# Patient Record
Sex: Male | Born: 1935
Health system: Southern US, Community
[De-identification: ages and names within clinical notes are randomized; demographics above are authoritative.]

## PROBLEM LIST (undated history)

## (undated) DIAGNOSIS — I509 Heart failure, unspecified: Secondary | ICD-10-CM

## (undated) DIAGNOSIS — I10 Essential (primary) hypertension: Secondary | ICD-10-CM

## (undated) DIAGNOSIS — I4821 Permanent atrial fibrillation: Secondary | ICD-10-CM

## (undated) DIAGNOSIS — E782 Mixed hyperlipidemia: Secondary | ICD-10-CM

## (undated) DIAGNOSIS — E119 Type 2 diabetes mellitus without complications: Secondary | ICD-10-CM

## (undated) HISTORY — PX: APPENDECTOMY: SHX54

## (undated) HISTORY — PX: INSERT / REPLACE / REMOVE PACEMAKER: SUR710

---

## 2017-01-21 ENCOUNTER — Other Ambulatory Visit
Admission: RE | Admit: 2017-01-21 | Discharge: 2017-01-21 | Disposition: A | Discharge status: Home / Self Care | Payer: Medicare HMO | Source: Ambulatory Visit | Attending: Family Medicine | Admitting: Family Medicine

## 2017-01-21 DIAGNOSIS — I482 Chronic atrial fibrillation: Secondary | ICD-10-CM | POA: Diagnosis not present

## 2017-01-21 LAB — PROTIME-INR
INR: 2.03
PROTHROMBIN TIME: 22.8 s — AB (ref 11.4–15.2)

## 2017-03-09 DIAGNOSIS — T161XXA Foreign body in right ear, initial encounter: Secondary | ICD-10-CM | POA: Diagnosis not present

## 2017-03-09 DIAGNOSIS — T169XXA Foreign body in ear, unspecified ear, initial encounter: Secondary | ICD-10-CM | POA: Diagnosis not present

## 2017-03-09 DIAGNOSIS — T162XXA Foreign body in left ear, initial encounter: Secondary | ICD-10-CM | POA: Diagnosis not present

## 2017-03-19 DIAGNOSIS — I509 Heart failure, unspecified: Secondary | ICD-10-CM | POA: Diagnosis not present

## 2017-03-19 DIAGNOSIS — I10 Essential (primary) hypertension: Secondary | ICD-10-CM | POA: Diagnosis not present

## 2017-03-19 DIAGNOSIS — I110 Hypertensive heart disease with heart failure: Secondary | ICD-10-CM | POA: Diagnosis not present

## 2017-03-19 DIAGNOSIS — E1165 Type 2 diabetes mellitus with hyperglycemia: Secondary | ICD-10-CM | POA: Diagnosis not present

## 2017-03-19 DIAGNOSIS — I4892 Unspecified atrial flutter: Secondary | ICD-10-CM | POA: Diagnosis not present

## 2017-03-19 DIAGNOSIS — M1990 Unspecified osteoarthritis, unspecified site: Secondary | ICD-10-CM | POA: Diagnosis not present

## 2017-03-19 DIAGNOSIS — I4891 Unspecified atrial fibrillation: Secondary | ICD-10-CM | POA: Diagnosis not present

## 2017-03-19 DIAGNOSIS — E782 Mixed hyperlipidemia: Secondary | ICD-10-CM | POA: Diagnosis not present

## 2017-03-25 DIAGNOSIS — I4892 Unspecified atrial flutter: Secondary | ICD-10-CM | POA: Diagnosis not present

## 2017-03-25 DIAGNOSIS — I4891 Unspecified atrial fibrillation: Secondary | ICD-10-CM | POA: Diagnosis not present

## 2017-04-05 DIAGNOSIS — M79675 Pain in left toe(s): Secondary | ICD-10-CM | POA: Diagnosis not present

## 2017-04-05 DIAGNOSIS — I4891 Unspecified atrial fibrillation: Secondary | ICD-10-CM | POA: Diagnosis not present

## 2017-04-05 DIAGNOSIS — M79674 Pain in right toe(s): Secondary | ICD-10-CM | POA: Diagnosis not present

## 2017-04-05 DIAGNOSIS — B351 Tinea unguium: Secondary | ICD-10-CM | POA: Diagnosis not present

## 2017-04-05 DIAGNOSIS — L600 Ingrowing nail: Secondary | ICD-10-CM | POA: Diagnosis not present

## 2017-04-05 DIAGNOSIS — I4892 Unspecified atrial flutter: Secondary | ICD-10-CM | POA: Diagnosis not present

## 2017-04-05 DIAGNOSIS — E119 Type 2 diabetes mellitus without complications: Secondary | ICD-10-CM | POA: Diagnosis not present

## 2017-05-06 DIAGNOSIS — I4892 Unspecified atrial flutter: Secondary | ICD-10-CM | POA: Diagnosis not present

## 2017-05-06 DIAGNOSIS — I4891 Unspecified atrial fibrillation: Secondary | ICD-10-CM | POA: Diagnosis not present

## 2017-06-16 DIAGNOSIS — E1165 Type 2 diabetes mellitus with hyperglycemia: Secondary | ICD-10-CM | POA: Diagnosis not present

## 2017-06-16 DIAGNOSIS — I4891 Unspecified atrial fibrillation: Secondary | ICD-10-CM | POA: Diagnosis not present

## 2017-06-16 DIAGNOSIS — I4892 Unspecified atrial flutter: Secondary | ICD-10-CM | POA: Diagnosis not present

## 2017-06-23 DIAGNOSIS — I10 Essential (primary) hypertension: Secondary | ICD-10-CM | POA: Diagnosis not present

## 2017-06-23 DIAGNOSIS — E1165 Type 2 diabetes mellitus with hyperglycemia: Secondary | ICD-10-CM | POA: Diagnosis not present

## 2017-06-23 DIAGNOSIS — I4892 Unspecified atrial flutter: Secondary | ICD-10-CM | POA: Diagnosis not present

## 2017-06-23 DIAGNOSIS — Z0000 Encounter for general adult medical examination without abnormal findings: Secondary | ICD-10-CM | POA: Diagnosis not present

## 2017-06-23 DIAGNOSIS — E782 Mixed hyperlipidemia: Secondary | ICD-10-CM | POA: Diagnosis not present

## 2017-06-23 DIAGNOSIS — I4891 Unspecified atrial fibrillation: Secondary | ICD-10-CM | POA: Diagnosis not present

## 2017-07-19 DIAGNOSIS — I4891 Unspecified atrial fibrillation: Secondary | ICD-10-CM | POA: Diagnosis not present

## 2017-07-19 DIAGNOSIS — I4892 Unspecified atrial flutter: Secondary | ICD-10-CM | POA: Diagnosis not present

## 2017-08-26 DIAGNOSIS — M79675 Pain in left toe(s): Secondary | ICD-10-CM | POA: Diagnosis not present

## 2017-08-26 DIAGNOSIS — I4891 Unspecified atrial fibrillation: Secondary | ICD-10-CM | POA: Diagnosis not present

## 2017-08-26 DIAGNOSIS — B351 Tinea unguium: Secondary | ICD-10-CM | POA: Diagnosis not present

## 2017-08-26 DIAGNOSIS — I4892 Unspecified atrial flutter: Secondary | ICD-10-CM | POA: Diagnosis not present

## 2017-08-26 DIAGNOSIS — M79674 Pain in right toe(s): Secondary | ICD-10-CM | POA: Diagnosis not present

## 2017-09-06 DIAGNOSIS — I4892 Unspecified atrial flutter: Secondary | ICD-10-CM | POA: Diagnosis not present

## 2017-09-06 DIAGNOSIS — I4891 Unspecified atrial fibrillation: Secondary | ICD-10-CM | POA: Diagnosis not present

## 2017-09-30 DIAGNOSIS — E1165 Type 2 diabetes mellitus with hyperglycemia: Secondary | ICD-10-CM | POA: Diagnosis not present

## 2017-10-06 DIAGNOSIS — E782 Mixed hyperlipidemia: Secondary | ICD-10-CM | POA: Diagnosis not present

## 2017-10-06 DIAGNOSIS — I10 Essential (primary) hypertension: Secondary | ICD-10-CM | POA: Diagnosis not present

## 2017-10-06 DIAGNOSIS — I4892 Unspecified atrial flutter: Secondary | ICD-10-CM | POA: Diagnosis not present

## 2017-10-06 DIAGNOSIS — I4891 Unspecified atrial fibrillation: Secondary | ICD-10-CM | POA: Diagnosis not present

## 2017-10-06 DIAGNOSIS — E1165 Type 2 diabetes mellitus with hyperglycemia: Secondary | ICD-10-CM | POA: Diagnosis not present

## 2017-10-06 DIAGNOSIS — R001 Bradycardia, unspecified: Secondary | ICD-10-CM | POA: Diagnosis not present

## 2017-10-11 DIAGNOSIS — I4892 Unspecified atrial flutter: Secondary | ICD-10-CM | POA: Diagnosis not present

## 2017-10-11 DIAGNOSIS — I4891 Unspecified atrial fibrillation: Secondary | ICD-10-CM | POA: Diagnosis not present

## 2017-10-11 DIAGNOSIS — I10 Essential (primary) hypertension: Secondary | ICD-10-CM | POA: Diagnosis not present

## 2017-10-11 DIAGNOSIS — R001 Bradycardia, unspecified: Secondary | ICD-10-CM | POA: Diagnosis not present

## 2017-10-25 DIAGNOSIS — R001 Bradycardia, unspecified: Secondary | ICD-10-CM | POA: Diagnosis not present

## 2017-10-25 DIAGNOSIS — I10 Essential (primary) hypertension: Secondary | ICD-10-CM | POA: Diagnosis not present

## 2017-10-25 DIAGNOSIS — E1165 Type 2 diabetes mellitus with hyperglycemia: Secondary | ICD-10-CM | POA: Diagnosis not present

## 2017-10-25 DIAGNOSIS — E782 Mixed hyperlipidemia: Secondary | ICD-10-CM | POA: Diagnosis not present

## 2017-10-25 DIAGNOSIS — I4892 Unspecified atrial flutter: Secondary | ICD-10-CM | POA: Diagnosis not present

## 2017-10-25 DIAGNOSIS — I4891 Unspecified atrial fibrillation: Secondary | ICD-10-CM | POA: Diagnosis not present

## 2017-11-11 DIAGNOSIS — I4891 Unspecified atrial fibrillation: Secondary | ICD-10-CM | POA: Diagnosis not present

## 2017-11-11 DIAGNOSIS — I4892 Unspecified atrial flutter: Secondary | ICD-10-CM | POA: Diagnosis not present

## 2017-11-15 DIAGNOSIS — I4892 Unspecified atrial flutter: Secondary | ICD-10-CM | POA: Diagnosis not present

## 2017-11-15 DIAGNOSIS — I4891 Unspecified atrial fibrillation: Secondary | ICD-10-CM | POA: Diagnosis not present

## 2017-12-08 DIAGNOSIS — H524 Presbyopia: Secondary | ICD-10-CM | POA: Diagnosis not present

## 2017-12-16 DIAGNOSIS — I4892 Unspecified atrial flutter: Secondary | ICD-10-CM | POA: Diagnosis not present

## 2017-12-16 DIAGNOSIS — I4891 Unspecified atrial fibrillation: Secondary | ICD-10-CM | POA: Diagnosis not present

## 2017-12-17 DIAGNOSIS — I4892 Unspecified atrial flutter: Secondary | ICD-10-CM | POA: Diagnosis not present

## 2017-12-17 DIAGNOSIS — I4891 Unspecified atrial fibrillation: Secondary | ICD-10-CM | POA: Diagnosis not present

## 2017-12-20 DIAGNOSIS — I4891 Unspecified atrial fibrillation: Secondary | ICD-10-CM | POA: Diagnosis not present

## 2017-12-20 DIAGNOSIS — I4892 Unspecified atrial flutter: Secondary | ICD-10-CM | POA: Diagnosis not present

## 2018-01-03 DIAGNOSIS — I4892 Unspecified atrial flutter: Secondary | ICD-10-CM | POA: Diagnosis not present

## 2018-01-03 DIAGNOSIS — E1165 Type 2 diabetes mellitus with hyperglycemia: Secondary | ICD-10-CM | POA: Diagnosis not present

## 2018-01-03 DIAGNOSIS — I4891 Unspecified atrial fibrillation: Secondary | ICD-10-CM | POA: Diagnosis not present

## 2018-01-07 DIAGNOSIS — I4891 Unspecified atrial fibrillation: Secondary | ICD-10-CM | POA: Diagnosis not present

## 2018-01-07 DIAGNOSIS — I4892 Unspecified atrial flutter: Secondary | ICD-10-CM | POA: Diagnosis not present

## 2018-01-13 DIAGNOSIS — I10 Essential (primary) hypertension: Secondary | ICD-10-CM | POA: Diagnosis not present

## 2018-01-13 DIAGNOSIS — E782 Mixed hyperlipidemia: Secondary | ICD-10-CM | POA: Diagnosis not present

## 2018-01-13 DIAGNOSIS — R413 Other amnesia: Secondary | ICD-10-CM | POA: Diagnosis not present

## 2018-01-13 DIAGNOSIS — I4891 Unspecified atrial fibrillation: Secondary | ICD-10-CM | POA: Diagnosis not present

## 2018-01-13 DIAGNOSIS — M1990 Unspecified osteoarthritis, unspecified site: Secondary | ICD-10-CM | POA: Diagnosis not present

## 2018-01-13 DIAGNOSIS — E1165 Type 2 diabetes mellitus with hyperglycemia: Secondary | ICD-10-CM | POA: Diagnosis not present

## 2018-01-13 DIAGNOSIS — I4892 Unspecified atrial flutter: Secondary | ICD-10-CM | POA: Diagnosis not present

## 2018-01-17 DIAGNOSIS — I4892 Unspecified atrial flutter: Secondary | ICD-10-CM | POA: Diagnosis not present

## 2018-01-17 DIAGNOSIS — I4891 Unspecified atrial fibrillation: Secondary | ICD-10-CM | POA: Diagnosis not present

## 2018-02-18 DIAGNOSIS — R791 Abnormal coagulation profile: Secondary | ICD-10-CM | POA: Diagnosis not present

## 2018-03-21 DIAGNOSIS — I4892 Unspecified atrial flutter: Secondary | ICD-10-CM | POA: Diagnosis not present

## 2018-03-21 DIAGNOSIS — I4891 Unspecified atrial fibrillation: Secondary | ICD-10-CM | POA: Diagnosis not present

## 2018-03-23 ENCOUNTER — Emergency Department: Payer: Medicare HMO

## 2018-03-23 ENCOUNTER — Other Ambulatory Visit: Payer: Self-pay

## 2018-03-23 ENCOUNTER — Inpatient Hospital Stay
Admission: EM | Admit: 2018-03-23 | Discharge: 2018-03-25 | DRG: 308 | Disposition: A | Discharge status: Home Health | Payer: Medicare HMO | Attending: Internal Medicine | Admitting: Internal Medicine

## 2018-03-23 ENCOUNTER — Encounter: Payer: Self-pay | Admitting: Emergency Medicine

## 2018-03-23 DIAGNOSIS — E119 Type 2 diabetes mellitus without complications: Secondary | ICD-10-CM | POA: Diagnosis not present

## 2018-03-23 DIAGNOSIS — I509 Heart failure, unspecified: Secondary | ICD-10-CM | POA: Diagnosis not present

## 2018-03-23 DIAGNOSIS — I10 Essential (primary) hypertension: Secondary | ICD-10-CM | POA: Diagnosis not present

## 2018-03-23 DIAGNOSIS — Z7984 Long term (current) use of oral hypoglycemic drugs: Secondary | ICD-10-CM

## 2018-03-23 DIAGNOSIS — I5033 Acute on chronic diastolic (congestive) heart failure: Secondary | ICD-10-CM | POA: Diagnosis present

## 2018-03-23 DIAGNOSIS — I4821 Permanent atrial fibrillation: Secondary | ICD-10-CM | POA: Diagnosis not present

## 2018-03-23 DIAGNOSIS — I110 Hypertensive heart disease with heart failure: Secondary | ICD-10-CM | POA: Diagnosis not present

## 2018-03-23 DIAGNOSIS — Z823 Family history of stroke: Secondary | ICD-10-CM | POA: Diagnosis not present

## 2018-03-23 DIAGNOSIS — I442 Atrioventricular block, complete: Principal | ICD-10-CM | POA: Diagnosis present

## 2018-03-23 DIAGNOSIS — E782 Mixed hyperlipidemia: Secondary | ICD-10-CM | POA: Diagnosis not present

## 2018-03-23 DIAGNOSIS — N400 Enlarged prostate without lower urinary tract symptoms: Secondary | ICD-10-CM | POA: Diagnosis present

## 2018-03-23 DIAGNOSIS — I248 Other forms of acute ischemic heart disease: Secondary | ICD-10-CM | POA: Diagnosis not present

## 2018-03-23 DIAGNOSIS — Z9181 History of falling: Secondary | ICD-10-CM | POA: Diagnosis not present

## 2018-03-23 DIAGNOSIS — Z7901 Long term (current) use of anticoagulants: Secondary | ICD-10-CM | POA: Diagnosis not present

## 2018-03-23 DIAGNOSIS — R001 Bradycardia, unspecified: Secondary | ICD-10-CM

## 2018-03-23 DIAGNOSIS — I4510 Unspecified right bundle-branch block: Secondary | ICD-10-CM | POA: Diagnosis not present

## 2018-03-23 DIAGNOSIS — I455 Other specified heart block: Secondary | ICD-10-CM | POA: Diagnosis not present

## 2018-03-23 DIAGNOSIS — R0602 Shortness of breath: Secondary | ICD-10-CM | POA: Diagnosis not present

## 2018-03-23 DIAGNOSIS — I4892 Unspecified atrial flutter: Secondary | ICD-10-CM | POA: Diagnosis not present

## 2018-03-23 DIAGNOSIS — Z79899 Other long term (current) drug therapy: Secondary | ICD-10-CM

## 2018-03-23 DIAGNOSIS — Z8052 Family history of malignant neoplasm of bladder: Secondary | ICD-10-CM

## 2018-03-23 DIAGNOSIS — I4891 Unspecified atrial fibrillation: Secondary | ICD-10-CM | POA: Diagnosis not present

## 2018-03-23 HISTORY — DX: Heart failure, unspecified: I50.9

## 2018-03-23 HISTORY — DX: Type 2 diabetes mellitus without complications: E11.9

## 2018-03-23 HISTORY — DX: Essential (primary) hypertension: I10

## 2018-03-23 HISTORY — DX: Permanent atrial fibrillation: I48.21

## 2018-03-23 HISTORY — DX: Mixed hyperlipidemia: E78.2

## 2018-03-23 LAB — BRAIN NATRIURETIC PEPTIDE: B Natriuretic Peptide: 314 pg/mL — ABNORMAL HIGH (ref 0.0–100.0)

## 2018-03-23 LAB — CBC
HEMATOCRIT: 37 % — AB (ref 39.0–52.0)
HEMOGLOBIN: 11.8 g/dL — AB (ref 13.0–17.0)
MCH: 31.9 pg (ref 26.0–34.0)
MCHC: 31.9 g/dL (ref 30.0–36.0)
MCV: 100 fL (ref 80.0–100.0)
Platelets: 91 10*3/uL — ABNORMAL LOW (ref 150–400)
RBC: 3.7 MIL/uL — ABNORMAL LOW (ref 4.22–5.81)
RDW: 14.9 % (ref 11.5–15.5)
WBC: 6.5 10*3/uL (ref 4.0–10.5)
nRBC: 0 % (ref 0.0–0.2)

## 2018-03-23 LAB — BASIC METABOLIC PANEL
Anion gap: 10 (ref 5–15)
BUN: 26 mg/dL — AB (ref 8–23)
CHLORIDE: 105 mmol/L (ref 98–111)
CO2: 25 mmol/L (ref 22–32)
Calcium: 9.2 mg/dL (ref 8.9–10.3)
Creatinine, Ser: 0.99 mg/dL (ref 0.61–1.24)
GFR calc Af Amer: >60 mL/min (ref >60)
GLUCOSE: 295 mg/dL — AB (ref 70–99)
POTASSIUM: 4.1 mmol/L (ref 3.5–5.1)
Sodium: 140 mmol/L (ref 135–145)

## 2018-03-23 LAB — INFLUENZA PANEL BY PCR (TYPE A & B)
INFLBPCR: NEGATIVE
Influenza A By PCR: NEGATIVE

## 2018-03-23 LAB — TROPONIN I: Troponin I: 0.06 ng/mL (ref <0.03)

## 2018-03-23 LAB — TSH: TSH: 2.249 u[IU]/mL (ref 0.350–4.500)

## 2018-03-23 LAB — GLUCOSE, CAPILLARY: Glucose-Capillary: 257 mg/dL — ABNORMAL HIGH (ref 70–99)

## 2018-03-23 LAB — PROTIME-INR
INR: 2.49
PROTHROMBIN TIME: 26.6 s — AB (ref 11.4–15.2)

## 2018-03-23 MED ORDER — WARFARIN SODIUM 2 MG PO TABS
2.0000 mg | ORAL_TABLET | ORAL | Status: DC
  Filled 2018-03-23: qty 1

## 2018-03-23 MED ORDER — INSULIN ASPART 100 UNIT/ML ~~LOC~~ SOLN
0.0000 [IU] | Freq: Every day | SUBCUTANEOUS | Status: DC
  Administered 2018-03-23: 3 [IU] via SUBCUTANEOUS
  Administered 2018-03-24: 2 [IU] via SUBCUTANEOUS
  Filled 2018-03-23 (×2): qty 1

## 2018-03-23 MED ORDER — WARFARIN SODIUM 1 MG PO TABS: 1.0000 mg | ORAL_TABLET | ORAL | Status: DC

## 2018-03-23 MED ORDER — FUROSEMIDE 10 MG/ML IJ SOLN
40.0000 mg | Freq: Once | INTRAMUSCULAR | Status: AC
  Administered 2018-03-23: 40 mg via INTRAVENOUS
  Filled 2018-03-23: qty 4

## 2018-03-23 MED ORDER — INSULIN ASPART 100 UNIT/ML ~~LOC~~ SOLN
0.0000 [IU] | Freq: Three times a day (TID) | SUBCUTANEOUS | Status: DC
  Administered 2018-03-24: 2 [IU] via SUBCUTANEOUS
  Administered 2018-03-24: 3 [IU] via SUBCUTANEOUS
  Administered 2018-03-24: 1 [IU] via SUBCUTANEOUS
  Filled 2018-03-23 (×3): qty 1

## 2018-03-23 NOTE — ED Notes (Signed)
First nurse note: pt referred to ED by Dr. Stasia Cavalier office for new-onset CHF and HR 34 on EKG. EKG from [facility] showing idioventricular rhythm.

## 2018-03-23 NOTE — ED Notes (Signed)
Pt denies CP/dizziness. Per pt "I feel short of breath when I talk". Family a bedside.

## 2018-03-23 NOTE — Progress Notes (Signed)
Family Meeting Note  Advance Directive:yes  Today a meeting took place with the Patient.    The following clinical team members were present during this meeting:MD  The following were discussed:Patient's diagnosis: Acute CHF, sinus bradycardia with idioventricular rhythm, elevated troponin, atrial fibrillation on Coumadin, diabetes mellitus, recent history of ankle fracture on the right side status post cast, will be admitted to the hospital and treatment plan of care discussed in detail with the patient.  He verbalized understanding.    Patient's progosis: Unable to determine and Goals for treatment: Full Code  Additional follow-up to be provided: Hospitalist, cardiology  Time spent during discussion:17 min  Ramonita Lab, MD

## 2018-03-23 NOTE — ED Notes (Addendum)
Patient denies pain and is resting comfortably. Pt SpO2 at 9%. Per pt, he doesn't use oxygen at home.

## 2018-03-23 NOTE — ED Triage Notes (Signed)
PT arrives with wife after being sent by [facility] due to abnormal EKG. Pt states he went to [facility] due to shortness of breath for the last 2 days. Room air saturation 95%.

## 2018-03-23 NOTE — ED Provider Notes (Signed)
[facility] Emergency Department Provider Note ____________________________________________   First MD Initiated Contact with Patient 03/23/18 1742     (approximate)  I have reviewed the triage vital signs and the nursing notes.   HISTORY  Chief Complaint Shortness of Breath    HPI Andrew [name] is a 83 y.o. male with PMH as noted below who presents with shortness of breath, gradual onset over the last several days, worsening course, and worse with any exertion.  He denies associated chest pain, cough, or fever.  The patient reports bilateral lower extremity swelling but states that this is chronic.  He states he is compliant with all of his medications.  He went to the outpatient clinic and was referred to the ED for further evaluation.   Past Medical History:  Diagnosis Date  . CHF (congestive heart failure) (HCC)   . Diabetes mellitus without complication (HCC)     There are no active problems to display for this patient.    Prior to Admission medications   Medication Sig Start Date End Date Taking? Authorizing Provider  amLODipine (NORVASC) 2.5 MG tablet Take 2.5 mg by mouth daily. 01/06/18 01/06/19 Yes [provider]  carvedilol (COREG) 25 MG tablet Take 25 mg by mouth 2 (two) times daily with a meal.  10/25/17 10/25/18 Yes [provider]  doxazosin (CARDURA) 4 MG tablet Take 4 mg by mouth Nightly. 12/31/16  Yes [provider]  furosemide (LASIX) 80 MG tablet Take 80 mg by mouth daily. 06/29/17  Yes [provider]  glimepiride (AMARYL) 2 MG tablet Take 4 mg by mouth daily. 08/14/16  Yes [provider]  metFORMIN (GLUCOPHAGE) 1000 MG tablet Take 1,000 mg by mouth 2 (two) times daily. 04/20/17  Yes [provider]  Potassium 99 MG TABS Take 148 mg by mouth daily.   Yes [provider]  simvastatin (ZOCOR) 20 MG tablet Take 20 mg by mouth Nightly. 12/31/16  Yes [provider]    vitamin B-12 (CYANOCOBALAMIN) 1000 MCG tablet Take 1,000 mcg by mouth daily.   Yes [provider]  warfarin (COUMADIN) 2 MG tablet Take 1-2 mg by mouth daily. Monday,Tuesday, Wednesday, Thursday, Friday take 2 mg, on Saturday and Sunday take 1 mg 10/23/16  Yes [provider]  diphenhydrAMINE (BENADRYL) 25 mg capsule Take 25 mg by mouth as needed for itching.    [provider]    Allergies Methyldopa  No family history on file.  Social History Social History   Tobacco Use  . Smoking status: Not on file  Substance Use Topics  . Alcohol use: Not on file  . Drug use: Not on file    Review of Systems  Constitutional: No fever. Eyes: No redness. ENT: No sore throat. Cardiovascular: Denies chest pain. Respiratory: Positive for shortness of breath. Gastrointestinal: No vomiting or diarrhea.  Genitourinary: Negative for dysuria.  Musculoskeletal: Negative for back pain. Skin: Negative for rash. Neurological: Negative for headache.   ____________________________________________   PHYSICAL EXAM:  VITAL SIGNS: ED Triage Vitals [03/23/18 1701]  Enc Vitals Group     BP (!) 184/50     Pulse Rate (!) 48     Resp 18     Temp (!) 97.5 F (36.4 C)     Temp Source Oral     SpO2 98 %     Weight 180 lb (81.6 kg)     Height 5\' 10"  (1.778 m)     Head Circumference  Peak Flow      Pain Score 0     Pain Loc      Pain Edu?      Excl. in GC?     Constitutional: Alert and oriented.  Slightly uncomfortable appearing with increased work of breathing but in no acute distress. Eyes: Conjunctivae are normal.  Head: Atraumatic. Nose: No congestion/rhinnorhea. Mouth/Throat: Mucous membranes are moist.   Neck: Normal range of motion.  Cardiovascular: Bradycardic, irregular rhythm. Grossly normal heart sounds.  Good peripheral circulation. Respiratory: Increased respiratory effort.  Somewhat decreased breath sounds bilaterally.  No rales or  rhonchi. Gastrointestinal: Soft and nontender. No distention.  Genitourinary: No flank tenderness. Musculoskeletal: Trace bilateral lower extremity edema.  Extremities warm and well perfused.  Neurologic:  Normal speech and language. No gross focal neurologic deficits are appreciated.  Skin:  Skin is warm and dry. No rash noted. Psychiatric: Mood and affect are normal. Speech and behavior are normal.  ____________________________________________   LABS (all labs ordered are listed, but only abnormal results are displayed)  Labs Reviewed  BASIC METABOLIC PANEL - Abnormal; Notable for the following components:      Result Value   Glucose, Bld 295 (*)    BUN 26 (*)    All other components within normal limits  CBC - Abnormal; Notable for the following components:   RBC 3.70 (*)    Hemoglobin 11.8 (*)    HCT 37.0 (*)    Platelets 91 (*)    All other components within normal limits  TROPONIN I - Abnormal; Notable for the following components:   Troponin I 0.06 (*)    All other components within normal limits  BRAIN NATRIURETIC PEPTIDE - Abnormal; Notable for the following components:   B Natriuretic Peptide 314.0 (*)    All other components within normal limits  TSH  INFLUENZA PANEL BY PCR (TYPE A & B)  PROTIME-INR   ____________________________________________  EKG  ED ECG REPORT I, [name] Bucy, the attending physician, personally viewed and interpreted this ECG.  Date: 03/23/2018 EKG Time: 1706 Rate: 36 Rhythm: Junctional rhythm Intervals: RBBB ST/T Wave abnormalities: normal Narrative Interpretation: Junctional bradycardia with no evidence of acute ischemia  ____________________________________________  RADIOLOGY  CXR: Vascular congestion with no overt edema  ____________________________________________   PROCEDURES  Procedure(s) performed: No  Procedures  Critical Care performed: Yes  CRITICAL CARE Performed by: [name] Bucy   Total  critical care time: 40 minutes  Critical care time was exclusive of separately billable procedures and treating other patients.  Critical care was necessary to treat or prevent imminent or life-threatening deterioration.  Critical care was time spent personally by me on the following activities: development of treatment plan with patient and/or surrogate as well as nursing, discussions with consultants, evaluation of patient's response to treatment, examination of patient, obtaining history from patient or surrogate, ordering and performing treatments and interventions, ordering and review of laboratory studies, ordering and review of radiographic studies, pulse oximetry and re-evaluation of patient's condition.  ____________________________________________   INITIAL IMPRESSION / ASSESSMENT AND PLAN / ED COURSE  Pertinent labs & imaging results that were available during my care of the patient were reviewed by me and considered in my medical decision making (see chart for details).  83 year old male with a history of CHF and diabetes presents with worsening shortness of breath over the last several days with no chest pain, cough, or fever.  The patient was seen at the outpatient clinic and referred into the ED.  I reviewed the past medical records in Epic.  I am able to see a note indicating the patient was seen today but not any clinical information and I do not have access in Epic or Care Everywhere to the x-ray obtained today.  On exam the patient does have increased work of breathing but no acute distress.  O2 saturation is in the low to mid 90s on room air.  He is significantly bradycardic to the 30s-40s but with elevated blood pressure.  The remainder of the exam is as described above.  Overall presentation is most consistent with acute on chronic CHF although differential also includes influenza or other viral syndrome pneumonia, or ACS.  EKG shows junctional bradycardia, and this would  be a possible contributor to the patient's acute CHF.  However at this time the patient is maintaining a blood pressure and his mental status is normal.  There is no indication for pacing.  We will obtain lab work-up, a repeat chest x-ray, and reassess.  ----------------------------------------- 8:25 PM on 03/23/2018 -----------------------------------------  The troponin is minimally elevated.  BNP is also slightly elevated.  The remainder of the lab work-up is relatively unremarkable.  The patient's vital signs are essentially unchanged, with persistent bradycardia but elevated blood pressure.  I consulted Dr. Eden Emms from cardiology.  He recommended that we hold the patient's beta-blocker and continue monitoring.  He advised that cardiology would evaluate the patient in the hospital and that the patient would likely need an echocardiogram tomorrow morning.  The patient agrees with the plan and remains comfortable.  I signed him out to the hospitalist Dr. Amado Coe.  ____________________________________________   FINAL CLINICAL IMPRESSION(S) / ED DIAGNOSES  Final diagnoses:  Junctional bradycardia  Acute on chronic congestive heart failure, unspecified heart failure type (HCC)      NEW MEDICATIONS STARTED DURING THIS VISIT:  New Prescriptions   No medications on file     Note:  This document was prepared using Dragon voice recognition software and may include unintentional dictation errors.    [name] Bucy, MD 03/23/18 2026

## 2018-03-23 NOTE — H&P (Signed)
[facility] [place] [facility]   PATIENT NAME: Andrew [name]    MR#:  [id number]  DATE OF BIRTH:  14-Jan-1936  DATE OF ADMISSION:  03/23/2018  PRIMARY CARE PHYSICIAN: [facility]   REQUESTING/REFERRING PHYSICIAN: Dionne Bucy, MD  CHIEF COMPLAINT:  Shortness of breath  HISTORY OF PRESENT ILLNESS:  Andrew [name]  is a 83 y.o. male with a known history of CHF, diabetes mellitus, history of atrial fibrillation on Coumadin, recent history of ankle fracture on the right side on cast is presenting to the ED with a chief complaint of shortness of breath.  Patient's EKG has revealed idioventricular rhythm with heart rate at 36.  Chest x-ray with vascular congestion BNP is elevated at 314 troponin is 0.06 patient takes Coumadin and INR is therapeutic.  Patient denies any chest pain.  Patient initially went outpatient clinic who referred him to emergency department.  No family members at bedside  PAST MEDICAL HISTORY:   Past Medical History:  Diagnosis Date  . CHF (congestive heart failure) (HCC)   . Diabetes mellitus without complication (HCC)   Chronic atrial fibrillation on Coumadin Anxiety  PAST SURGICAL HISTOIRY:   Ankle fracture SOCIAL HISTORY:   Social History   Tobacco Use  . Smoking status: Not on file  Substance Use Topics  . Alcohol use: Not on file    FAMILY HISTORY:  No family history on file.  DRUG ALLERGIES:   Allergies  Allergen Reactions  . Methyldopa Nausea Only and Palpitations    Other reaction(s): Unknown Other reaction(s): NAUSEA 'palpitation" Other reaction(s): NAUSEA Other reaction(s): NAUSEA     REVIEW OF SYSTEMS:  CONSTITUTIONAL: No fever, fatigue or weakness.  EYES: No blurred or double vision.  EARS, NOSE, AND THROAT: No tinnitus or ear pain.  RESPIRATORY: No cough, endorses shortness of breath, denies wheezing or hemoptysis.  CARDIOVASCULAR: No chest pain, orthopnea, edema.   GASTROINTESTINAL: No nausea, vomiting, diarrhea or abdominal pain.  GENITOURINARY: No dysuria, hematuria.  ENDOCRINE: No polyuria, nocturia,  HEMATOLOGY: No anemia, easy bruising or bleeding SKIN: No rash or lesion. MUSCULOSKELETAL: No joint pain or arthritis.   NEUROLOGIC: No tingling, numbness, weakness.  PSYCHIATRY: No anxiety or depression.   MEDICATIONS AT HOME:   Prior to Admission medications   Medication Sig Start Date End Date Taking? Authorizing Provider  amLODipine (NORVASC) 2.5 MG tablet Take 2.5 mg by mouth daily. 01/06/18 01/06/19 Yes [provider]  carvedilol (COREG) 25 MG tablet Take 25 mg by mouth 2 (two) times daily with a meal.  10/25/17 10/25/18 Yes [provider]  doxazosin (CARDURA) 4 MG tablet Take 4 mg by mouth Nightly. 12/31/16  Yes [provider]  furosemide (LASIX) 80 MG tablet Take 80 mg by mouth daily. 06/29/17  Yes [provider]  glimepiride (AMARYL) 2 MG tablet Take 4 mg by mouth daily. 08/14/16  Yes [provider]  metFORMIN (GLUCOPHAGE) 1000 MG tablet Take 1,000 mg by mouth 2 (two) times daily. 04/20/17  Yes [provider]  Potassium 99 MG TABS Take 148 mg by mouth daily.   Yes [provider]  simvastatin (ZOCOR) 20 MG tablet Take 20 mg by mouth Nightly. 12/31/16  Yes [provider]  vitamin B-12 (CYANOCOBALAMIN) 1000 MCG tablet Take 1,000 mcg by mouth daily.   Yes [provider]  warfarin (COUMADIN) 2 MG tablet Take 1-2 mg by mouth daily. Monday,Tuesday, Wednesday, Thursday, Friday take 2 mg, on Saturday and Sunday take 1 mg 10/23/16  Yes [provider]  diphenhydrAMINE (BENADRYL) 25 mg capsule Take 25 mg by mouth as needed for itching.    [provider]      VITAL SIGNS:  Blood pressure (!) 168/54, pulse (!) 44, temperature (!) 97.5 F (36.4 C), temperature source Oral, resp. rate 19, height 5\' 10"  (1.778 m), weight 81.6 kg, SpO2 99 %.  PHYSICAL  EXAMINATION:  GENERAL:  83 y.o.-year-old patient lying in the bed with no acute distress.  EYES: Pupils equal, round, reactive to light and accommodation. No scleral icterus. Extraocular muscles intact.  HEENT: Head atraumatic, normocephalic. Oropharynx and nasopharynx clear.  NECK:  Supple, no jugular venous distention. No thyroid enlargement, no tenderness.  LUNGS: Normal breath sounds bilaterally, no wheezing,rhonchi or crepitation. No use of accessory muscles of respiration.  Positive rales CARDIOVASCULAR: S1, S2 normal. No murmurs, rubs, or gallops.  ABDOMEN: Soft, nontender, nondistended. Bowel sounds present. No organomegaly or mass.  EXTREMITIES: No pedal edema, cyanosis, or clubbing.  Right lower extremity in a cast for ankle fracture NEUROLOGIC: Awake alert and oriented x3 sensation intact. Gait not checked.  PSYCHIATRIC: The patient is alert and oriented x 3.  SKIN: No obvious rash, lesion, or ulcer.   LABORATORY PANEL:   CBC Recent Labs  [name] 03/23/18 1727  WBC 6.5  HGB 11.8*  HCT 37.0*  PLT 91*   ------------------------------------------------------------------------------------------------------------------  Chemistries  Recent Labs  [name] 03/23/18 1727  NA 140  K 4.1  CL 105  CO2 25  GLUCOSE 295*  BUN 26*  CREATININE 0.99  CALCIUM 9.2   ------------------------------------------------------------------------------------------------------------------  Cardiac Enzymes Recent Labs  [name] 03/23/18 1727  TROPONINI 0.06*   ------------------------------------------------------------------------------------------------------------------  RADIOLOGY:  Dg Chest Portable 1 View  Result Date: 03/23/2018 CLINICAL DATA:  New onset CHF. EXAM: PORTABLE CHEST 1 VIEW COMPARISON:  None. FINDINGS: 1843 hours. Low lung volumes accentuated by lordotic positioning. The cardio pericardial silhouette is enlarged. There is pulmonary vascular congestion without overt pulmonary  edema. No substantial pleural effusion or focal airspace consolidation. The visualized bony structures of the thorax are intact. Telemetry leads overlie the chest. IMPRESSION: Low volumes with cardiomegaly and vascular congestion. No overt pulmonary edema. Electronically Signed   By: Kennith Center M.D.   On: 03/23/2018 19:01    EKG:   Orders placed or performed during the hospital encounter of 03/23/18  . ED EKG  . ED EKG  . EKG 12-Lead  . EKG 12-Lead    IMPRESSION AND PLAN:     #Acute CHF Admit to telemetry BNP is elevated and a chest x-ray with vascular congestion Lasix 40 mg IV every 12 hours Beta-blocker on hold in view of symptomatic bradycardia Cardiology consult placed to Dr. [facility] cardiologist Monitor daily weights, intake and output  #Idioventricular rhythm-heart rate around 35 Cardiology recommended to hold his Coreg Monitor patient on telemetry Hold other rate limiting drugs  #Elevated troponin could be demand ischemia Monitor on telemetry and cycle cardiac biomarkers  #Atrial fibrillation chronic Beta-blocker on hold in view in view of symptomatic idioventricular rhythm Coumadin management per pharmacy.  INR therapeutic at 2.49  #Diabetes mellitus Sliding scale insulin  hold metformin, continue Amaryl  #Hypertension continue home medication Cardura and Norvasc and hold Coreg  #Hyperlipidemia continue statin  DVT prophylaxis with Coumadin   All the records are reviewed and case discussed with ED provider. Management plans discussed with the patient, family and they are in agreement.  CODE STATUS: FC   TOTAL TIME TAKING CARE  OF THIS PATIENT: 43 minutes.   Note: This dictation was prepared with Dragon dictation along with smaller phrase technology. Any transcriptional errors that result from this process are unintentional.  Andrew [name] M.D on 03/23/2018 at 9:37 PM  Between 7am to 6pm - Pager - [phone]  After 6pm go  to [email or web address] - password [email or web address] [facility]  [place]  [facility]  Office  [phone]  CC: Primary care physician; Raynelle Bring

## 2018-03-23 NOTE — Progress Notes (Signed)
ANTICOAGULATION CONSULT NOTE - Initial Consult  Pharmacy Consult for warfarin dosing Indication: atrial fibrillation  Allergies  Allergen Reactions  . Methyldopa Nausea Only and Palpitations    Other reaction(s): Unknown Other reaction(s): NAUSEA 'palpitation" Other reaction(s): NAUSEA Other reaction(s): NAUSEA     Patient Measurements: Height: 5\' 10"  (177.8 cm) Weight: 180 lb (81.6 kg) IBW/kg (Calculated) : 73 Heparin Dosing Weight: n/a  Vital Signs: Temp: 97.5 F (36.4 C) (02/19 1701) Temp Source: Oral (02/19 1701) BP: 168/54 (02/19 2000) Pulse Rate: 44 (02/19 1945)  Labs: Recent Labs    03/23/18 1727 03/23/18 2023  HGB 11.8*  --   HCT 37.0*  --   PLT 91*  --   LABPROT  --  26.6*  INR  --  2.49  CREATININE 0.99  --   TROPONINI 0.06*  --     Estimated Creatinine Clearance: 61.4 mL/min (by C-G formula based on SCr of 0.99 mg/dL).   Medical History: Past Medical History:  Diagnosis Date  . CHF (congestive heart failure) (HCC)   . Diabetes mellitus without complication (HCC)     Medications:  Home dose 2 mg Monday-Friday; 1 mg Saturday and Sunday  Assessment: INR therapeutic on admission  Goal of Therapy:  INR 2-3   Plan:  Resume home regimen as above. F/u INR as clinically indicated.  Fulton Reek, PharmD, BCPS  03/23/18 9:51 PM

## 2018-03-24 ENCOUNTER — Encounter: Payer: Self-pay | Admitting: Student

## 2018-03-24 ENCOUNTER — Inpatient Hospital Stay: Admit: 2018-03-24 | Payer: Medicare HMO

## 2018-03-24 ENCOUNTER — Other Ambulatory Visit: Payer: Self-pay

## 2018-03-24 DIAGNOSIS — I248 Other forms of acute ischemic heart disease: Secondary | ICD-10-CM

## 2018-03-24 DIAGNOSIS — I442 Atrioventricular block, complete: Principal | ICD-10-CM

## 2018-03-24 DIAGNOSIS — I4821 Permanent atrial fibrillation: Secondary | ICD-10-CM

## 2018-03-24 DIAGNOSIS — I10 Essential (primary) hypertension: Secondary | ICD-10-CM

## 2018-03-24 LAB — GLUCOSE, CAPILLARY
GLUCOSE-CAPILLARY: 189 mg/dL — AB (ref 70–99)
GLUCOSE-CAPILLARY: 242 mg/dL — AB (ref 70–99)
Glucose-Capillary: 132 mg/dL — ABNORMAL HIGH (ref 70–99)
Glucose-Capillary: 245 mg/dL — ABNORMAL HIGH (ref 70–99)

## 2018-03-24 LAB — TROPONIN I
Troponin I: 0.06 ng/mL (ref <0.03)
Troponin I: 0.07 ng/mL (ref <0.03)
Troponin I: 0.07 ng/mL (ref <0.03)

## 2018-03-24 LAB — DIGOXIN LEVEL: Digoxin Level: <0.2 ng/mL — ABNORMAL LOW (ref 0.8–2.0)

## 2018-03-24 MED ORDER — AMLODIPINE BESYLATE 5 MG PO TABS: 2.5000 mg | ORAL_TABLET | Freq: Every day | ORAL | Status: DC

## 2018-03-24 MED ORDER — ACETAMINOPHEN 325 MG PO TABS: 650.0000 mg | ORAL_TABLET | ORAL | Status: DC | PRN

## 2018-03-24 MED ORDER — SODIUM CHLORIDE 0.9% FLUSH
3.0000 mL | Freq: Two times a day (BID) | INTRAVENOUS | Status: DC
  Administered 2018-03-24 (×2): 3 mL via INTRAVENOUS

## 2018-03-24 MED ORDER — CALCIUM GLUCONATE-NACL 1-0.675 GM/50ML-% IV SOLN
1.0000 g | Freq: Once | INTRAVENOUS | Status: AC
  Administered 2018-03-24: 1000 mg via INTRAVENOUS
  Filled 2018-03-24: qty 50

## 2018-03-24 MED ORDER — SODIUM CHLORIDE 0.9% FLUSH: 3.0000 mL | INTRAVENOUS | Status: DC | PRN

## 2018-03-24 MED ORDER — SIMVASTATIN 20 MG PO TABS
20.0000 mg | ORAL_TABLET | Freq: Every evening | ORAL | Status: DC
  Administered 2018-03-24: 20 mg via ORAL
  Filled 2018-03-24: qty 1

## 2018-03-24 MED ORDER — FUROSEMIDE 10 MG/ML IJ SOLN
40.0000 mg | Freq: Two times a day (BID) | INTRAMUSCULAR | Status: DC
  Administered 2018-03-24: 40 mg via INTRAVENOUS
  Filled 2018-03-24: qty 4

## 2018-03-24 MED ORDER — GLIMEPIRIDE 4 MG PO TABS
4.0000 mg | ORAL_TABLET | Freq: Every day | ORAL | Status: DC
  Administered 2018-03-24: 4 mg via ORAL
  Filled 2018-03-24 (×2): qty 1

## 2018-03-24 MED ORDER — VITAMIN B-12 1000 MCG PO TABS
1000.0000 ug | ORAL_TABLET | Freq: Every day | ORAL | Status: DC
  Administered 2018-03-24: 1000 ug via ORAL
  Filled 2018-03-24: qty 1

## 2018-03-24 MED ORDER — DOXAZOSIN MESYLATE 4 MG PO TABS
4.0000 mg | ORAL_TABLET | Freq: Every evening | ORAL | Status: DC
  Administered 2018-03-24: 4 mg via ORAL
  Filled 2018-03-24 (×2): qty 1

## 2018-03-24 MED ORDER — DIPHENHYDRAMINE HCL 25 MG PO CAPS: 25.0000 mg | ORAL_CAPSULE | ORAL | Status: DC | PRN

## 2018-03-24 MED ORDER — VITAMIN K1 10 MG/ML IJ SOLN
1.0000 mg | Freq: Once | INTRAVENOUS | Status: AC
  Administered 2018-03-24: 1 mg via INTRAVENOUS
  Filled 2018-03-24: qty 0.1

## 2018-03-24 MED ORDER — LISINOPRIL 20 MG PO TABS
20.0000 mg | ORAL_TABLET | Freq: Every day | ORAL | Status: DC
  Administered 2018-03-24 – 2018-03-25 (×2): 20 mg via ORAL
  Filled 2018-03-24 (×2): qty 1

## 2018-03-24 MED ORDER — WARFARIN SODIUM 1 MG PO TABS: 1.0000 mg | ORAL_TABLET | Freq: Every day | ORAL | Status: DC

## 2018-03-24 MED ORDER — AMLODIPINE BESYLATE 5 MG PO TABS
2.5000 mg | ORAL_TABLET | Freq: Every day | ORAL | Status: DC
  Filled 2018-03-24: qty 1

## 2018-03-24 MED ORDER — ONDANSETRON HCL 4 MG/2ML IJ SOLN: 4.0000 mg | Freq: Four times a day (QID) | INTRAMUSCULAR | Status: DC | PRN

## 2018-03-24 MED ORDER — SODIUM CHLORIDE 0.9 % IV SOLN: 250.0000 mL | INTRAVENOUS | Status: DC | PRN

## 2018-03-24 NOTE — Progress Notes (Signed)
Patient ID: Andrew [name], male   DOB: [birth year], 83 y.o.   MRN: [id number]  [employer] PROGRESS NOTE  EDGER FENNEWALD YJW:[id number] DOB: 1935/12/09 DOA: 03/23/2018 PCP: Raynelle Bring  HPI/Subjective: Patient feeling a little bit better.  Some shortness of breath.  No chest pain.  Some periods where was hard to talk.  Objective: Vitals:   03/24/18 1157 03/24/18 1602  BP: (!) 166/56 (!) 182/60  Pulse: (!) 34 (!) 35  Resp:    Temp: 98.8 F (37.1 C)   SpO2: 99% 97%    Filed Weights   03/23/18 1701  Weight: 81.6 kg    ROS: Review of Systems  Constitutional: Negative for chills and fever.  Eyes: Negative for blurred vision.  Respiratory: Positive for cough and shortness of breath.   Cardiovascular: Negative for chest pain.  Gastrointestinal: Negative for abdominal pain, constipation, diarrhea, nausea and vomiting.  Genitourinary: Negative for dysuria.  Musculoskeletal: Negative for joint pain.  Neurological: Negative for dizziness and headaches.   Exam: Physical Exam  Constitutional: He is oriented to person, place, and time.  HENT:  Nose: No mucosal edema.  Mouth/Throat: No oropharyngeal exudate or posterior oropharyngeal edema.  Eyes: Pupils are equal, round, and reactive to light. Conjunctivae, EOM and lids are normal.  Neck: No JVD present. Carotid bruit is not present. No edema present. No thyroid mass and no thyromegaly present.  Cardiovascular: S1 normal and S2 normal. Bradycardia present. Exam reveals no gallop.  No murmur heard. Pulses:      Dorsalis pedis pulses are 2+ on the right side and 2+ on the left side.  Respiratory: No respiratory distress. He has decreased breath sounds in the right lower field and the left lower field. He has no wheezes. He has no rhonchi. He has rales in the right lower field and the left lower field.  GI: Soft. Bowel sounds are normal. There is no abdominal tenderness.  Musculoskeletal:     Right ankle: He exhibits  swelling.     Left ankle: He exhibits swelling.  Lymphadenopathy:    He has no cervical adenopathy.  Neurological: He is alert and oriented to person, place, and time. No cranial nerve deficit.  Skin: Skin is warm. Nails show no clubbing.  Chronic lower extremity skin discoloration  Psychiatric: He has a normal mood and affect.      Data Reviewed: Basic Metabolic Panel: Recent Labs  Lab 03/23/18 1727  NA 140  K 4.1  CL 105  CO2 25  GLUCOSE 295*  BUN 26*  CREATININE 0.99  CALCIUM 9.2   CBC: Recent Labs  Lab 03/23/18 1727  WBC 6.5  HGB 11.8*  HCT 37.0*  MCV 100.0  PLT 91*   Cardiac Enzymes: Recent Labs  Lab 03/23/18 1727 03/24/18 0333 03/24/18 1115 03/24/18 1229  TROPONINI 0.06* 0.06* 0.07* 0.07*   BNP (last 3 results) Recent Labs    03/23/18 1727  BNP 314.0*     CBG: Recent Labs  Lab 03/23/18 2158 03/24/18 0850 03/24/18 1144 03/24/18 1601  GLUCAP 257* 132* 189* 245*     Studies: Dg Chest Portable 1 View  Result Date: 03/23/2018 CLINICAL DATA:  New onset CHF. EXAM: PORTABLE CHEST 1 VIEW COMPARISON:  None. FINDINGS: 1843 hours. Low lung volumes accentuated by lordotic positioning. The cardio pericardial silhouette is enlarged. There is pulmonary vascular congestion without overt pulmonary edema. No substantial pleural effusion or focal airspace consolidation. The visualized bony structures of the thorax are intact. Telemetry leads overlie the  chest. IMPRESSION: Low volumes with cardiomegaly and vascular congestion. No overt pulmonary edema. Electronically Signed   By: Kennith Center M.D.   On: 03/23/2018 19:01    Scheduled Meds: . doxazosin  4 mg Oral Nightly  . furosemide  40 mg Intravenous BID  . glimepiride  4 mg Oral Daily  . insulin aspart  0-5 Units Subcutaneous QHS  . insulin aspart  0-9 Units Subcutaneous TID WC  . lisinopril  20 mg Oral Daily  . simvastatin  20 mg Oral Nightly  . sodium chloride flush  3 mL Intravenous Q12H  .  vitamin B-12  1,000 mcg Oral Daily   Continuous Infusions: . sodium chloride    . phytonadione (VITAMIN K) IV      Assessment/Plan:  1. Severe bradycardia, heart block.  Holding beta-blocker.  Give a dose of calcium.  Continue to monitor on telemetry.  Cardiology following for need for pacemaker. 2. Acute CHF likely secondary to poor perfusion and slow heart rate.  Echocardiogram pending.  Continue IV Lasix twice daily. 3. Permanent atrial fibrillation on Coumadin.  Holding beta-blocker.  Heart rate on the lower side. 4. Accelerated hypertension on Lasix and lisinopril 5. Hyperlipidemia unspecified on simvastatin 6. BPH on doxazosin 7. Type 2 diabetes mellitus on sliding scale and Amaryl  Code Status:     Code Status Orders  (From admission, onward)         Start     Ordered   03/24/18 1202  Full code  Continuous     03/24/18 1201        Code Status History    This patient has a current code status but no historical code status.     Family Communication: Spoke with patient and family Disposition Plan: To be determined  Consultants: -Cardiology  Time spent: 35 minutes including ACP time  [employer]

## 2018-03-24 NOTE — Progress Notes (Signed)
Elevated BP. Dr. Renae Gloss aware. Instructed to go ahead and give evening lasix now with other scheduled meds.

## 2018-03-24 NOTE — Progress Notes (Signed)
Patient ID: [name], male   DOB: 12/21/1935, 83 y.o.   MRN: [id number]  ACP note  Patient and family at the bedside  Diagnosis: Severe bradycardia, heart block, acute CHF, permanent atrial fibrillation  CODE STATUS discussed and patient is a full code.  Plan.  Cardiology monitoring for need for pacemaker.  Continue to hold beta-blocker.  Echocardiogram.  Time spent on ACP discussion 17 minutes Dr. Alford Highland

## 2018-03-24 NOTE — ED Notes (Signed)
ED TO INPATIENT HANDOFF REPORT  ED Nurse Name and Phone #: Victorino Dike [phone]  S Name/Age/Gender Andrew [name] 83 y.o. male Room/Bed: ED14A/ED14A  Code Status   Code Status: Not on file  Home/SNF/Other Home Patient oriented to: self, place, time and situation Is this baseline? Yes   Triage Complete: Triage complete  Chief Complaint shortness of breath  Triage Note PT arrives with wife after being sent by [facility] due to abnormal EKG. Pt states he went to [facility] due to shortness of breath for the last 2 days. Room air saturation 95%.    Allergies Allergies  Allergen Reactions  . Methyldopa Nausea Only and Palpitations    Other reaction(s): Unknown Other reaction(s): NAUSEA 'palpitation" Other reaction(s): NAUSEA Other reaction(s): NAUSEA     Level of Care/Admitting Diagnosis ED Disposition    ED Disposition Condition Comment   Admit  Hospital Area: [facility] [[id number]]  Level of Care: Telemetry [5]  Diagnosis: Acute CHF (congestive heart failure) (HCC) [004599]  Admitting Physician: Ramonita Lab [[id number]]  Attending Physician: Ramonita Lab [[id number]]  Estimated length of stay: 3 - 4 days  Certification:: I certify this patient will need inpatient services for at least 2 midnights  PT Class (Do Not Modify): Inpatient [101]  PT Acc Code (Do Not Modify): Private [1]       B Medical/Surgery History Past Medical History:  Diagnosis Date  . A-fib (HCC)    on coumadin  . CHF (congestive heart failure) (HCC)   . Diabetes mellitus without complication (HCC)       A IV Location/Drains/Wounds Patient Lines/Drains/Airways Status   Active Line/Drains/Airways    Name:   Placement date:   Placement time:   Site:   Days:   Peripheral IV 03/23/18 Right Antecubital   03/23/18    1733    Antecubital   1   Peripheral IV 03/23/18 Left Arm   03/23/18    1734    Arm   1          Intake/Output Last 24 hours  Intake/Output Summary (Last 24 hours) at 03/24/2018  1041 Last data filed at 03/23/2018 2204 Gross per 24 hour  Intake -  Output 524 ml  Net -524 ml    Labs/Imaging Results for orders placed or performed during the hospital encounter of 03/23/18 (from the past 48 hour(s))  Basic metabolic panel     Status: Abnormal   Collection Time: 03/23/18  5:27 PM  Result Value Ref Range   Sodium 140 135 - 145 mmol/L   Potassium 4.1 3.5 - 5.1 mmol/L   Chloride 105 98 - 111 mmol/L   CO2 25 22 - 32 mmol/L   Glucose, Bld 295 (H) 70 - 99 mg/dL   BUN 26 (H) 8 - 23 mg/dL   Creatinine, Ser 7.74 0.61 - 1.24 mg/dL   Calcium 9.2 8.9 - 14.2 mg/dL   GFR calc non Af Amer >60 >60 mL/min   GFR calc Af Amer >60 >60 mL/min   Anion gap 10 5 - 15    Comment: Performed at [facility], [address] [place], [place] [address]  CBC     Status: Abnormal   Collection Time: 03/23/18  5:27 PM  Result Value Ref Range   WBC 6.5 4.0 - 10.5 K/uL   RBC 3.70 (L) 4.22 - 5.81 MIL/uL   Hemoglobin 11.8 (L) 13.0 - 17.0 g/dL   HCT 02.3 (L) 34.3 - 56.8 %   MCV 100.0 80.0 -  100.0 fL   MCH 31.9 26.0 - 34.0 pg   MCHC 31.9 30.0 - 36.0 g/dL   RDW 62.1 30.8 - 65.7 %   Platelets 91 (L) 150 - 400 K/uL    Comment: Immature Platelet Fraction may be clinically indicated, consider ordering this additional test [id number]    nRBC 0.0 0.0 - 0.2 %    Comment: Performed at [facility], [address] [place], [place] [address]  Troponin I - ONCE - STAT     Status: Abnormal   Collection Time: 03/23/18  5:27 PM  Result Value Ref Range   Troponin I 0.06 (HH) <0.03 ng/mL    Comment: CRITICAL RESULT CALLED TO, READ BACK BY AND VERIFIED WITH JESSICA AGUAS @ 1819 ON 03/23/18 BY JUW Performed at [facility], [address] [place], [place] [address]   TSH     Status: None   Collection Time: 03/23/18  5:27 PM  Result Value Ref Range   TSH 2.249 0.350 - 4.500 uIU/mL    Comment: Performed by a 3rd Generation assay with a functional sensitivity of <=0.01  uIU/mL. Performed at [facility], [address] [place], [place] [address]   Brain natriuretic peptide     Status: Abnormal   Collection Time: 03/23/18  5:27 PM  Result Value Ref Range   B Natriuretic Peptide 314.0 (H) 0.0 - 100.0 pg/mL    Comment: Performed at [facility], [address] [place], [place] [address]  Influenza panel by PCR (type A & B)     Status: None   Collection Time: 03/23/18  6:14 PM  Result Value Ref Range   Influenza A By PCR NEGATIVE NEGATIVE   Influenza B By PCR NEGATIVE NEGATIVE    Comment: (NOTE) The Xpert Xpress Flu assay is intended as an aid in the diagnosis of  influenza and should not be used as a sole basis for treatment.  This  assay is FDA approved for nasopharyngeal swab specimens only. Nasal  washings and aspirates are unacceptable for Xpert Xpress Flu testing. Performed at [facility], [address] [place], [place] [address]   Protime-INR     Status: Abnormal   Collection Time: 03/23/18  8:23 PM  Result Value Ref Range   Prothrombin Time 26.6 (H) 11.4 - 15.2 seconds   INR 2.49     Comment: Performed at [facility], [address] [place], [place] [address]  Glucose, capillary     Status: Abnormal   Collection Time: 03/23/18  9:58 PM  Result Value Ref Range   Glucose-Capillary 257 (H) 70 - 99 mg/dL  Troponin I - Now Then Q6H     Status: Abnormal   Collection Time: 03/24/18  3:33 AM  Result Value Ref Range   Troponin I 0.06 (HH) <0.03 ng/mL    Comment: CRITICAL VALUE NOTED. VALUE IS CONSISTENT WITH PREVIOUSLY REPORTED/CALLED VALUE TTG Performed at [facility], [address] [place], [place] [address]   Digoxin level     Status: None   Collection Time: 03/24/18  7:16 AM  Result Value Ref Range   Digoxin Level <2.0 0.8 - 2.0 ng/mL    Comment: RESULTS CONFIRMED BY MANUAL DILUTION Performed at [facility], [address] [place], [place] [address]   Glucose,  capillary     Status: Abnormal   Collection Time: 03/24/18  8:50 AM  Result Value Ref Range   Glucose-Capillary 132 (H) 70 - 99 mg/dL  Dg Chest Portable 1 View  Result Date: 03/23/2018 CLINICAL DATA:  New onset CHF. EXAM: PORTABLE CHEST 1 VIEW COMPARISON:  None. FINDINGS: 1843 hours. Low lung volumes accentuated by lordotic positioning. The cardio pericardial silhouette is enlarged. There is pulmonary vascular congestion without overt pulmonary edema. No substantial pleural effusion or focal airspace consolidation. The visualized bony structures of the thorax are intact. Telemetry leads overlie the chest. IMPRESSION: Low volumes with cardiomegaly and vascular congestion. No overt pulmonary edema. Electronically Signed   By: Kennith Center M.D.   On: 03/23/2018 19:01    Pending Labs Unresulted Labs (From admission, onward)    Start     Ordered   03/25/18 0500  Protime-INR  Daily,   STAT     03/24/18 1005   03/24/18 0736  Respiratory Panel by PCR  (Respiratory virus panel with precautions)  Once,   STAT     03/24/18 0735   03/23/18 2132  Troponin I - Now Then Q6H  Now then every 6 hours,   STAT     03/23/18 2131   Signed and Held  Basic metabolic panel  Daily,   R     Signed and Held          Vitals/Pain Today's Vitals   03/24/18 0700 03/24/18 0730 03/24/18 0800 03/24/18 0830  BP: (!) 169/69 (!) 172/58 (!) 170/59 (!) 162/62  Pulse:  (!) 37 (!) 34 (!) 27  Resp: (!) 21 (!) 23 19 18   Temp:      TempSrc:      SpO2:  96% 100% 100%  Weight:      Height:      PainSc:        Isolation Precautions Droplet precaution  Medications Medications  insulin aspart (novoLOG) injection 0-9 Units (1 Units Subcutaneous Given 03/24/18 0930)  insulin aspart (novoLOG) injection 0-5 Units (3 Units Subcutaneous Given 03/23/18 2202)  warfarin (COUMADIN) tablet 2 mg (has no administration in time range)  warfarin (COUMADIN) tablet 1 mg (has no administration in time range)  furosemide (LASIX)  injection 40 mg (40 mg Intravenous Given 03/23/18 1923)    Mobility walks with person assist Moderate fall risk   Focused Assessments Cardiac Assessment Handoff:  Cardiac Rhythm: Sinus bradycardia(at 37bpm) Lab Results  Component Value Date   TROPONINI 0.06 (HH) 03/24/2018   No results found for: DDIMER Does the Patient currently have chest pain? No     R Recommendations: See Admitting Provider Note  Report given to:   Additional Notes:

## 2018-03-24 NOTE — Consult Note (Signed)
Cardiology Consult    Patient ID: Andrew [name] MRN: [id number], DOB/AGE: [birth year]   Admit date: 03/23/2018 Date of Consult: 03/24/2018  Primary Physician: Raynelle Bring Primary Cardiologist: Yvonne Kendall, MD - New Requesting Provider: R. Wieting, MD  Patient Profile    Andrew [name] is a 83 y.o. male with a history of permanent afib, htn, hl, chf, dm, and remote tob abuse, who is being seen today for the evaluation of bradycardia and heart failure at the request of Dr. Renae Gloss.  Past Medical History   Past Medical History:  Diagnosis Date  . CHF (congestive heart failure) (HCC)   . Diabetes mellitus without complication (HCC)   . Essential hypertension   . Mixed hyperlipidemia   . Permanent atrial fibrillation    a. s/p catheter ablation ~ 12 yrs ago @ [facility] per family->unsuccessful; b. CHA2DS2VASc = 5-->chronic coumadin.    History reviewed. No pertinent surgical history.   Allergies  Allergies  Allergen Reactions  . Methyldopa Nausea Only and Palpitations    Other reaction(s): Unknown Other reaction(s): NAUSEA 'palpitation" Other reaction(s): NAUSEA Other reaction(s): NAUSEA     History of Present Illness    83 y/o ? w/ the above PMH including Afib, HTN, HL, DMII, and CHF.  He was previously seen by cardiology in [place] and his family notes that he is s/p catheter ablation for Afib ~ 10-12 yrs ago.  To their knowledge, this was unsuccessful w/ ERAF shortly after the procedure.  They believe that he has been in permanent afib since.  He is chronically anticoagulated on warfarin, w/ INR's followed closely by his PCP locally.  He has not seen cardiology in many years.  Andrew [name] reports a chronic cough w/ intermittent dyspnea, mostly noted when speaking, for which he takes 3 cough drops nightly.  Otw, he reports having been in good health.  Review of outpt primary care notes shows that in the late summer of 2019, he required down-titration of  carvedilol from 25mg  BID to 12.5mg  BID in the setting of bradycardia in the 50's and a feeling of low energy.  Symptoms/HR improved following this change.  He was in his USOH until ~ 1 wk ago, when he began to notice worsening dyspnea w/ speech and DOE.  His family members report that he seemed to be more weak than usual and even fell on 2 occasions when walking and reaching out to steady himself but then missing the thing he was reaching for, losing his balance, and falling.  He did not suffer any trauma or syncope, however, Andrew [name] has noted intermittent lightheadedness over the past 2-3 days.  He denies chest pain, palpitations, pnd, orthopnea, n, v, edema, weight gain, or early satiety. On 2/19, he saw his PCP and was found to be bradycardic with a rate of 35.  In that setting, he was referred to the [facility], where he had persistent bradycardia with evidence for complete heart block and underlying afib.  BNP was mildly elevated @ 314, while trop was mildly elevated @ 0.06.  CXR showed pulm vascular congestion.  He was treated w/ 1 dose of IV lasix and admitted to internal medicine.   blocker therapy (last dose 2/19 AM) and amlodipine (last dose 2/18 PM) have been held.  HR's have remained in the mid 30's with fairly regular ventricular response.  He has been hemodynamically stable and in fact, hypertensive.  Troponins have remained mildly elevated w/ a flat trend (0.06  0.06  0.07  0.07).  Pt says breathing is better this AM, as he is now able to speak in more complete sentences before becoming dyspneic, but thinks that he'd do much better if he were allowed to use cough drops.  Inpatient Medications    . doxazosin  4 mg Oral Nightly  . furosemide  40 mg Intravenous BID  . glimepiride  4 mg Oral Daily  . insulin aspart  0-5 Units Subcutaneous QHS  . insulin aspart  0-9 Units Subcutaneous TID WC  . simvastatin  20 mg Oral Nightly  . sodium chloride flush  3 mL Intravenous Q12H  . vitamin B-12   1,000 mcg Oral Daily    Family History    Family History  Problem Relation Age of Onset  . CVA Mother   . Bladder Cancer Father    He indicated that his mother is deceased. He indicated that his father is deceased.   Social History    Social History   Socioeconomic History  . Marital status: Married    Spouse name: Not on file  . Number of children: Not on file  . Years of education: Not on file  . Highest education level: Not on file  Occupational History  . Not on file  Social Needs  . Financial resource strain: Not on file  . Food insecurity:    Worry: Not on file    Inability: Not on file  . Transportation needs:    Medical: Not on file    Non-medical: Not on file  Tobacco Use  . Smoking status: Former Smoker    Packs/day: 1.50    Years: 35.00    Pack years: 52.50  . Smokeless tobacco: Never Used  Substance and Sexual Activity  . Alcohol use: Yes    Comment: 1.5 beers a month  . Drug use: Never  . Sexual activity: Not on file  Lifestyle  . Physical activity:    Days per week: Not on file    Minutes per session: Not on file  . Stress: Not on file  Relationships  . Social connections:    Talks on phone: Not on file    Gets together: Not on file    Attends religious service: Not on file    Active member of club or organization: Not on file    Attends meetings of clubs or organizations: Not on file    Relationship status: Not on file  . Intimate partner violence:    Fear of current or ex partner: Not on file    Emotionally abused: Not on file    Physically abused: Not on file    Forced sexual activity: Not on file  Other Topics Concern  . Not on file  Social History Narrative   Lives locally with wife.  Dtr lives nearby and helps out.  Does not routinely exercise.     Review of Systems    General:  No chills, fever, night sweats or weight changes.  Cardiovascular:  No chest pain, +++ dyspnea on exertion, +++ mild LE edema, no orthopnea,  palpitations, paroxysmal nocturnal dyspnea. +++ presyncope. No syncope. Dermatological: No rash, lesions/masses Respiratory: +++ cough, +++ dyspnea Urologic: No hematuria, dysuria Abdominal:   No nausea, vomiting, diarrhea, bright red blood per rectum, melena, or hematemesis Neurologic:  No visual changes, +++ wkns, no changes in mental status. All other systems reviewed and are otherwise negative except as noted above.  Physical Exam    Blood pressure (!) 166/56, pulse Marland Kitchen)  34, temperature 98.8 F (37.1 C), temperature source Oral, resp. rate 18, height 5\' 10"  (1.778 m), weight 81.6 kg, SpO2 99 %.  General: Pleasant, NAD Psych: Normal affect. Neuro: Alert and oriented X 3. Moves all extremities spontaneously. HEENT: Normal  Neck: Supple without bruits or JVD. Lungs:  Resp regular and unlabored, diminished breath sounds bilat w/ bibasilar crackles. Heart: Reg, brady, distant, no s3, s4, or murmurs. Abdomen: Soft, non-tender, non-distended, BS + x 4.  Extremities: No clubbing, cyanosis.edema. DP/PT/Radials 1+ and equal bilaterally.  Labs    Troponin Recent Labs    03/23/18 1727 03/24/18 0333 03/24/18 1115 03/24/18 1229  TROPONINI 0.06* 0.06* 0.07* 0.07*   Lab Results  Component Value Date   WBC 6.5 03/23/2018   HGB 11.8 (L) 03/23/2018   HCT 37.0 (L) 03/23/2018   MCV 100.0 03/23/2018   PLT 91 (L) 03/23/2018    Recent Labs  Lab 03/23/18 1727  NA 140  K 4.1  CL 105  CO2 25  BUN 26*  CREATININE 0.99  CALCIUM 9.2  GLUCOSE 295*    Lab Results  Component Value Date   INR 2.49 03/23/2018   INR 2.03 01/21/2017    Lab Results  Component Value Date   TSH 2.249 03/23/2018    Radiology Studies    Dg Chest Portable 1 View  Result Date: 03/23/2018 CLINICAL DATA:  New onset CHF. EXAM: PORTABLE CHEST 1 VIEW COMPARISON:  None. FINDINGS: 1843 hours. Low lung volumes accentuated by lordotic positioning. The cardio pericardial silhouette is enlarged. There is pulmonary  vascular congestion without overt pulmonary edema. No substantial pleural effusion or focal airspace consolidation. The visualized bony structures of the thorax are intact. Telemetry leads overlie the chest. IMPRESSION: Low volumes with cardiomegaly and vascular congestion. No overt pulmonary edema. Electronically Signed   By: Kennith Center M.D.   On: 03/23/2018 19:01    ECG & Cardiac Imaging    Afib, 36, left axis, RBBB - personally reviewed. Tele - Afib, 30's - ventricular response occurs @ regular intervals - suggestive of CHB  Assessment & Plan    1.  Afib w/ slow ventricular response/Complete heart block: Pt admitted 2/19 w/ ~ 1 wk h/o progressive dyspnea, fatigue, and weakness.  Found to be bradycardic w/ rates in the 30's and underlying afib.  Also found to be volume up.  Last  blocker dose 2/19 AM.  Persistent bradycardia this AM. Hemodynamically stable.  Review of tele suggests complete heart block w/ jxnl escape, rates in the mid 30's and RBBB.  Labs unremarkable, echo pending.  Allow for further  blocker washout.  If echo shows nl EF and he remains bradycardic in AM, will likely require tx to [facility] for EP eval and PPM placement. Cont coumadin.  INR rx.  2.  Acute CHF: ? syst vs diast.  Ss of progressive dyspnea and mild edema over the last week.  CXR w/ vascular congestion.  Cont IV diuresis, as he remains dyspneic.  Echo pending.  If EF down, will need ischemic eval.    3.  Elevated troponin:  No h/o c/p.  Suspect demand ischemia in the setting of above.  Trop 0.06  0.06  0.07  0.07.  Echo pending.  I do not think that this represents ACS. No asa in the setting of chronic coumadin.  No  blocker 2/2 bradycardia/HB.  He remains on home dose of statin.  As above, he will likely require an ischemic eval.  If EF nl, could likely  be done as outpt.  4.  Essential HTN:  BPs trending high.   blocker and amlodipine on hold.  Resume home dose of lisinopril - 20mg  daily.  Follow w/ diuresis.     5.  DMII:  A1c 8.7 in Dec.  SSI per IM.  6.  HL:  LDL 45 in 03/2017.  Cont statin rx.  Signed, Nicolasa Duckinghristopher Mayeli Bornhorst, NP 03/24/2018, 3:14 PM  For questions or updates, please contact   Please consult [email or web address] for contact info under Cardiology/STEMI.

## 2018-03-24 NOTE — Progress Notes (Signed)
ANTICOAGULATION CONSULT NOTE - Initial Consult  Pharmacy Consult for warfarin dosing Indication: atrial fibrillation  Allergies  Allergen Reactions  . Methyldopa Nausea Only and Palpitations    Other reaction(s): Unknown Other reaction(s): NAUSEA 'palpitation" Other reaction(s): NAUSEA Other reaction(s): NAUSEA     Patient Measurements: Height: 5\' 10"  (177.8 cm) Weight: 180 lb (81.6 kg) IBW/kg (Calculated) : 73 Heparin Dosing Weight: n/a  Vital Signs: BP: 162/62 (02/20 0830) Pulse Rate: 27 (02/20 0830)  Labs: Recent Labs    03/23/18 1727 03/23/18 2023 03/24/18 0333  HGB 11.8*  --   --   HCT 37.0*  --   --   PLT 91*  --   --   LABPROT  --  26.6*  --   INR  --  2.49  --   CREATININE 0.99  --   --   TROPONINI 0.06*  --  0.06*    Estimated Creatinine Clearance: 61.4 mL/min (by C-G formula based on SCr of 0.99 mg/dL).   Medical History: Past Medical History:  Diagnosis Date  . A-fib (HCC)    on coumadin  . CHF (congestive heart failure) (HCC)   . Diabetes mellitus without complication (HCC)     Medications:  Home dose 2 mg Monday-Friday; 1 mg Saturday and Sunday  Assessment: INR therapeutic on admission  Date INR Warfarin Dose  2/19 2.49- baseline Not documented   2/20 -- 2 mg    Goal of Therapy:  INR 2-3   Plan:  Resume home regimen as above. F/u INR as clinically indicated.  Paschal Dopp, PharmD, BCPS  03/24/18 10:02 AM

## 2018-03-24 NOTE — Care Management Note (Signed)
Case Management Note  Patient Details  Name: Andrew [name] MRN: [id number] Date of Birth: [birth year]  Subjective/Objective:    Patient is from home with wife.  New diagnoses of CHF.  Acute oxygen at  2L.  He is independent in all ADL's.  Has a functioning scale at home.  Brought Living Better with Heart Failure booklet to room and review with wife [name] as patient was sleeping.  Uses [facility] pharmacy on [address]. Current with PCP, Dr. Letitia Libra.  Will continue to follow as patient progresses and will assess HH needs tomorrow when patient is awake.                 Action/Plan:   Expected Discharge Date:                  Expected Discharge Plan:  Home w Home Health Services  In-House Referral:     Discharge planning Services  CM Consult, HF Clinic  Post Acute Care Choice:    Choice offered to:     DME Arranged:    DME Agency:     HH Arranged:  RN, PT, Nurse's Aide HH Agency:     Status of Service:  In process, will continue to follow  If discussed at Long Length of Stay Meetings, dates discussed:    Additional Comments:  Sherren Kerns, RN 03/24/2018, 2:55 PM

## 2018-03-24 NOTE — Progress Notes (Signed)
PT Cancellation Note  Patient Details Name: MEGUEL HELMING MRN: [id number] DOB: 10-May-1935   Cancelled Treatment:    Reason Eval/Treat Not Completed: Medical issues which prohibited therapy - HR 34. Will continue to monitor and evaluate when appropriate.   Rannie Craney 03/24/2018, 2:32 PM

## 2018-03-25 ENCOUNTER — Inpatient Hospital Stay (HOSPITAL_COMMUNITY)
Admit: 2018-03-25 | Discharge: 2018-03-25 | Disposition: A | Discharge status: Home / Self Care | Payer: Medicare HMO | Attending: Internal Medicine | Admitting: Internal Medicine

## 2018-03-25 ENCOUNTER — Inpatient Hospital Stay (HOSPITAL_COMMUNITY)
Admission: RE | Admit: 2018-03-25 | Discharge: 2018-03-26 | DRG: 242 | Disposition: A | Discharge status: Home / Self Care | Payer: Medicare HMO | Attending: Internal Medicine | Admitting: Internal Medicine

## 2018-03-25 ENCOUNTER — Encounter: Payer: Self-pay | Admitting: *Deleted

## 2018-03-25 ENCOUNTER — Encounter (HOSPITAL_COMMUNITY)
Admission: RE | Disposition: A | Discharge status: Home / Self Care | Payer: Self-pay | Source: Home / Self Care | Attending: Internal Medicine

## 2018-03-25 DIAGNOSIS — I5033 Acute on chronic diastolic (congestive) heart failure: Secondary | ICD-10-CM | POA: Diagnosis not present

## 2018-03-25 DIAGNOSIS — I110 Hypertensive heart disease with heart failure: Secondary | ICD-10-CM | POA: Diagnosis not present

## 2018-03-25 DIAGNOSIS — I4821 Permanent atrial fibrillation: Secondary | ICD-10-CM | POA: Diagnosis not present

## 2018-03-25 DIAGNOSIS — Z888 Allergy status to other drugs, medicaments and biological substances status: Secondary | ICD-10-CM

## 2018-03-25 DIAGNOSIS — I10 Essential (primary) hypertension: Secondary | ICD-10-CM | POA: Diagnosis not present

## 2018-03-25 DIAGNOSIS — I442 Atrioventricular block, complete: Secondary | ICD-10-CM | POA: Diagnosis not present

## 2018-03-25 DIAGNOSIS — I361 Nonrheumatic tricuspid (valve) insufficiency: Secondary | ICD-10-CM | POA: Diagnosis not present

## 2018-03-25 DIAGNOSIS — Z794 Long term (current) use of insulin: Secondary | ICD-10-CM | POA: Diagnosis not present

## 2018-03-25 DIAGNOSIS — R918 Other nonspecific abnormal finding of lung field: Secondary | ICD-10-CM | POA: Diagnosis not present

## 2018-03-25 DIAGNOSIS — Z7901 Long term (current) use of anticoagulants: Secondary | ICD-10-CM | POA: Diagnosis not present

## 2018-03-25 DIAGNOSIS — Z79899 Other long term (current) drug therapy: Secondary | ICD-10-CM

## 2018-03-25 DIAGNOSIS — R001 Bradycardia, unspecified: Secondary | ICD-10-CM | POA: Diagnosis not present

## 2018-03-25 DIAGNOSIS — E876 Hypokalemia: Secondary | ICD-10-CM | POA: Diagnosis not present

## 2018-03-25 DIAGNOSIS — I509 Heart failure, unspecified: Secondary | ICD-10-CM | POA: Diagnosis not present

## 2018-03-25 DIAGNOSIS — Z8052 Family history of malignant neoplasm of bladder: Secondary | ICD-10-CM

## 2018-03-25 DIAGNOSIS — E119 Type 2 diabetes mellitus without complications: Secondary | ICD-10-CM | POA: Diagnosis present

## 2018-03-25 DIAGNOSIS — I340 Nonrheumatic mitral (valve) insufficiency: Secondary | ICD-10-CM

## 2018-03-25 DIAGNOSIS — E782 Mixed hyperlipidemia: Secondary | ICD-10-CM | POA: Diagnosis not present

## 2018-03-25 DIAGNOSIS — I455 Other specified heart block: Secondary | ICD-10-CM | POA: Diagnosis not present

## 2018-03-25 DIAGNOSIS — Z959 Presence of cardiac and vascular implant and graft, unspecified: Secondary | ICD-10-CM

## 2018-03-25 DIAGNOSIS — Z452 Encounter for adjustment and management of vascular access device: Secondary | ICD-10-CM | POA: Diagnosis not present

## 2018-03-25 HISTORY — PX: PACEMAKER IMPLANT: EP1218

## 2018-03-25 LAB — BASIC METABOLIC PANEL
Anion gap: 6 (ref 5–15)
BUN: 19 mg/dL (ref 8–23)
CO2: 31 mmol/L (ref 22–32)
Calcium: 8.7 mg/dL — ABNORMAL LOW (ref 8.9–10.3)
Chloride: 106 mmol/L (ref 98–111)
Creatinine, Ser: 0.74 mg/dL (ref 0.61–1.24)
GFR calc Af Amer: >60 mL/min (ref >60)
GFR calc non Af Amer: >60 mL/min (ref >60)
Glucose, Bld: 178 mg/dL — ABNORMAL HIGH (ref 70–99)
Potassium: 3.1 mmol/L — ABNORMAL LOW (ref 3.5–5.1)
Sodium: 143 mmol/L (ref 135–145)

## 2018-03-25 LAB — CBC
HEMATOCRIT: 34.4 % — AB (ref 39.0–52.0)
Hemoglobin: 10.9 g/dL — ABNORMAL LOW (ref 13.0–17.0)
MCH: 31.8 pg (ref 26.0–34.0)
MCHC: 31.7 g/dL (ref 30.0–36.0)
MCV: 100.3 fL — ABNORMAL HIGH (ref 80.0–100.0)
Platelets: 84 10*3/uL — ABNORMAL LOW (ref 150–400)
RBC: 3.43 MIL/uL — ABNORMAL LOW (ref 4.22–5.81)
RDW: 14.8 % (ref 11.5–15.5)
WBC: 4.1 10*3/uL (ref 4.0–10.5)
nRBC: 0 % (ref 0.0–0.2)

## 2018-03-25 LAB — GLUCOSE, CAPILLARY
Glucose-Capillary: 162 mg/dL — ABNORMAL HIGH (ref 70–99)
Glucose-Capillary: 190 mg/dL — ABNORMAL HIGH (ref 70–99)
Glucose-Capillary: 269 mg/dL — ABNORMAL HIGH (ref 70–99)
Glucose-Capillary: 249 mg/dL — ABNORMAL HIGH (ref 70–99)

## 2018-03-25 LAB — ECHOCARDIOGRAM COMPLETE
Height: 70 in
WEIGHTICAEL: 2917.13 [oz_av]

## 2018-03-25 LAB — PROTIME-INR
INR: 1.39
Prothrombin Time: 16.9 seconds — ABNORMAL HIGH (ref 11.4–15.2)

## 2018-03-25 SURGERY — PACEMAKER IMPLANT

## 2018-03-25 MED ORDER — WARFARIN SODIUM 2 MG PO TABS: 2.0000 mg | ORAL_TABLET | Freq: Every day | ORAL | Status: DC

## 2018-03-25 MED ORDER — POTASSIUM CHLORIDE CRYS ER 20 MEQ PO TBCR
40.0000 meq | EXTENDED_RELEASE_TABLET | Freq: Two times a day (BID) | ORAL | Status: DC
  Administered 2018-03-25 – 2018-03-26 (×3): 40 meq via ORAL
  Filled 2018-03-25 (×2): qty 2

## 2018-03-25 MED ORDER — LISINOPRIL 20 MG PO TABS: 20.0000 mg | ORAL_TABLET | Freq: Every day | ORAL | Status: DC

## 2018-03-25 MED ORDER — ONDANSETRON HCL 4 MG/2ML IJ SOLN: 4.0000 mg | Freq: Four times a day (QID) | INTRAMUSCULAR | Status: DC | PRN

## 2018-03-25 MED ORDER — CEFAZOLIN SODIUM-DEXTROSE 2-4 GM/100ML-% IV SOLN
INTRAVENOUS | Status: AC
  Filled 2018-03-25: qty 100

## 2018-03-25 MED ORDER — LIDOCAINE HCL (PF) 1 % IJ SOLN
INTRAMUSCULAR | Status: DC | PRN
  Administered 2018-03-25: 60 mL

## 2018-03-25 MED ORDER — WARFARIN - PHARMACIST DOSING INPATIENT
Freq: Every day | Status: DC
  Administered 2018-03-25: 19:00:00

## 2018-03-25 MED ORDER — ACETAMINOPHEN 325 MG PO TABS: 650.0000 mg | ORAL_TABLET | ORAL | Status: DC | PRN

## 2018-03-25 MED ORDER — INSULIN ASPART 100 UNIT/ML ~~LOC~~ SOLN
0.0000 [IU] | Freq: Three times a day (TID) | SUBCUTANEOUS | Status: DC
  Administered 2018-03-25: 3 [IU] via SUBCUTANEOUS
  Administered 2018-03-25 – 2018-03-26 (×2): 8 [IU] via SUBCUTANEOUS

## 2018-03-25 MED ORDER — ORAL CARE MOUTH RINSE: 15.0000 mL | Freq: Two times a day (BID) | OROMUCOSAL | Status: DC

## 2018-03-25 MED ORDER — LIDOCAINE HCL (PF) 1 % IJ SOLN
INTRAMUSCULAR | Status: AC
  Filled 2018-03-25: qty 60

## 2018-03-25 MED ORDER — HEPARIN (PORCINE) IN NACL 1000-0.9 UT/500ML-% IV SOLN
INTRAVENOUS | Status: DC | PRN
  Administered 2018-03-25: 500 mL

## 2018-03-25 MED ORDER — SODIUM CHLORIDE 0.9% FLUSH
3.0000 mL | Freq: Two times a day (BID) | INTRAVENOUS | Status: DC
  Administered 2018-03-25 (×2): 3 mL via INTRAVENOUS

## 2018-03-25 MED ORDER — POTASSIUM CHLORIDE CRYS ER 20 MEQ PO TBCR
40.0000 meq | EXTENDED_RELEASE_TABLET | Freq: Once | ORAL | Status: AC
  Administered 2018-03-25: 40 meq via ORAL

## 2018-03-25 MED ORDER — SODIUM CHLORIDE 0.9% FLUSH: 3.0000 mL | Freq: Two times a day (BID) | INTRAVENOUS | Status: DC

## 2018-03-25 MED ORDER — HYDROCODONE-ACETAMINOPHEN 5-325 MG PO TABS
1.0000 | ORAL_TABLET | ORAL | Status: DC | PRN
  Administered 2018-03-25: 2 via ORAL
  Filled 2018-03-25: qty 2

## 2018-03-25 MED ORDER — ACETAMINOPHEN 325 MG PO TABS: 325.0000 mg | ORAL_TABLET | ORAL | Status: DC | PRN

## 2018-03-25 MED ORDER — SODIUM CHLORIDE 0.9 % IV SOLN: INTRAVENOUS | Status: DC

## 2018-03-25 MED ORDER — FUROSEMIDE 10 MG/ML IJ SOLN
40.0000 mg | Freq: Two times a day (BID) | INTRAMUSCULAR | Status: DC
  Administered 2018-03-25 – 2018-03-26 (×2): 40 mg via INTRAVENOUS
  Filled 2018-03-25 (×2): qty 4

## 2018-03-25 MED ORDER — SODIUM CHLORIDE 0.9 % IV SOLN: 250.0000 mL | INTRAVENOUS | Status: DC

## 2018-03-25 MED ORDER — SODIUM CHLORIDE 0.9 % IV SOLN: 250.0000 mL | INTRAVENOUS | Status: DC | PRN

## 2018-03-25 MED ORDER — SIMVASTATIN 20 MG PO TABS
40.0000 mg | ORAL_TABLET | Freq: Every day | ORAL | Status: DC
  Administered 2018-03-25: 40 mg via ORAL

## 2018-03-25 MED ORDER — WARFARIN SODIUM 2 MG PO TABS
2.0000 mg | ORAL_TABLET | Freq: Once | ORAL | Status: AC
  Administered 2018-03-25: 2 mg via ORAL
  Filled 2018-03-25: qty 1

## 2018-03-25 MED ORDER — FUROSEMIDE 10 MG/ML IJ SOLN
40.0000 mg | Freq: Two times a day (BID) | INTRAMUSCULAR | Status: DC
  Administered 2018-03-25: 40 mg via INTRAVENOUS
  Filled 2018-03-25: qty 4

## 2018-03-25 MED ORDER — IOPAMIDOL (ISOVUE-370) INJECTION 76%
INTRAVENOUS | Status: DC | PRN
  Administered 2018-03-25: 15 mL via INTRAVENOUS

## 2018-03-25 MED ORDER — SODIUM CHLORIDE 0.9 % IV SOLN
80.0000 mg | INTRAVENOUS | Status: AC
  Administered 2018-03-25: 80 mg

## 2018-03-25 MED ORDER — IOPAMIDOL (ISOVUE-370) INJECTION 76%
INTRAVENOUS | Status: AC
  Filled 2018-03-25: qty 50

## 2018-03-25 MED ORDER — SODIUM CHLORIDE 0.9% FLUSH: 3.0000 mL | INTRAVENOUS | Status: DC | PRN

## 2018-03-25 MED ORDER — POTASSIUM CHLORIDE CRYS ER 10 MEQ PO TBCR
EXTENDED_RELEASE_TABLET | ORAL | Status: AC
  Filled 2018-03-25: qty 4

## 2018-03-25 MED ORDER — CEFAZOLIN SODIUM-DEXTROSE 2-4 GM/100ML-% IV SOLN
2.0000 g | INTRAVENOUS | Status: AC
  Administered 2018-03-25: 2 g via INTRAVENOUS

## 2018-03-25 MED ORDER — CHLORHEXIDINE GLUCONATE 4 % EX LIQD: 60.0000 mL | Freq: Once | CUTANEOUS | Status: DC

## 2018-03-25 MED ORDER — CEFAZOLIN SODIUM-DEXTROSE 1-4 GM/50ML-% IV SOLN
1.0000 g | Freq: Four times a day (QID) | INTRAVENOUS | Status: AC
  Administered 2018-03-25 – 2018-03-26 (×3): 1 g via INTRAVENOUS
  Filled 2018-03-25 (×3): qty 50

## 2018-03-25 MED ORDER — HEPARIN (PORCINE) IN NACL 1000-0.9 UT/500ML-% IV SOLN
INTRAVENOUS | Status: AC
  Filled 2018-03-25: qty 500

## 2018-03-25 MED ORDER — DOXAZOSIN MESYLATE 4 MG PO TABS
4.0000 mg | ORAL_TABLET | Freq: Every day | ORAL | Status: DC
  Administered 2018-03-25: 4 mg via ORAL
  Filled 2018-03-25: qty 1

## 2018-03-25 MED ORDER — FUROSEMIDE 10 MG/ML IJ SOLN: 40.0000 mg | Freq: Two times a day (BID) | INTRAMUSCULAR | 0 refills | Status: DC

## 2018-03-25 MED ORDER — LISINOPRIL 40 MG PO TABS
40.0000 mg | ORAL_TABLET | Freq: Every day | ORAL | Status: DC
  Administered 2018-03-26: 40 mg via ORAL
  Filled 2018-03-25: qty 1

## 2018-03-25 MED ORDER — SODIUM CHLORIDE 0.9 % IV SOLN
INTRAVENOUS | Status: AC
  Filled 2018-03-25: qty 2

## 2018-03-25 MED ORDER — GLIMEPIRIDE 4 MG PO TABS
4.0000 mg | ORAL_TABLET | Freq: Every day | ORAL | Status: DC
  Administered 2018-03-26: 4 mg via ORAL
  Filled 2018-03-25: qty 1

## 2018-03-25 SURGICAL SUPPLY — 6 items
CABLE SURGICAL S-101-97-12 (CABLE) ×3 IMPLANT
LEAD TENDRIL MRI 58CM LPA1200M (Lead) ×3 IMPLANT
PACEMAKER ASSURITY SR-SF (Pacemaker) ×3 IMPLANT
PAD PRO RADIOLUCENT 2001M-C (PAD) ×3 IMPLANT
SHEATH CLASSIC 8F (SHEATH) ×3 IMPLANT
TRAY PACEMAKER INSERTION (PACKS) ×3 IMPLANT

## 2018-03-25 NOTE — Progress Notes (Signed)
Progress Note  Patient Name: Andrew [name] Date of Encounter: 03/25/2018  Primary Cardiologist: Yvonne Kendall, MD  Subjective   Breathing much improved.  No chest pain, palps, presyncope, syncope.  Remains brady.  Inpatient Medications    Scheduled Meds: . doxazosin  4 mg Oral Nightly  . glimepiride  4 mg Oral Daily  . insulin aspart  0-5 Units Subcutaneous QHS  . insulin aspart  0-9 Units Subcutaneous TID WC  . lisinopril  20 mg Oral Daily  . mouth rinse  15 mL Mouth Rinse BID  . simvastatin  20 mg Oral Nightly  . sodium chloride flush  3 mL Intravenous Q12H  . vitamin B-12  1,000 mcg Oral Daily   Continuous Infusions: . sodium chloride     PRN Meds: sodium chloride, acetaminophen, diphenhydrAMINE, ondansetron (ZOFRAN) IV, sodium chloride flush   Vital Signs    Vitals:   03/24/18 1157 03/24/18 1602 03/24/18 1945 03/25/18 0507  BP: (!) 166/56 (!) 182/60 (!) 168/51 (!) 161/49  Pulse: (!) 34 (!) 35 (!) 44 69  Resp:   (!) 26 18  Temp: 98.8 F (37.1 C)  98.2 F (36.8 C) 98.4 F (36.9 C)  TempSrc: Oral  Oral Oral  SpO2: 99% 97% 98% 94%  Weight:    82.7 kg  Height:        Intake/Output Summary (Last 24 hours) at 03/25/2018 0819 Last data filed at 03/25/2018 0800 Gross per 24 hour  Intake 45.89 ml  Output 1800 ml  Net -1754.11 ml   Filed Weights   03/23/18 1701 03/25/18 0507  Weight: 81.6 kg 82.7 kg    Physical Exam   GEN: Well nourished, well developed, in no acute distress.  HEENT: Grossly normal.  Neck: Supple, no JVD, carotid bruits, or masses. Cardiac: Irreg, brady, 2/6 diast murmur @ apex, no rubs, or gallops. No clubbing, cyanosis.  Trace to 1+ bilat LE edema.  Radials/DP/PT 1+ and equal bilaterally.  Respiratory:  Respirations regular and unlabored, diminished breath sounds bilat w/ basilar crackles and faint insp/exp wheezing. GI: Soft, nontender, nondistended, BS + x 4. MS: no deformity or atrophy. Skin: warm and dry, no rash. Neuro:   Strength and sensation are intact. Psych: AAOx3.  Normal affect.  Labs    Chemistry Recent Labs  Lab 03/23/18 1727  NA 140  K 4.1  CL 105  CO2 25  GLUCOSE 295*  BUN 26*  CREATININE 0.99  CALCIUM 9.2  GFRNONAA >60  GFRAA >60  ANIONGAP 10     Hematology Recent Labs  Lab 03/23/18 1727  WBC 6.5  RBC 3.70*  HGB 11.8*  HCT 37.0*  MCV 100.0  MCH 31.9  MCHC 31.9  RDW 14.9  PLT 91*    Cardiac Enzymes Recent Labs  Lab 03/23/18 1727 03/24/18 0333 03/24/18 1115 03/24/18 1229  TROPONINI 0.06* 0.06* 0.07* 0.07*      BNP Recent Labs  Lab 03/23/18 1727  BNP 314.0*      Radiology    Dg Chest Portable 1 View  Result Date: 03/23/2018 CLINICAL DATA:  New onset CHF. EXAM: PORTABLE CHEST 1 VIEW COMPARISON:  None. FINDINGS: 1843 hours. Low lung volumes accentuated by lordotic positioning. The cardio pericardial silhouette is enlarged. There is pulmonary vascular congestion without overt pulmonary edema. No substantial pleural effusion or focal airspace consolidation. The visualized bony structures of the thorax are intact. Telemetry leads overlie the chest. IMPRESSION: Low volumes with cardiomegaly and vascular congestion. No overt pulmonary edema. Electronically Signed  By: Kennith Center M.D.   On: 03/23/2018 19:01    Telemetry    Afib, slow V response - 34-37. Rare PVC. - Personally Reviewed  Cardiac Studies   Echo pending  Patient Profile     83 y.o. male with a history of permanent afib, htn, hl, chf, dm, and remote tob abuse, who was admitted 2/19 with progressive dyspnea and bradycardia/slow afib.  Assessment & Plan    1.  Afib w/ slow ventricular response/symptomatic bradycardia:  He remains in slow afib with rates in the 30's despite holding  blocker.  Last dose of  blocker was the AM of 2/19.  He has been hemodynamically stable and in fact, hypertensive.  We have discussed his case with our EP team @ [facility].  Echo to be done now.  Will plan on tx to [facility]  today for PPM placement by Dr. Johney Frame.  He will need a progressive bed.  2.  Acute CHF: In setting of above.  Minus 1.6L overnight and 2.1L since admission.  Wt likely inaccurate as listed up 1.1 kg.  Breathing much improved.  Still with mild volume overload on exam.  Echo being done now - will obtain read as soon as available.  Labs just drawn - f/u creat.  Cont acei and diuretic rx.  No  blocker in setting of above.  3.  Elevated troponin: No h/o c/p. Likely demand ischemia in the setting of above.  Trop 0.06  0.06  0.07  0.07.  Echo pending.  No asa in setting of chronic coumadin (on hold). No  blocker 2/2 bradycardia. Pending echo, will likely require ischemic eval.  4.  Essential HTN: BP remains elevated off of  blocker and amlodipine.  Will titrate lisinopril.   5.  DMII: A1c 8.7 in 01/2018.  Insulin per IM>  6.  HL: LDL 45 in 03/2017.  Cont statin.  Signed, Nicolasa Ducking, NP  03/25/2018, 8:19 AM    For questions or updates, please contact   Please consult [email or web address] for contact info under Cardiology/STEMI.

## 2018-03-25 NOTE — Progress Notes (Signed)
ANTICOAGULATION CONSULT NOTE - Initial Consult  Pharmacy Consult for warfarin dosing Indication: atrial fibrillation  Allergies  Allergen Reactions  . Methyldopa Nausea Only and Palpitations    Other reaction(s): Unknown Other reaction(s): NAUSEA 'palpitation" Other reaction(s): NAUSEA Other reaction(s): NAUSEA     Patient Measurements: Height: 5\' 10"  (177.8 cm) Weight: 188 lb 4.4 oz (85.4 kg) IBW/kg (Calculated) : 73 Heparin Dosing Weight: n/a  Vital Signs: Temp: 97.3 F (36.3 C) (02/21 1221) Temp Source: Oral (02/21 1221) BP: 162/65 (02/21 1221) Pulse Rate: 63 (02/21 1221)  Labs: Recent Labs    03/23/18 1727 03/23/18 2023 03/24/18 0333 03/24/18 1115 03/24/18 1229 03/25/18 0804  HGB 11.8*  --   --   --   --  10.9*  HCT 37.0*  --   --   --   --  34.4*  PLT 91*  --   --   --   --  84*  LABPROT  --  26.6*  --   --   --  16.9*  INR  --  2.49  --   --   --  1.39  CREATININE 0.99  --   --   --   --  0.74  TROPONINI 0.06*  --  0.06* 0.07* 0.07*  --     Estimated Creatinine Clearance: 76 mL/min (by C-G formula based on SCr of 0.74 mg/dL).   Medical History: Past Medical History:  Diagnosis Date  . CHF (congestive heart failure) (HCC)   . Diabetes mellitus without complication (HCC)   . Essential hypertension   . Mixed hyperlipidemia   . Permanent atrial fibrillation    a. s/p catheter ablation ~ 12 yrs ago @ [facility] per family->unsuccessful; b. CHA2DS2VASc = 5-->chronic coumadin.    Medications:  Home dose 2 mg Monday-Friday; 1 mg Saturday and Sunday  Assessment: 83 year old male with history of permanent afib on warfarin prior to admission. INR therapeutic on admission to [facility], now s/p ppm this morning. INR trended down to 1.39 after vitamin k yesterday.   No bleeding or hematoma issues noted. Hgb stable 10.9, plt low at baseline and currently 84.   Goal of Therapy:  INR 2-3   Plan:  Warfarin 2mg  tonight Daily INR for now  Sheppard Coil PharmD.,  BCPS Clinical Pharmacist 03/25/2018 1:17 PM

## 2018-03-25 NOTE — Discharge Summary (Signed)
[facility] - [place] at [facility]   PATIENT NAME: Andrew [name]    MR#:  [id number]  DATE OF BIRTH:  10-04-1935  DATE OF ADMISSION:  03/23/2018 ADMITTING PHYSICIAN: Ramonita Lab, MD  DATE OF Transfer to [facility]: 03/25/2018  PRIMARY CARE PHYSICIAN: [facility], Gavin Potters    ADMISSION DIAGNOSIS:  Junctional bradycardia [R00.1] Acute on chronic congestive heart failure, unspecified heart failure type (HCC) [I50.9]  DISCHARGE DIAGNOSIS:  Active Problems:   Acute CHF (congestive heart failure) (HCC)   SECONDARY DIAGNOSIS:   Past Medical History:  Diagnosis Date  . CHF (congestive heart failure) (HCC)   . Diabetes mellitus without complication (HCC)   . Essential hypertension   . Mixed hyperlipidemia   . Permanent atrial fibrillation    a. s/p catheter ablation ~ 12 yrs ago @ [facility] per family->unsuccessful; b. CHA2DS2VASc = 5-->chronic coumadin.    HOSPITAL COURSE:   1.  Complete heart block with severe bradycardia.  Transfer to [facility] for pacemaker today.  Holding coreg. Gave calcium yesterday.  Hr still in 30's. 2. Acute CHF secondary to poor perfusion and slow HR.  Lasix 40mg  iv bid.  Echo ordered. No B-blocker with heart block. Lisinopril. 3. Permanent atrial fibrillation.  Holding coumadin.  Vit K given yesterday.  Inr pending this am. 4. Accelerated hypertension on presentation. On lasix and lisinopril 5. Hyperlipidemia on zocor 6. BPH on doxazosin 7. Type 2 diabetes on amaryl.  Check sugars qac and qhs  DISCHARGE CONDITIONS:   fair  CONSULTS OBTAINED:  Treatment Team:  Yvonne Kendall, MD  DRUG ALLERGIES:   Allergies  Allergen Reactions  . Methyldopa Nausea Only and Palpitations    Other reaction(s): Unknown Other reaction(s): NAUSEA 'palpitation" Other reaction(s): NAUSEA Other reaction(s): NAUSEA     DISCHARGE MEDICATIONS:   Allergies as of 03/25/2018      Reactions   Methyldopa Nausea Only, Palpitations   Other  reaction(s): Unknown Other reaction(s): NAUSEA 'palpitation" Other reaction(s): NAUSEA Other reaction(s): NAUSEA      Medication List    STOP taking these medications   amLODipine 2.5 MG tablet Commonly known as:  NORVASC   carvedilol 25 MG tablet Commonly known as:  COREG   furosemide 80 MG tablet Commonly known as:  LASIX Replaced by:  furosemide 10 MG/ML injection   metFORMIN 1000 MG tablet Commonly known as:  GLUCOPHAGE   Potassium 99 MG Tabs   warfarin 2 MG tablet Commonly known as:  COUMADIN     TAKE these medications   diphenhydrAMINE 25 mg capsule Commonly known as:  BENADRYL Take 25 mg by mouth as needed for itching.   doxazosin 4 MG tablet Commonly known as:  CARDURA Take 4 mg by mouth Nightly.   furosemide 10 MG/ML injection Commonly known as:  LASIX Inject 4 mLs (40 mg total) into the vein 2 (two) times daily. Replaces:  furosemide 80 MG tablet   glimepiride 2 MG tablet Commonly known as:  AMARYL Take 4 mg by mouth daily.   lisinopril 20 MG tablet Commonly known as:  PRINIVIL,ZESTRIL Take 1 tablet (20 mg total) by mouth daily.   simvastatin 20 MG tablet Commonly known as:  ZOCOR Take 20 mg by mouth Nightly.   vitamin B-12 1000 MCG tablet Commonly known as:  CYANOCOBALAMIN Take 1,000 mcg by mouth daily.        DISCHARGE INSTRUCTIONS:   F/u at [facility] same day  If you experience worsening of your admission symptoms, develop shortness of breath,  life threatening emergency, suicidal or homicidal thoughts you must seek medical attention immediately by calling 911 or calling your MD immediately  if symptoms less severe.  You Must read complete instructions/literature along with all the possible adverse reactions/side effects for all the Medicines you take and that have been prescribed to you. Take any new Medicines after you have completely understood and accept all the possible adverse reactions/side effects.   Please note  You were  cared for by a hospitalist during your hospital stay. If you have any questions about your discharge medications or the care you received while you were in the hospital after you are discharged, you can call the unit and asked to speak with the hospitalist on call if the hospitalist that took care of you is not available. Once you are discharged, your primary care physician will handle any further medical issues. Please note that NO REFILLS for any discharge medications will be authorized once you are discharged, as it is imperative that you return to your primary care physician (or establish a relationship with a primary care physician if you do not have one) for your aftercare needs so that they can reassess your need for medications and monitor your lab values.    Today   CHIEF COMPLAINT:   Chief Complaint  Patient presents with  . Shortness of Breath    HISTORY OF PRESENT ILLNESS:  Andrew [name]  is a 83 y.o. male came in with shortness of breath   VITAL SIGNS:  Blood pressure (!) 161/49, pulse 69, temperature 98.4 F (36.9 C), temperature source Oral, resp. rate 18, height 5\' 10"  (1.778 m), weight 82.7 kg, SpO2 94 %.  I/O:    Intake/Output Summary (Last 24 hours) at 03/25/2018 1601 Last data filed at 03/25/2018 0800 Gross per 24 hour  Intake 45.89 ml  Output 1800 ml  Net -1754.11 ml    PHYSICAL EXAMINATION:  GENERAL:  83 y.o.-year-old patient lying in the bed with no acute distress.  EYES: Pupils equal, round, reactive to light and accommodation. No scleral icterus. Extraocular muscles intact.  HEENT: Head atraumatic, normocephalic. Oropharynx and nasopharynx clear.  NECK:  Supple, no jugular venous distention. No thyroid enlargement, no tenderness.  LUNGS: decreasesd breath sounds bilateral, slight expiratory wheezing, positive rales lower lung fields. No use of accessory muscles of respiration.  CARDIOVASCULAR: S1, S2 bradycardia. No murmurs, rubs, or gallops.  ABDOMEN:  Soft, non-tender, non-distended. Bowel sounds present. No organomegaly or mass.  EXTREMITIES: trace pedal edema. No cyanosis, or clubbing.  NEUROLOGIC: Cranial nerves II through XII are intact. Muscle strength 5/5 in all extremities. Sensation intact. Gait not checked.  PSYCHIATRIC: The patient is alert and oriented x 3.  SKIN: No obvious rash, lesion, or ulcer.   DATA REVIEW:   CBC Recent Labs  Lab 03/23/18 1727  WBC 6.5  HGB 11.8*  HCT 37.0*  PLT 91*    Chemistries  Recent Labs  Lab 03/23/18 1727  NA 140  K 4.1  CL 105  CO2 25  GLUCOSE 295*  BUN 26*  CREATININE 0.99  CALCIUM 9.2    Cardiac Enzymes Recent Labs  Lab 03/24/18 1229  TROPONINI 0.07*      RADIOLOGY:  Dg Chest Portable 1 View  Result Date: 03/23/2018 CLINICAL DATA:  New onset CHF. EXAM: PORTABLE CHEST 1 VIEW COMPARISON:  None. FINDINGS: 1843 hours. Low lung volumes accentuated by lordotic positioning. The cardio pericardial silhouette is enlarged. There is pulmonary vascular congestion without overt pulmonary edema. No  substantial pleural effusion or focal airspace consolidation. The visualized bony structures of the thorax are intact. Telemetry leads overlie the chest. IMPRESSION: Low volumes with cardiomegaly and vascular congestion. No overt pulmonary edema. Electronically Signed   By: Kennith Center M.D.   On: 03/23/2018 19:01      Management plans discussed with the patient, family and they are in agreement.  CODE STATUS:     Code Status Orders  (From admission, onward)         Start     Ordered   03/24/18 1202  Full code  Continuous     03/24/18 1201        Code Status History    This patient has a current code status but no historical code status.      TOTAL TIME TAKING CARE OF THIS PATIENT: 35 minutes.    Alford Highland M.D on 03/25/2018 at 8:22 AM  Between 7am to 6pm - Pager - [phone]  After 6pm go to [email or web address] - Social research officer, government  [employer] [facility] Office   [phone]  CC: Primary care physician; Raynelle Bring

## 2018-03-25 NOTE — Progress Notes (Signed)
Orthopedic Tech Progress Note Patient Details:  Andrew [name] 25-Dec-1935 [id number]  Ortho Devices Type of Ortho Device: Arm sling Ortho Device/Splint Interventions: Ordered, Application, Adjustment   Post Interventions Patient Tolerated: Well Instructions Provided: Adjustment of device, Care of device   Andrew [name] Andrew [name] 03/25/2018, 2:30 PM

## 2018-03-25 NOTE — Progress Notes (Signed)
Patient transferred to [facility] for placement of pacemaker.  Report called and given to cath lab.  Wife at bedside. Patient sent via [facility] on stretcher.  Patient without complaints prior to transfer.

## 2018-03-25 NOTE — H&P (Addendum)
History & Physical    Patient ID: Andrew [name] MRN: [id number], DOB/AGE: 81/08/1935   Admit date: (Not on file)   Primary Physician: [facility] Primary Cardiologist: Yvonne Kendallhristopher End, MD  Patient Profile    83 y.o. male with a history of permanent afib, htn, hl, chf, dm, and remote tob abuse, who was admitted 2/19 with progressive dyspnea and bradycardia/slow afib.  Past Medical History    Past Medical History:  Diagnosis Date  . CHF (congestive heart failure) (HCC)   . Diabetes mellitus without complication (HCC)   . Essential hypertension   . Mixed hyperlipidemia   . Permanent atrial fibrillation    a. s/p catheter ablation ~ 12 yrs ago @ [facility] per family->unsuccessful; b. CHA2DS2VASc = 5-->chronic coumadin.    No past surgical history on file.   Allergies  Allergies  Allergen Reactions  . Methyldopa Nausea Only and Palpitations    Other reaction(s): Unknown Other reaction(s): NAUSEA 'palpitation" Other reaction(s): NAUSEA Other reaction(s): NAUSEA     History of Present Illness    83 y/o ? w/ the above PMH including Afib, HTN, HL, DMII, and CHF.  He was previously seen by cardiology in [place] and his family notes that he is s/p catheter ablation for Afib ~ 10-12 yrs ago.  To their knowledge, this was unsuccessful w/ ERAF shortly after the procedure.  They believe that he has been in permanent afib since.  He is chronically anticoagulated on warfarin, w/ INR's followed closely by his PCP locally.  He has not seen cardiology in many years.  [name] reports a chronic cough w/ intermittent dyspnea, mostly noted when speaking, for which he takes 3 cough drops nightly.  Otw, he reports having been in good health.  Review of outpt primary care notes shows that in the late summer of 2019, he required down-titration of carvedilol from 25mg  BID to 12.5mg  BID in the setting of bradycardia in the 50's and a feeling of low energy.  Symptoms/HR improved  following this change.  He was in his USOH until ~ 1 wk ago, when he began to notice worsening dyspnea w/ speech and DOE.  His family members report that he seemed to be more weak than usual and even fell on 2 occasions when walking and reaching out to steady himself but then missing the thing he was reaching for, losing his balance, and falling.  He did not suffer any trauma or syncope, however, [name] has noted intermittent lightheadedness over the past 2-3 days.  He denies chest pain, palpitations, pnd, orthopnea, n, v, edema, weight gain, or early satiety. On 2/19, he saw his PCP and was found to be bradycardic with a rate of 35.  In that setting, he was referred to the [facility] ED, where he had persistent bradycardia with evidence for complete heart block and underlying afib.  BNP was mildly elevated @ 314, while trop was mildly elevated @ 0.06.  CXR showed pulm vascular congestion.  He was treated w/ 1 dose of IV lasix and admitted to internal medicine.  ? blocker therapy (last dose 2/19 AM) and amlodipine (last dose 2/18 PM) have been held.  HR's have remained in the mid 30's with fairly regular ventricular response.  He has been hemodynamically stable and in fact, hypertensive.  Troponins have remained mildly elevated w/ a flat trend (0.06  0.06  0.07  0.07). He has had significant improvement in resp status w/ breathing w/ diuresis, but despite holding of  blocker (carvedilol - last dose AM of 2/19) and CCB (amlodipine - last dose PM of 2/18), he has remained bradycardic w/ rates between ~ 34-37.  As a result, he will be tx to [facility] today for EP eval and PPM.  Current Inpatient Medications [facility])    Scheduled Meds: . doxazosin  4 mg Oral Nightly  . glimepiride  4 mg Oral Daily  . insulin aspart  0-5 Units Subcutaneous QHS  . insulin aspart  0-9 Units Subcutaneous TID WC  . lisinopril  20 mg Oral Daily  . mouth rinse  15 mL Mouth Rinse BID  . simvastatin  20 mg Oral Nightly  . sodium chloride  flush  3 mL Intravenous Q12H  . vitamin B-12  1,000 mcg Oral Daily   Family History    Family History  Problem Relation Age of Onset  . CVA Mother   . Bladder Cancer Father    He indicated that his mother is deceased. He indicated that his father is deceased.   Social History    Social History   Socioeconomic History  . Marital status: Married    Spouse name: Not on file  . Number of children: Not on file  . Years of education: Not on file  . Highest education level: Not on file  Occupational History  . Not on file  Social Needs  . Financial resource strain: Not on file  . Food insecurity:    Worry: Not on file    Inability: Not on file  . Transportation needs:    Medical: Not on file    Non-medical: Not on file  Tobacco Use  . Smoking status: Former Smoker    Packs/day: 1.50    Years: 35.00    Pack years: 52.50  . Smokeless tobacco: Never Used  Substance and Sexual Activity  . Alcohol use: Yes    Comment: 1.5 beers a month  . Drug use: Never  . Sexual activity: Not on file  Lifestyle  . Physical activity:    Days per week: Not on file    Minutes per session: Not on file  . Stress: Not on file  Relationships  . Social connections:    Talks on phone: Not on file    Gets together: Not on file    Attends religious service: Not on file    Active member of club or organization: Not on file    Attends meetings of clubs or organizations: Not on file    Relationship status: Not on file  . Intimate partner violence:    Fear of current or ex partner: Not on file    Emotionally abused: Not on file    Physically abused: Not on file    Forced sexual activity: Not on file  Other Topics Concern  . Not on file  Social History Narrative   Lives locally with wife.  Dtr lives nearby and helps out.  Does not routinely exercise.     Review of Systems    General:  No chills, fever, night sweats or weight changes.  Cardiovascular:  No chest pain, +++ dyspnea on  exertion, +++ edema, +++ orthopnea, +++ presyncope. No palpitations, paroxysmal nocturnal dyspnea. Dermatological: No rash, lesions/masses Respiratory: +++ chronic cough and recent dyspnea Urologic: No hematuria, dysuria Abdominal:   No nausea, vomiting, diarrhea, bright red blood per rectum, melena, or hematemesis Neurologic:  No visual changes, wkns, changes in mental status. All other systems reviewed and are otherwise negative except as noted  above.  Physical Exam    161/49, 69, 18, 98.4, 94% (2lpm). Wt 82.7kg.  General: Pleasant, NAD Psych: Normal affect. Neuro: Alert and oriented X 3. Moves all extremities spontaneously. HEENT: HOH. Neck: Supple without bruits or JVD. Lungs:  Resp regular and unlabored, diminished breath sounds bilat w/ basilar crackles and faint insp/exp wheezing. Heart: Irreg, brady, no s3, s4, 2/6 diast murmur @ apex. Abdomen: Soft, non-tender, non-distended, BS + x 4.  Extremities: No clubbing, cyanosis.  Trace to 1+ bilat LE edema. DP/PT/Radials 1+ and equal bilaterally.  Labs    Troponin  Recent Labs    03/23/18 1727 03/24/18 0333 03/24/18 1115 03/24/18 1229  TROPONINI 0.06* 0.06* 0.07* 0.07*   Lab Results  Component Value Date   WBC 4.1 03/25/2018   HGB 10.9 (L) 03/25/2018   HCT 34.4 (L) 03/25/2018   MCV 100.3 (H) 03/25/2018   PLT 84 (L) 03/25/2018    Recent Labs  Lab 03/25/18 0804  NA 143  K 3.1*  CL 106  CO2 31  BUN 19  CREATININE 0.74  CALCIUM 8.7*  GLUCOSE 178*    Lab Results  Component Value Date   INR 1.39 03/25/2018   INR 2.49 03/23/2018   INR 2.03 01/21/2017    Radiology Studies    Dg Chest Portable 1 View  Result Date: 03/23/2018 CLINICAL DATA:  New onset CHF. EXAM: PORTABLE CHEST 1 VIEW COMPARISON:  None. FINDINGS: 1843 hours. Low lung volumes accentuated by lordotic positioning. The cardio pericardial silhouette is enlarged. There is pulmonary vascular congestion without overt pulmonary edema. No substantial  pleural effusion or focal airspace consolidation. The visualized bony structures of the thorax are intact. Telemetry leads overlie the chest. IMPRESSION: Low volumes with cardiomegaly and vascular congestion. No overt pulmonary edema. Electronically Signed   By: Kennith Center M.D.   On: 03/23/2018 19:01    ECG & Cardiac Imaging    Afib, 36, left axis, rbbb - personally reviewed. Tele - afib, 30's  Assessment & Plan    1.  Afib w/ slow ventricular response/symptomatic bradycardia:  He remains in slow afib with rates in the 30's despite holding ? blocker.  Last dose of  blocker was the AM of 2/19.  He has been hemodynamically stable and in fact, hypertensive.  We have discussed his case with our EP team @ [facility].  Echo to be done now.  Will plan on tx to [facility] today for PPM placement by Dr. Johney Frame.  He will need a progressive bed.  2.  Acute CHF: In setting of above.  Minus 1.6L overnight and 2.1L since admission.  Wt likely inaccurate as listed up 1.1 kg.  Breathing much improved.  Still with mild volume overload on exam.  Echo being done now - will obtain read as soon as available.  Creat stable.  Cont acei and diuretic rx.  No  blocker in setting of above.  3.  Elevated troponin: No h/o c/p. Likely demand ischemia in the setting of above.  Trop 0.06  0.06  0.07  0.07.  Echo pending.  No asa in setting of chronic coumadin (on hold). No  blocker 2/2 bradycardia. Pending echo, will likely require ischemic eval.  4.  Essential HTN: BP remains elevated off of  blocker and amlodipine.  Will titrate lisinopril.   5.  DMII: A1c 8.7 in 01/2018.  Cont SSI.  6.  HL: LDL 45 in 03/2017.  Cont statin.  7.  Hypokalemia:  Labs just came back - K  3.1.  Nurse provided 40 meq PO just prior to [facility] departure.   Signed, Nicolasa Ducking, NP 03/25/2018, 8:57 AM EP has been consulted by Dr Mariah Milling for evaluation of bradycardia.  I have seen, examined the patient, and reviewed the above assessment and  plan.  Changes to above are made where necessary.  On exam, bradycardic, mostly regular rhythm.  He is admitted with symptomatic bradycardia.  He has permanent afib.  EF is 50-55% by echo done this am. I would therefore recommend pacemaker implantation at this time.  Risks, benefits, alternatives to pacemaker implantation were discussed in detail with the patient today. The patient understands that the risks include but are not limited to bleeding, infection, pneumothorax, perforation, tamponade, vascular damage, renal failure, MI, stroke, death,  and lead dislodgement and wishes to proceed at this time.  Plan to resume coumadin post implant.  Medicine optimization one pacemaker is in place.  Currently on droplet precautions.   Co Sign: Hillis Range, MD 03/25/2018 10:34 AM

## 2018-03-25 NOTE — Plan of Care (Signed)
  Problem: Clinical Measurements: Goal: Respiratory complications will improve Outcome: Progressing Note:  Remains on 2L O2 which is acute, still shortness of breath noted with exerting    Problem: Activity: Goal: Risk for activity intolerance will decrease Outcome: Progressing   Problem: Nutrition: Goal: Adequate nutrition will be maintained Outcome: Progressing   Problem: Coping: Goal: Level of anxiety will decrease Outcome: Progressing   Problem: Elimination: Goal: Will not experience complications related to urinary retention Outcome: Progressing   Problem: Pain Managment: Goal: General experience of comfort will improve Outcome: Progressing Note:  No complaints of pain this shift   Problem: Safety: Goal: Ability to remain free from injury will improve Outcome: Progressing   Problem: Activity: Goal: Capacity to carry out activities will improve Outcome: Progressing Note:  HR still remains 30's-40's

## 2018-03-25 NOTE — Progress Notes (Signed)
Received pt alert and oriented X $, skin warm and dry.  Pt arrived form [facility] via [facility].Pt put on monitor, Dr Johney Frame in to see pt and consent was signed.  Condom cath applied and SL started in left wrist.  Pt to procedure area for PPM procedure via strecther.

## 2018-03-25 NOTE — Discharge Instructions (Signed)
° ° °  Supplemental Discharge Instructions for  Pacemaker/Defibrillator Patients  Activity No heavy lifting or vigorous activity with your left/right arm for 6 to 8 weeks.  Do not raise your left/right arm above your head for one week.  Gradually raise your affected arm as drawn below.            03/29/2018                  03/30/2018                03/31/2018              04/01/2018 __  NO DRIVING for  1 week   ; you may begin driving on  9/48/5462 .  WOUND CARE - Keep the wound area clean and dry.  Do not get this area wet, no showers until cleared to at your wound check visit . - The tape/steri-strips on your wound will fall off; do not pull them off.  No bandage is needed on the site.  DO  NOT apply any creams, oils, or ointments to the wound area. - If you notice any drainage or discharge from the wound, any swelling or bruising at the site, or you develop a fever > 101? F after you are discharged home, call the office at once.  Special Instructions - You are still able to use cellular telephones; use the ear opposite the side where you have your pacemaker/defibrillator.  Avoid carrying your cellular phone near your device. - When traveling through airports, show security personnel your identification card to avoid being screened in the metal detectors.  Ask the security personnel to use the hand wand. - Avoid arc welding equipment, MRI testing (magnetic resonance imaging), TENS units (transcutaneous nerve stimulators).  Call the office for questions about other devices. - Avoid electrical appliances that are in poor condition or are not properly grounded. - Microwave ovens are safe to be near or to operate.

## 2018-03-25 NOTE — Progress Notes (Signed)
*  PRELIMINARY RESULTS* Echocardiogram 2D Echocardiogram has been performed.  Joanette Gula Cecilia Nishikawa 03/25/2018, 8:51 AM

## 2018-03-25 NOTE — Interval H&P Note (Signed)
History and Physical Interval Note:  03/25/2018 10:37 AM  Andrew [name]  has presented today for surgery, with the diagnosis of hb  The various methods of treatment have been discussed with the patient and family. After consideration of risks, benefits and other options for treatment, the patient has consented to  Procedure(s): PACEMAKER IMPLANT (N/A) as a surgical intervention .  The patient's history has been reviewed, patient examined, no change in status, stable for surgery.  I have reviewed the patient's chart and labs.  Questions were answered to the patient's satisfaction.     Hillis Range

## 2018-03-25 NOTE — Discharge Instructions (Signed)

## 2018-03-25 NOTE — Progress Notes (Signed)
PT Cancellation Note  Patient Details Name: Andrew [name] MRN: [id number] DOB: [birth year]   Cancelled Treatment:    Reason Eval/Treat Not Completed: Other (comment)(cardiology reporting tranfer to [facility] for PPM placement. WIll complete signoff and await new PT order. )  9:39 AM, 03/25/18 [name], PT, DPT Physical Therapist - [facility]  [phone] (ASCOM)     [name] 03/25/2018, 9:39 AM

## 2018-03-26 ENCOUNTER — Encounter (HOSPITAL_COMMUNITY): Payer: Self-pay

## 2018-03-26 ENCOUNTER — Other Ambulatory Visit: Payer: Self-pay

## 2018-03-26 ENCOUNTER — Inpatient Hospital Stay (HOSPITAL_COMMUNITY): Payer: Medicare HMO

## 2018-03-26 LAB — BASIC METABOLIC PANEL
Anion gap: 7 (ref 5–15)
BUN: 18 mg/dL (ref 8–23)
CHLORIDE: 105 mmol/L (ref 98–111)
CO2: 33 mmol/L — ABNORMAL HIGH (ref 22–32)
Calcium: 8.8 mg/dL — ABNORMAL LOW (ref 8.9–10.3)
Creatinine, Ser: 0.82 mg/dL (ref 0.61–1.24)
GFR calc Af Amer: >60 mL/min (ref >60)
GFR calc non Af Amer: >60 mL/min (ref >60)
Glucose, Bld: 147 mg/dL — ABNORMAL HIGH (ref 70–99)
Potassium: 3.8 mmol/L (ref 3.5–5.1)
Sodium: 145 mmol/L (ref 135–145)

## 2018-03-26 LAB — PROTIME-INR
INR: 1.29
Prothrombin Time: 15.9 seconds — ABNORMAL HIGH (ref 11.4–15.2)

## 2018-03-26 LAB — GLUCOSE, CAPILLARY
Glucose-Capillary: 145 mg/dL — ABNORMAL HIGH (ref 70–99)
Glucose-Capillary: 263 mg/dL — ABNORMAL HIGH (ref 70–99)

## 2018-03-26 MED ORDER — LISINOPRIL 40 MG PO TABS: 40.0000 mg | ORAL_TABLET | Freq: Every day | ORAL | 1 refills | Status: DC

## 2018-03-26 MED ORDER — WARFARIN SODIUM 2 MG PO TABS: 2.0000 mg | ORAL_TABLET | Freq: Once | ORAL | Status: DC

## 2018-03-26 NOTE — Progress Notes (Signed)
ANTICOAGULATION CONSULT NOTE - Initial Consult  Pharmacy Consult for warfarin dosing Indication: atrial fibrillation  Allergies  Allergen Reactions  . Methyldopa Nausea Only and Palpitations    Other reaction(s): Unknown Other reaction(s): NAUSEA 'palpitation" Other reaction(s): NAUSEA Other reaction(s): NAUSEA     Patient Measurements: Height: 5\' 10"  (177.8 cm) Weight: 179 lb 11.2 oz (81.5 kg) IBW/kg (Calculated) : 73 Heparin Dosing Weight: n/a  Vital Signs: Temp: 98.4 F (36.9 C) (02/22 0710) Temp Source: Oral (02/22 0710) BP: 175/71 (02/22 0729) Pulse Rate: 58 (02/22 0729)  Labs: Recent Labs    03/23/18 1727 03/23/18 2023 03/24/18 0333 03/24/18 1115 03/24/18 1229 03/25/18 0804 03/26/18 0417  HGB 11.8*  --   --   --   --  10.9*  --   HCT 37.0*  --   --   --   --  34.4*  --   PLT 91*  --   --   --   --  84*  --   LABPROT  --  26.6*  --   --   --  16.9* 15.9*  INR  --  2.49  --   --   --  1.39 1.29  CREATININE 0.99  --   --   --   --  0.74 0.82  TROPONINI 0.06*  --  0.06* 0.07* 0.07*  --   --     Estimated Creatinine Clearance: 74.2 mL/min (by C-G formula based on SCr of 0.82 mg/dL).   Medical History: Past Medical History:  Diagnosis Date  . CHF (congestive heart failure) (HCC)   . Diabetes mellitus without complication (HCC)   . Essential hypertension   . Mixed hyperlipidemia   . Permanent atrial fibrillation    a. s/p catheter ablation ~ 12 yrs ago @ [facility] per family->unsuccessful; b. CHA2DS2VASc = 5-->chronic coumadin.    Medications:  Home dose 2 mg Monday-Friday; 1 mg Saturday and Sunday  Assessment: 83 year old male with history of permanent afib on warfarin prior to admission. INR therapeutic on admission to [facility], now s/p ppm on 2/21. INR has trended down from 1.39 to 1.29 this AM. Of note, patient received 1 mg vitamin k on 03/24/18 and warfarin was held 2/19 and 2/20 for pacemaker implant, explaining subtherapeutic INR.  No bleeding or  hematoma issues reported per RN. Hgb stable 10.9, plt low at baseline and currently 84.   Goal of Therapy:  INR 2-3  Plan:  Warfarin 2mg  tonight Daily INR for now  Thank you for allowing pharmacy to be a part of this patient's care.  Lenord Carbo, PharmD PGY1 Pharmacy Resident Phone: [phone]  Please check AMION for all [facility] Pharmacy phone numbers 03/26/2018 7:49 AM

## 2018-03-26 NOTE — Progress Notes (Signed)
Call placed to [facility] to notify of telemetry monitoring d/c.   

## 2018-03-26 NOTE — Progress Notes (Signed)
Progress Note  Patient Name: Andrew [name] Date of Encounter: 03/26/2018  Primary Cardiologist: Yvonne Kendall, MD   Subjective   No chest pain or sob.  Inpatient Medications    Scheduled Meds: . doxazosin  4 mg Oral QHS  . furosemide  40 mg Intravenous BID  . glimepiride  4 mg Oral Q breakfast  . insulin aspart  0-15 Units Subcutaneous TID WC  . lisinopril  40 mg Oral Daily  . potassium chloride  40 mEq Oral BID  . simvastatin  40 mg Oral q1800  . sodium chloride flush  3 mL Intravenous Q12H  . warfarin  2 mg Oral ONCE-1800  . Warfarin - Pharmacist Dosing Inpatient   Does not apply q1800   Continuous Infusions: . sodium chloride     PRN Meds: sodium chloride, HYDROcodone-acetaminophen, ondansetron (ZOFRAN) IV, sodium chloride flush   Vital Signs    Vitals:   03/26/18 0710 03/26/18 0729 03/26/18 0832 03/26/18 1125  BP:  (!) 175/71 (!) 176/74 (!) 161/69  Pulse:  (!) 58  (!) 46  Resp:  19    Temp: 98.4 F (36.9 C)   99.6 F (37.6 C)  TempSrc: Oral   Oral  SpO2:  90%  91%  Weight:      Height:        Intake/Output Summary (Last 24 hours) at 03/26/2018 1150 Last data filed at 03/26/2018 1001 Gross per 24 hour  Intake 940 ml  Output 3025 ml  Net -2085 ml   Filed Weights   03/25/18 1221 03/26/18 0600  Weight: 85.4 kg 81.5 kg    Telemetry    Atrial fib with ventricular pacing - Personally Reviewed  ECG    none - Personally Reviewed  Physical Exam   GEN: No acute distress.   Neck: No JVD Cardiac: IReg ireg, no murmurs, rubs, or gallops.  Respiratory: Clear to auscultation bilaterally. PM incision looks good. GI: Soft, nontender, non-distended  MS: No edema; No deformity. Neuro:  Nonfocal  Psych: Normal affect   Labs    Chemistry Recent Labs  Lab 03/23/18 1727 03/25/18 0804 03/26/18 0417  NA 140 143 145  K 4.1 3.1* 3.8  CL 105 106 105  CO2 25 31 33*  GLUCOSE 295* 178* 147*  BUN 26* 19 18  CREATININE 0.99 0.74 0.82  CALCIUM 9.2  8.7* 8.8*  GFRNONAA >60 >60 >60  GFRAA >60 >60 >60  ANIONGAP 10 6 7      Hematology Recent Labs  Lab 03/23/18 1727 03/25/18 0804  WBC 6.5 4.1  RBC 3.70* 3.43*  HGB 11.8* 10.9*  HCT 37.0* 34.4*  MCV 100.0 100.3*  MCH 31.9 31.8  MCHC 31.9 31.7  RDW 14.9 14.8  PLT 91* 84*    Cardiac Enzymes Recent Labs  Lab 03/23/18 1727 03/24/18 0333 03/24/18 1115 03/24/18 1229  TROPONINI 0.06* 0.06* 0.07* 0.07*   No results for input(s): TROPIPOC in the last 168 hours.   BNP Recent Labs  Lab 03/23/18 1727  BNP 314.0*     DDimer No results for input(s): DDIMER in the last 168 hours.   Radiology    Dg Chest 2 View  Result Date: 03/26/2018 CLINICAL DATA:  Pacemaker placement EXAM: CHEST - 2 VIEW COMPARISON:  03/23/2018 FINDINGS: Cardiomegaly again noted. A LEFT subclavian pacemaker is noted with tip overlying the region of the RIGHT ventricle. There is no evidence of pneumothorax. Small probable pleural effusions and bibasilar opacities/atelectasis noted. No acute bony abnormalities. IMPRESSION: LEFT subclavian pacemaker  placement.  No pneumothorax. Bibasilar opacities/atelectasis and probable small bilateral pleural effusions Electronically Signed   By: Harmon Pier M.D.   On: 03/26/2018 07:43    Cardiac Studies   none  Patient Profile     83 y.o. male admitted with CHB, s/p single chamber PPM insertion.  Assessment & Plan    1. CHB - he is doing well, s/p PPM insertion. He is stable for DC 2. PPM - his [employer] single chamber PPM is working normally. Interogation under my direction demonstrates normal VVI PM function. 3. Atrial fib - his rates are controlled. He will continue his current meds. He can restart his coumadin tomorrow. 4. Disp. - he is stable for DC. Usual PPM followup. He will need to see Dr. Crissie Sickles in [place] in 3 months. Wound check in our office at [facility] in 1-2 weeks.   For questions or updates, please contact [facility] Please consult  [email or web address] for contact info under Cardiology/STEMI.      Signed, Lewayne Bunting, MD  03/26/2018, 11:50 AM  Patient ID: Andrew [name], male   DOB: 11/30/1935, 83 y.o.   MRN: [id number]

## 2018-03-26 NOTE — Discharge Summary (Addendum)
Discharge Summary    Patient ID: Andrew [name],  MRN: [id number], DOB/AGE: 1935/10/29 83 y.o.  Admit date: 03/25/2018 Discharge date: 03/26/2018  Primary Care Provider: Raynelle Bring Primary Cardiologist: Dr. Okey Dupre   Discharge Diagnoses    Active Problems:   Symptomatic bradycardia   Allergies Allergies  Allergen Reactions  . Methyldopa Nausea Only and Palpitations    Other reaction(s): Unknown Other reaction(s): NAUSEA 'palpitation" Other reaction(s): NAUSEA Other reaction(s): NAUSEA     Diagnostic Studies/Procedures    N/a  _____________   History of Present Illness     25 y/omalew/ the above PMH including Afib, HTN, HL, DMII, and CHF. He was previously seen by cardiology in [place] and his family noted that he is s/p catheter ablation for Afib ~ 10-12 yrs ago. To their knowledge, this was unsuccessful w/ ERAF shortly after the procedure. They believe that he has been in permanent afib since. He is chronically anticoagulated on warfarin, w/ INR's followed closely by his PCP locally. He had not seen cardiology in many years. Mr. Kerkhoff reported a chronic cough w/ intermittent dyspnea, mostly noted when speaking, for which he was taking 3 cough drops nightly. Otw, he reported having been in good health. Review of outpt primary care notes showed that in the late summer of 2019, he required down-titration of carvedilol from 25mg  BID to 12.5mg  BID in the setting of bradycardia in the 50's and a feeling of low energy. Symptoms/HR improved following this change.  He was in his USOH until ~ 1 wk ago, when he began to notice worsening dyspnea w/ speech and DOE. His family members reported that he seemed to be more weak than usual and even fell on 2 occasions when walking and reaching out to steady himself but then missing the thing he was reaching for, losing his balance, and falling. He did not suffer any trauma or syncope, however, Mr. Howald had noted  intermittent lightheadedness over the past 2-3 days. He denied chest pain, palpitations, pnd, orthopnea, n/v, edema, weight gain, or early satiety.On 2/19, he saw his PCP and was found to be bradycardic with a rate of 35. In that setting, he was referred to the [facility] ED, where he had persistent bradycardia with evidence for complete heart block and underlying afib. BNP was mildly elevated @ 314, while trop was mildly elevated @ 0.06. CXR showed pulm vascular congestion. He was treated w/ 1 dose of IV lasix and admitted to internal medicine. ?blocker therapy (last dose 2/19 AM) and amlodipine (last dose 2/18 PM) have been held. HR's have remained in the mid 30's with fairly regular ventricular response. He remained hemodynamically stable and in fact, hypertensive. Troponins remained mildly elevated w/ a flat trend (0.06 [id number]). He had significant improvement in resp status w/ breathing w/ diuresis, but despite holding of ? blocker (carvedilol - last dose AM of 2/19) and CCB (amlodipine - last dose PM of 2/18), he has remained bradycardic w/ rates between ~ 34-37.  As a result, he was transferred to [facility] for EP eval and PPM.  Hospital Course     He was evaluated by Dr. Johney Frame and deemed appropriate for PPM placement. He was given a dose of Vit K 1mg  prior to procedure. Underwent successful PPM placed on 03/25/2018. Interrogation showed normal VVI PM function. His home lisinopril was increased for better blood pressure control. CXR post PPM implant was stable. Per Dr. Ladona Ridgel will resume his coumadin tomorrow. Have asked that he have an  INR check first of the week with his PCP.   Andrew [name] was seen by Dr. Ladona Ridgel and determined stable for discharge home. Follow up in the office has been arranged. Medications are listed below.   _____________  Discharge Vitals Blood pressure (!) 161/69, pulse (!) 46, temperature 99.6 F (37.6 C), temperature source Oral, resp. rate 19, height 5\' 10"   (1.778 m), weight 81.5 kg, SpO2 91 %.  Filed Weights   03/25/18 1221 03/26/18 0600  Weight: 85.4 kg 81.5 kg    Labs & Radiologic Studies    CBC Recent Labs    03/23/18 1727 03/25/18 0804  WBC 6.5 4.1  HGB 11.8* 10.9*  HCT 37.0* 34.4*  MCV 100.0 100.3*  PLT 91* 84*   Basic Metabolic Panel Recent Labs    16/01/09 0804 03/26/18 0417  NA 143 145  K 3.1* 3.8  CL 106 105  CO2 31 33*  GLUCOSE 178* 147*  BUN 19 18  CREATININE 0.74 0.82  CALCIUM 8.7* 8.8*   Liver Function Tests No results for input(s): AST, ALT, ALKPHOS, BILITOT, PROT, ALBUMIN in the last 72 hours. No results for input(s): LIPASE, AMYLASE in the last 72 hours. Cardiac Enzymes Recent Labs    03/24/18 0333 03/24/18 1115 03/24/18 1229  TROPONINI 0.06* 0.07* 0.07*   BNP Invalid input(s): POCBNP D-Dimer No results for input(s): DDIMER in the last 72 hours. Hemoglobin A1C No results for input(s): HGBA1C in the last 72 hours. Fasting Lipid Panel No results for input(s): CHOL, HDL, LDLCALC, TRIG, CHOLHDL, LDLDIRECT in the last 72 hours. Thyroid Function Tests Recent Labs    03/23/18 1727  TSH 2.249   _____________  Dg Chest 2 View  Result Date: 03/26/2018 CLINICAL DATA:  Pacemaker placement EXAM: CHEST - 2 VIEW COMPARISON:  03/23/2018 FINDINGS: Cardiomegaly again noted. A LEFT subclavian pacemaker is noted with tip overlying the region of the RIGHT ventricle. There is no evidence of pneumothorax. Small probable pleural effusions and bibasilar opacities/atelectasis noted. No acute bony abnormalities. IMPRESSION: LEFT subclavian pacemaker placement.  No pneumothorax. Bibasilar opacities/atelectasis and probable small bilateral pleural effusions Electronically Signed   By: Harmon Pier M.D.   On: 03/26/2018 07:43   Dg Chest Portable 1 View  Result Date: 03/23/2018 CLINICAL DATA:  New onset CHF. EXAM: PORTABLE CHEST 1 VIEW COMPARISON:  None. FINDINGS: 1843 hours. Low lung volumes accentuated by lordotic  positioning. The cardio pericardial silhouette is enlarged. There is pulmonary vascular congestion without overt pulmonary edema. No substantial pleural effusion or focal airspace consolidation. The visualized bony structures of the thorax are intact. Telemetry leads overlie the chest. IMPRESSION: Low volumes with cardiomegaly and vascular congestion. No overt pulmonary edema. Electronically Signed   By: Kennith Center M.D.   On: 03/23/2018 19:01   Disposition   Pt is being discharged home today in good condition.  Follow-up Plans & Appointments    Follow-up Information    [facility] Office Follow up.   Specialty:  Cardiology Why:  you will be called to make a wound check visit, this shoud be done within the next 10-14 days Contact information: [address], [address] [place] [address] [phone]       Hillis Range, MD Follow up.   Specialty:  Cardiology Why:  You will be called by Dr. Jenel Lucks scheduler to make a follow up appointment Contact information: [address] [place] [address] [phone]        Raynelle Bring  Follow up.   Why:  Please make an appt to have you coumadin checked this week. Contact information: [address] [place] [address] [phone]          Discharge Instructions    Diet - low sodium heart healthy   Complete by:  As directed    Increase activity slowly   Complete by:  As directed      Discharge Medications     Medication List    STOP taking these medications   carvedilol 25 MG tablet Commonly known as:  COREG     TAKE these medications   amLODipine 2.5 MG tablet Commonly known as:  NORVASC Take 2.5 mg by mouth at bedtime.   doxazosin 4 MG tablet Commonly known as:  CARDURA Take 4 mg by mouth Nightly.   furosemide 80 MG tablet Commonly known as:  LASIX Take 80 mg by mouth daily. What changed:  Another medication with the same name was  removed. Continue taking this medication, and follow the directions you see here.   glimepiride 2 MG tablet Commonly known as:  AMARYL Take 4 mg by mouth daily.   lisinopril 40 MG tablet Commonly known as:  PRINIVIL,ZESTRIL Take 1 tablet (40 mg total) by mouth daily. Start taking on:  March 27, 2018 What changed:    medication strength  how much to take   metFORMIN 1000 MG tablet Commonly known as:  GLUCOPHAGE Take 1,000 mg by mouth 2 (two) times daily.   PIOGLITAZONE HCL PO Take 1 tablet by mouth daily.   Potassium 99 MG Tabs Take 1.5 tablets by mouth daily.   simvastatin 20 MG tablet Commonly known as:  ZOCOR Take 20 mg by mouth Nightly.   vitamin B-12 1000 MCG tablet Commonly known as:  CYANOCOBALAMIN Take 1,000 mcg by mouth daily.   warfarin 2 MG tablet Commonly known as:  COUMADIN Take 2 mg by mouth See admin instructions. Take 1 tablet by mouth  Monday-Friday  Take 1/2 tablet by mouth Saturday and Sunday.        Acute coronary syndrome (MI, NSTEMI, STEMI, etc) this admission?: No.     Outstanding Labs/Studies   Wound check follow up appt.   Duration of Discharge Encounter   Greater than 30 minutes including physician time.  Janice Coffin NP-C 03/26/2018, 12:04 PM  EP Attending  Patient seen and examined. Agree with above. The patient is doing well after PPM insertion. He is stable for DC home. Usual followup.  Leonia Reeves.D.

## 2018-03-26 NOTE — Progress Notes (Signed)
Patient resting comfortably during shift report. Denies complaints. Wife at bedside.   

## 2018-03-30 DIAGNOSIS — I4892 Unspecified atrial flutter: Secondary | ICD-10-CM | POA: Diagnosis not present

## 2018-03-30 DIAGNOSIS — I4891 Unspecified atrial fibrillation: Secondary | ICD-10-CM | POA: Diagnosis not present

## 2018-04-01 ENCOUNTER — Ambulatory Visit: Payer: Medicare HMO | Attending: Family | Admitting: Family

## 2018-04-01 ENCOUNTER — Encounter: Payer: Self-pay | Admitting: Pharmacist

## 2018-04-01 ENCOUNTER — Encounter: Payer: Self-pay | Admitting: Family

## 2018-04-01 DIAGNOSIS — I442 Atrioventricular block, complete: Secondary | ICD-10-CM | POA: Diagnosis not present

## 2018-04-01 DIAGNOSIS — Z79899 Other long term (current) drug therapy: Secondary | ICD-10-CM | POA: Diagnosis not present

## 2018-04-01 DIAGNOSIS — Z7984 Long term (current) use of oral hypoglycemic drugs: Secondary | ICD-10-CM | POA: Diagnosis not present

## 2018-04-01 DIAGNOSIS — E782 Mixed hyperlipidemia: Secondary | ICD-10-CM | POA: Insufficient documentation

## 2018-04-01 DIAGNOSIS — Z950 Presence of cardiac pacemaker: Secondary | ICD-10-CM | POA: Diagnosis not present

## 2018-04-01 DIAGNOSIS — I480 Paroxysmal atrial fibrillation: Secondary | ICD-10-CM

## 2018-04-01 DIAGNOSIS — Z87891 Personal history of nicotine dependence: Secondary | ICD-10-CM | POA: Diagnosis not present

## 2018-04-01 DIAGNOSIS — Z7901 Long term (current) use of anticoagulants: Secondary | ICD-10-CM | POA: Insufficient documentation

## 2018-04-01 DIAGNOSIS — E119 Type 2 diabetes mellitus without complications: Secondary | ICD-10-CM | POA: Insufficient documentation

## 2018-04-01 DIAGNOSIS — I5032 Chronic diastolic (congestive) heart failure: Secondary | ICD-10-CM | POA: Diagnosis not present

## 2018-04-01 DIAGNOSIS — I4821 Permanent atrial fibrillation: Secondary | ICD-10-CM | POA: Diagnosis not present

## 2018-04-01 DIAGNOSIS — I110 Hypertensive heart disease with heart failure: Secondary | ICD-10-CM | POA: Diagnosis not present

## 2018-04-01 DIAGNOSIS — I10 Essential (primary) hypertension: Secondary | ICD-10-CM | POA: Insufficient documentation

## 2018-04-01 MED ORDER — AMLODIPINE BESYLATE 5 MG PO TABS: 5.0000 mg | ORAL_TABLET | Freq: Every day | ORAL | 3 refills | Status: DC

## 2018-04-01 NOTE — Patient Instructions (Addendum)
Begin weighing daily and call for an overnight weight gain of > 2 pounds or a weekly weight gain of >5 pounds.  Check the website [employer]

## 2018-04-01 NOTE — Progress Notes (Signed)
[facility] - HEART FAILURE CLINIC - PHARMACIST COUNSELING NOTE  ADHERENCE ASSESSMENT  Adherence strategy: pill box   Do you ever forget to take your medication? [] Yes (1) [x] No (0)  Do you ever skip doses due to side effects? [] Yes (1) [x] No (0)  Do you have trouble affording your medicines? [] Yes (1) [x] No (0)  Are you ever unable to pick up your medication due to transportation difficulties? [] Yes (1) [x] No (0)  Do you ever stop taking your medications because you don't believe they are helping? [] Yes (1) [x] No (0)  Total score _0______    Recommendations given to patient about increasing adherence: Reports good adherence to medications. Wife is with him and she says she made him a chart where he checks off when he took his medication.  Guideline-Directed Medical Therapy/Evidence Based Medicine    ACE/ARB/ARNI: lisinopril 40 mg daily   Beta Blocker: none (pacemaker)   Aldosterone Antagonist: none (HFpEF) Diuretic: furosemide 80 mg daily    SUBJECTIVE   HPI: Recently discharged from the hospital s/p pacemaker implantation. At admission, his HR was in the 30s. He is here for initial visit with HF clinic.  Past Medical History:  Diagnosis Date  . CHF (congestive heart failure) (HCC)   . Diabetes mellitus without complication (HCC)   . Essential hypertension   . Mixed hyperlipidemia   . Permanent atrial fibrillation    a. s/p catheter ablation ~ 12 yrs ago @ [facility] per family->unsuccessful; b. CHA2DS2VASc = 5-->chronic coumadin.        OBJECTIVE    Vital signs: HR 58, BP 154/74, weight (pounds) 173.6  ECHO: Date 03/25/18, EF 50-55%, notes: mild/moderate MR   BMP Latest Ref Rng & Units 03/26/2018 03/25/2018 03/23/2018  Glucose 70 - 99 mg/dL 235(T) 614(E) 315(Q)  BUN 8 - 23 mg/dL 18 19 00(Q)  Creatinine 0.61 - 1.24 mg/dL 6.76 1.95 0.93  Sodium 135 - 145 mmol/L 145 143 140  Potassium 3.5 - 5.1 mmol/L 3.8 3.1(L) 4.1  Chloride 98 - 111 mmol/L 105  106 105  CO2 22 - 32 mmol/L 33(H) 31 25  Calcium 8.9 - 10.3 mg/dL 2.6(Z) 1.2(W) 9.2    ASSESSMENT 83 year old male with HFpEF s/p pacemaker implantation. Patient and wife report he is doing much better since leaving the hospital and getting pacemaker. He says he is able to walk more and is no longer short of breath. Previously, he could not hold a conversation without getting tired. His BP medications were initially reduced in hospital for low HR and lisinopril subsequently increased for continued high BP after pacemaker implantation. BP today still elevated.   PLAN Increase amlodipine to 5 mg daily and follow up BP at next visit. Patient encouraged to weigh daily and report abnormal weight gain.    Time spent: 10 minutes  Abby K Ellington, Pharm.D. 04/01/2018 12:09 PM    Current Outpatient Medications:  .  amLODipine (NORVASC) 2.5 MG tablet, Take 2.5 mg by mouth at bedtime., Disp: , Rfl:  .  doxazosin (CARDURA) 4 MG tablet, Take 4 mg by mouth Nightly., Disp: , Rfl:  .  furosemide (LASIX) 80 MG tablet, Take 80 mg by mouth daily., Disp: , Rfl:  .  glimepiride (AMARYL) 2 MG tablet, Take 4 mg by mouth daily., Disp: , Rfl:  .  lisinopril (PRINIVIL,ZESTRIL) 40 MG tablet, Take 1 tablet (40 mg total) by mouth daily., Disp: 30 tablet, Rfl: 1 .  metFORMIN (GLUCOPHAGE) 1000 MG tablet, Take 1,000 mg  by mouth 2 (two) times daily., Disp: , Rfl:  .  PIOGLITAZONE HCL PO, Take 15 mg by mouth daily. , Disp: , Rfl:  .  Potassium 99 MG TABS, Take 1.5 tablets by mouth daily., Disp: , Rfl:  .  simvastatin (ZOCOR) 20 MG tablet, Take 20 mg by mouth Nightly., Disp: , Rfl:  .  vitamin B-12 (CYANOCOBALAMIN) 1000 MCG tablet, Take 1,000 mcg by mouth daily., Disp: , Rfl:  .  warfarin (COUMADIN) 2 MG tablet, Take 2 mg by mouth See admin instructions. Take 1 tablet by mouth  Monday-Friday  Take 1/2 tablet by mouth Saturday and Sunday., Disp: , Rfl:    COUNSELING POINTS/CLINICAL PEARLS  Lisinopril (Goal: 20 to 40  mg once daily)  This drug may cause nausea, vomiting, dizziness, headache, or angioedema of face, lips, throat, or intestines.  Instruct patient to report signs/symptoms of hypotension, or a persistent cough.  Advise patient against sudden discontinuation of drug. Furosemide  Drug causes sun-sensitivity. Advise patient to use sunscreen and avoid tanning beds. Patient should avoid activities requiring coordination until drug effects are realized, as drug may cause dizziness, vertigo, or blurred vision. This drug may cause hyperglycemia, hyperuricemia, constipation, diarrhea, loss of appetite, nausea, vomiting, purpuric disorder, cramps, spasticity, asthenia, headache, paresthesia, or scaling eczema. Instruct patient to report unusual bleeding/bruising or signs/symptoms of hypotension, infection, pancreatitis, or ototoxicity (tinnitus, hearing impairment). Advise patient to report signs/symptoms of a severe skin reactions (flu-like symptoms, spreading red rash, or skin/mucous membrane blistering) or erythema multiforme. Instruct patient to eat high-potassium foods during drug therapy, as directed by healthcare professional.  Patient should not drink alcohol while taking this drug.   DRUGS TO AVOID IN HEART FAILURE  Drug or Class Mechanism  Analgesics . NSAIDs . COX-2 inhibitors . Glucocorticoids  Sodium and water retention, increased systemic vascular resistance, decreased response to diuretics   Diabetes Medications . Metformin . Thiazolidinediones o Rosiglitazone (Avandia) o Pioglitazone (Actos) . DPP4 Inhibitors o Saxagliptin (Onglyza) o Sitagliptin (Januvia)   Lactic acidosis Possible calcium channel blockade   Unknown  Antiarrhythmics . Class I  o Flecainide o Disopyramide . Class III o Sotalol . Other o Dronedarone  Negative inotrope, proarrhythmic   Proarrhythmic, beta blockade  Negative inotrope  Antihypertensives . Alpha Blockers o Doxazosin . Calcium  Channel Blockers o Diltiazem o Verapamil o Nifedipine . Central Alpha Adrenergics o Moxonidine . Peripheral Vasodilators o Minoxidil  Increases renin and aldosterone  Negative inotrope    Possible sympathetic withdrawal  Unknown  Anti-infective . Itraconazole . Amphotericin B  Negative inotrope Unknown  Hematologic . Anagrelide . Cilostazol   Possible inhibition of PD IV Inhibition of PD III causing arrhythmias  Neurologic/Psychiatric . Stimulants . Anti-Seizure Drugs o Carbamazepine o Pregabalin . Antidepressants o Tricyclics o Citalopram . Parkinsons o Bromocriptine o Pergolide o Pramipexole . Antipsychotics o Clozapine . Antimigraine o Ergotamine o Methysergide . Appetite suppressants . Bipolar o Lithium  Peripheral alpha and beta agonist activity  Negative inotrope and chronotrope Calcium channel blockade  Negative inotrope, proarrhythmic Dose-dependent QT prolongation  Excessive serotonin activity/valvular damage Excessive serotonin activity/valvular damage Unknown  IgE mediated hypersensitivy, calcium channel blockade  Excessive serotonin activity/valvular damage Excessive serotonin activity/valvular damage Valvular damage  Direct myofibrillar degeneration, adrenergic stimulation  Antimalarials . Chloroquine . Hydroxychloroquine Intracellular inhibition of lysosomal enzymes  Urologic Agents . Alpha Blockers o Doxazosin o Prazosin o Tamsulosin o Terazosin  Increased renin and aldosterone  Adapted from Page Carleene Overlie, et al. "Drugs That May Cause or Exacerbate  Heart Failure: A Scientific Statement from the [employer]." Circulation 2016; 161:W96-E45. DOI: 10.1161/CIR.0000000000000426   MEDICATION ADHERENCES TIPS AND STRATEGIES 1. Taking medication as prescribed improves patient outcomes in heart failure (reduces hospitalizations, improves symptoms, increases survival) 2. Side effects of medications can be managed by  decreasing doses, switching agents, stopping drugs, or adding additional therapy. Please let someone in the [facility] know if you have having bothersome side effects so we can modify your regimen. Do not alter your medication regimen without talking to [place].  3. Medication reminders can help patients remember to take drugs on time. If you are missing or forgetting doses you can try linking behaviors, using pill boxes, or an electronic reminder like an alarm on your phone or an app. Some people can also get automated phone calls as medication reminders.

## 2018-04-01 NOTE — Progress Notes (Signed)
Patient ID: Andrew [name], male    DOB: 1935-06-02, 83 y.o.   MRN: [id number]  HPI  Andrew [name] is a 83 y/o male with a history of DM, hyperlipidemia, HTN, atrial fibrillation, previous tobacco use and chronic heart failure.   Echo report from 03/25/2018 reviewed and showed an EF of 50-55% along with mild/moderate Andrew.   Admitted 03/23/2018 due to complete heart block along with HF exacerbation. Cardiology consult obtained. Initially given IV lasix and then transitioned to oral diuretics. Transferred after 2 days to [facility] for pacemaker implantation. Discharged from there the following day.    He presents today for his initial visit with a chief complaint of moderate fatigue upon minimal exertion. He describes this as having been present for several weeks. He does feel like his energy level is improving. He has no other symptoms and denies any difficulty sleeping, abdominal distention, palpitations, dizziness, chest pain, shortness of breath or weight gain.   Past Medical History:  Diagnosis Date  . CHF (congestive heart failure) (HCC)   . Diabetes mellitus without complication (HCC)   . Essential hypertension   . Mixed hyperlipidemia   . Permanent atrial fibrillation    a. s/p catheter ablation ~ 12 yrs ago @ [facility] per family->unsuccessful; b. CHA2DS2VASc = 5-->chronic coumadin.   Past Surgical History:  Procedure Laterality Date  . APPENDECTOMY    . INSERT / REPLACE / REMOVE PACEMAKER    . PACEMAKER IMPLANT N/A 03/25/2018   Procedure: PACEMAKER IMPLANT;  Surgeon: Hillis Range, MD;  Location: [facility] CV [facility];  Service: Cardiovascular;  Laterality: N/A;   Family History  Problem Relation Age of Onset  . CVA Mother   . Bladder Cancer Father    Social History   Tobacco Use  . Smoking status: Former Smoker    Packs/day: 1.50    Years: 35.00    Pack years: 52.50  . Smokeless tobacco: Never Used  Substance Use Topics  . Alcohol use: Yes    Comment: 1.5 beers a month    Allergies  Allergen Reactions  . Methyldopa Nausea Only and Palpitations    Other reaction(s): Unknown Other reaction(s): NAUSEA 'palpitation" Other reaction(s): NAUSEA Other reaction(s): NAUSEA    Prior to Admission medications   Medication Sig Start Date End Date Taking? Authorizing Provider  amLODipine (NORVASC) 2.5 MG tablet Take 2.5 mg by mouth at bedtime.   Yes [provider]  doxazosin (CARDURA) 4 MG tablet Take 4 mg by mouth Nightly. 12/31/16  Yes [provider]  furosemide (LASIX) 80 MG tablet Take 80 mg by mouth daily.   Yes [provider]  glimepiride (AMARYL) 2 MG tablet Take 4 mg by mouth daily. 08/14/16  Yes [provider]  lisinopril (PRINIVIL,ZESTRIL) 40 MG tablet Take 1 tablet (40 mg total) by mouth daily. 03/27/18  Yes Laverda Page B, NP  metFORMIN (GLUCOPHAGE) 1000 MG tablet Take 1,000 mg by mouth 2 (two) times daily.   Yes [provider]  PIOGLITAZONE HCL PO Take 15 mg by mouth daily.    Yes [provider]  Potassium 99 MG TABS Take 1.5 tablets by mouth daily.   Yes [provider]  simvastatin (ZOCOR) 20 MG tablet Take 20 mg by mouth Nightly. 12/31/16  Yes [provider]  vitamin B-12 (CYANOCOBALAMIN) 1000 MCG tablet Take 1,000 mcg by mouth daily.   Yes [provider]  warfarin (COUMADIN) 2 MG tablet Take 2 mg by mouth See admin instructions. Take  1 tablet by mouth  Monday-Friday  Take 1/2 tablet by mouth Saturday and Sunday.   Yes [provider]    Review of Systems  Constitutional: Positive for fatigue (with minimal exertion). Negative for appetite change.  HENT: Negative for congestion, rhinorrhea and sore throat.   Respiratory: Negative for chest tightness and shortness of breath.   Cardiovascular: Positive for leg swelling. Negative for chest pain and palpitations.  Gastrointestinal: Negative for abdominal distention and abdominal pain.  Genitourinary:  Negative.   Musculoskeletal: Negative for back pain and neck pain.  Skin: Negative.   Neurological: Negative for dizziness and light-headedness.  Hematological: Negative for adenopathy. Does not bruise/bleed easily.  Psychiatric/Behavioral: Negative for dysphoric mood and sleep disturbance (sleeping on 1 pillow). The patient is not nervous/anxious.    Vitals:   04/01/18 1128  BP: (!) 154/74  Pulse: (!) 58  Resp: 18  SpO2: 99%  Weight: 173 lb 6 oz (78.6 kg)  Height: 5\' 10"  (1.778 m)   Wt Readings from Last 3 Encounters:  04/01/18 173 lb 6 oz (78.6 kg)  03/26/18 179 lb 11.2 oz (81.5 kg)  03/25/18 182 lb 5.1 oz (82.7 kg)   Lab Results  Component Value Date   CREATININE 0.82 03/26/2018   CREATININE 0.74 03/25/2018   CREATININE 0.99 03/23/2018    Physical Exam Vitals signs and nursing note reviewed.  Constitutional:      Appearance: Normal appearance.  HENT:     Head: Normocephalic and atraumatic.  Neck:     Musculoskeletal: Normal range of motion and neck supple.  Cardiovascular:     Rate and Rhythm: Regular rhythm. Bradycardia present.  Pulmonary:     Effort: Pulmonary effort is normal. No respiratory distress.     Breath sounds: No wheezing or rales.  Abdominal:     General: There is no distension.     Palpations: Abdomen is soft.  Musculoskeletal:        General: No tenderness.     Right lower leg: No edema.     Left lower leg: No edema.  Neurological:     General: No focal deficit present.     Mental Status: He is alert and oriented to person, place, and time.  Psychiatric:        Mood and Affect: Mood normal.        Behavior: Behavior normal.        Thought Content: Thought content normal.    Assessment & Plan:  1: Chronic heart failure with preserved ejection fraction- - NYHA class III - euvolemic today - not weighing daily; instructed to weigh daily and to call for an overnight weight gain of >2 pounds or a weekly weight gain of >5 pounds - not adding  salt and has been reading food labels; reviewed the importance of closely following a 2000mg  sodium diet and written dietary information was given to him about this.  - wearing supports socks daily - does elevate his legs when sitting for long periods of time - BNP 03/23/2018 was 314.0 - PharmD reconciled medications with the patient - received his flu vaccine for this season  2: HTN- - BP mildly elevated today; will increase his amlodipine to 5mg  daily; advised his wife that she could finish up his current dose by taking 2 tablets until gone - saw PCP Andrew [name]) 03/23/2018 - BMP from 03/26/2018 reviewed and showed sodium 145, potassium 3.8, creatinine .082 and GFR >60  3: DM- - glucose this morning was 165 - A1c  01/03/18 was 8.7%  4: Atrial fibrillation- - PPM placed 03/25/2018 - ablation done ~ 10-12 years ago  Medication list was reviewed.   Return in 1 month or sooner for any questions/problems before then.

## 2018-04-05 ENCOUNTER — Ambulatory Visit: Payer: Medicare HMO

## 2018-04-07 ENCOUNTER — Ambulatory Visit (INDEPENDENT_AMBULATORY_CARE_PROVIDER_SITE_OTHER): Payer: Medicare HMO | Admitting: Nurse Practitioner

## 2018-04-07 DIAGNOSIS — R001 Bradycardia, unspecified: Secondary | ICD-10-CM

## 2018-04-07 LAB — CUP PACEART INCLINIC DEVICE CHECK
Date Time Interrogation Session: 20200305132852
Implantable Lead Implant Date: 20200221
Implantable Lead Location: 753860
MDC IDC PG IMPLANT DT: 20200221
Pulse Gen Model: 1272
Pulse Gen Serial Number: 9114203

## 2018-04-07 NOTE — Progress Notes (Signed)
Wound check appointment. Steri-strips removed. Wound without redness or edema. Incision edges approximated, wound well healed. Normal device function. Thresholds, sensing, and impedances consistent with implant measurements. Device programmed at 3.5V/auto capture programmed on for extra safety margin until 3 month visit. Histogram distribution appropriate for patient and level of activity. No high ventricular rates noted. Patient educated about wound care, arm mobility, lifting restrictions. ROV in 3 months with implanting physician. 

## 2018-04-13 DIAGNOSIS — E1165 Type 2 diabetes mellitus with hyperglycemia: Secondary | ICD-10-CM | POA: Diagnosis not present

## 2018-04-13 DIAGNOSIS — I4891 Unspecified atrial fibrillation: Secondary | ICD-10-CM | POA: Diagnosis not present

## 2018-04-13 DIAGNOSIS — R413 Other amnesia: Secondary | ICD-10-CM | POA: Diagnosis not present

## 2018-04-13 DIAGNOSIS — I4892 Unspecified atrial flutter: Secondary | ICD-10-CM | POA: Diagnosis not present

## 2018-04-13 DIAGNOSIS — Y92009 Unspecified place in unspecified non-institutional (private) residence as the place of occurrence of the external cause: Secondary | ICD-10-CM | POA: Diagnosis not present

## 2018-04-13 DIAGNOSIS — W010XXA Fall on same level from slipping, tripping and stumbling without subsequent striking against object, initial encounter: Secondary | ICD-10-CM | POA: Diagnosis not present

## 2018-04-13 DIAGNOSIS — I10 Essential (primary) hypertension: Secondary | ICD-10-CM | POA: Diagnosis not present

## 2018-04-13 DIAGNOSIS — S3091XA Unspecified superficial injury of lower back and pelvis, initial encounter: Secondary | ICD-10-CM | POA: Diagnosis not present

## 2018-04-26 DIAGNOSIS — I4892 Unspecified atrial flutter: Secondary | ICD-10-CM | POA: Diagnosis not present

## 2018-04-26 DIAGNOSIS — I4891 Unspecified atrial fibrillation: Secondary | ICD-10-CM | POA: Diagnosis not present

## 2018-05-02 ENCOUNTER — Ambulatory Visit: Payer: Medicare HMO | Admitting: Family

## 2018-05-30 DIAGNOSIS — I4891 Unspecified atrial fibrillation: Secondary | ICD-10-CM | POA: Diagnosis not present

## 2018-05-30 DIAGNOSIS — Z0000 Encounter for general adult medical examination without abnormal findings: Secondary | ICD-10-CM | POA: Diagnosis not present

## 2018-05-30 DIAGNOSIS — I4892 Unspecified atrial flutter: Secondary | ICD-10-CM | POA: Diagnosis not present

## 2018-05-30 DIAGNOSIS — E119 Type 2 diabetes mellitus without complications: Secondary | ICD-10-CM | POA: Diagnosis not present

## 2018-05-30 DIAGNOSIS — I10 Essential (primary) hypertension: Secondary | ICD-10-CM | POA: Diagnosis not present

## 2018-05-30 DIAGNOSIS — E782 Mixed hyperlipidemia: Secondary | ICD-10-CM | POA: Diagnosis not present

## 2018-06-08 ENCOUNTER — Telehealth: Payer: Self-pay

## 2018-06-08 NOTE — Telephone Encounter (Signed)
   TELEPHONE CALL NOTE  This patient has been deemed a candidate for follow-up tele-health visit to limit community exposure during the Covid-19 pandemic. I spoke with the patient via phone to discuss instructions. The patient was advised to review the section on consent for treatment as well. The patient will receive a phone call 2-3 days prior to their E-Visit at which time consent will be verbally confirmed. A Virtual Office Visit appointment type has been scheduled for 06/10/2018 with [facility].  ALLRED, AMANDA L, CMA 06/08/2018 1:22 PM

## 2018-06-08 NOTE — Telephone Encounter (Signed)
TELEPHONE CALL NOTE  Andrew [name] has been deemed a candidate for a follow-up tele-health visit to limit community exposure during the Covid-19 pandemic. I spoke with the patient via phone to ensure availability of phone/video source, confirm preferred email & phone number, discuss instructions and expectations, and review consent.   I reminded Andrew [name] to be prepared with any vital sign and/or heart rhythm information that could potentially be obtained via home monitoring, at the time of his visit.  Finally, I reminded Andrew [name] to expect an e-mail containing a link for their video-based visit approximately 15 minutes before his visit, or alternatively, a phone call at the time of his visit if his visit is planned to be a phone encounter.  Did the patient verbally consent to treatment as below? Yes  ,  L, CMA 06/08/2018 1:23 PM  CONSENT FOR TELE-HEALTH VISIT - PLEASE REVIEW  I hereby voluntarily request, consent and authorize [facility] and its employed or contracted physicians, physician assistants, nurse practitioners or other licensed health care professionals (the Practitioner), to provide me with telemedicine health care services (the "Services") as deemed necessary by the treating Practitioner. I acknowledge and consent to receive the Services by the Practitioner via telemedicine. I understand that the telemedicine visit will involve communicating with the Practitioner through telephonic communication technology and the disclosure of certain medical information by electronic transmission. I acknowledge that I have been given the opportunity to request an in-person assessment or other available alternative prior to the telemedicine visit and am voluntarily participating in the telemedicine visit.  I understand that I have the right to withhold or withdraw my consent to the use of telemedicine in the course of my care at any time, without affecting my  right to future care or treatment, and that the Practitioner or I may terminate the telemedicine visit at any time. I understand that I have the right to inspect all information obtained and/or recorded in the course of the telemedicine visit and may receive copies of available information for a reasonable fee.  I understand that some of the potential risks of receiving the Services via telemedicine include:  Marland Kitchen Delay or interruption in medical evaluation due to technological equipment failure or disruption; . Information transmitted may not be sufficient (e.g. poor resolution of images) to allow for appropriate medical decision making by the Practitioner; and/or  . In rare instances, security protocols could fail, causing a breach of personal health information.  Furthermore, I acknowledge that it is my responsibility to provide information about my medical history, conditions and care that is complete and accurate to the best of my ability. I acknowledge that Practitioner's advice, recommendations, and/or decision may be based on factors not within their control, such as incomplete or inaccurate data provided by me or lack of visual representation. I understand that the practice of medicine is not an exact science and that Practitioner makes no warranties or guarantees regarding treatment outcomes. I acknowledge that I will receive a copy of this consent concurrently upon execution via email to the email address I last provided but may also request a printed copy by calling the office of [facility].    I understand that my insurance may be billed for this visit.   I have read or had this consent read to me. . I understand the contents of this consent, which adequately explains the benefits and risks of the Services being provided via telemedicine.  Marland Kitchen  I have been provided ample opportunity to ask questions regarding this consent and the Services and have had my questions answered to my  satisfaction. . I give my informed consent for the services to be provided through the use of telemedicine in my medical care  By participating in this telemedicine visit I agree to the above.

## 2018-06-10 ENCOUNTER — Ambulatory Visit: Payer: Medicare HMO | Attending: Family | Admitting: Family

## 2018-06-10 ENCOUNTER — Other Ambulatory Visit: Payer: Self-pay

## 2018-06-10 ENCOUNTER — Encounter: Payer: Self-pay | Admitting: Family

## 2018-06-10 VITALS — Wt 173.5 lb

## 2018-06-10 DIAGNOSIS — I10 Essential (primary) hypertension: Secondary | ICD-10-CM

## 2018-06-10 DIAGNOSIS — I5032 Chronic diastolic (congestive) heart failure: Secondary | ICD-10-CM

## 2018-06-10 DIAGNOSIS — I480 Paroxysmal atrial fibrillation: Secondary | ICD-10-CM

## 2018-06-10 DIAGNOSIS — E119 Type 2 diabetes mellitus without complications: Secondary | ICD-10-CM

## 2018-06-10 NOTE — Patient Instructions (Signed)
Continue weighing daily and call for an overnight weight gain of > 2 pounds or a weekly weight gain of >5 pounds. 

## 2018-06-10 NOTE — Progress Notes (Signed)
Virtual Visit via Telephone Note    Evaluation Performed:  Follow-up visit  This visit type was conducted due to national recommendations for restrictions regarding the COVID-19 Pandemic (e.g. social distancing).  This format is felt to be most appropriate for this patient at this time.  All issues noted in this document were discussed and addressed.  No physical exam was performed (except for noted visual exam findings with Video Visits).  Please refer to the patient's chart (MyChart message for video visits and phone note for telephone visits) for the patient's consent to telehealth for [facility]  Date:  06/10/2018   ID:  Andrew [name], DOB Oct 23, 1935, MRN [id number]  Patient Location:  [address] [place] [address]   Provider location:   [facility] [address] [place], [place] [address]  PCP:  Gracelyn Nurse, MD  Cardiologist:  Yvonne Kendall, MD  Electrophysiologist:  None   Chief Complaint:  Shortness of breath  History of Present Illness:    Andrew [name] is a 83 y.o. male who presents via audio/video conferencing for a telehealth visit today.  Patient verified DOB and address.  The patient does not have  symptoms concerning for COVID-19 infection (fever, chills, cough, or new SHORTNESS OF BREATH).   Patient reports minimal shortness of breath upon moderate exertion. He says that this has been chronic in nature having been present for several months. He does feel like his breathing is improving though. He has associated dizziness with sudden position changes and slight fatigue along with this. He denies any swelling in his legs/ abdomen, palpitations, chest pain, difficulty sleeping, cough or weight gain. Overall, he says that he feels "great".   Prior CV studies:   The following studies were reviewed today:  Echo report from 03/25/2018 reviewed and showed an EF of 50-55% along with mild/ moderate MR.  Past Medical  History:  Diagnosis Date  . CHF (congestive heart failure) (HCC)   . Diabetes mellitus without complication (HCC)   . Essential hypertension   . Mixed hyperlipidemia   . Permanent atrial fibrillation    a. s/p catheter ablation ~ 12 yrs ago @ [facility] per family->unsuccessful; b. CHA2DS2VASc = 5-->chronic coumadin.   Past Surgical History:  Procedure Laterality Date  . APPENDECTOMY    . INSERT / REPLACE / REMOVE PACEMAKER    . PACEMAKER IMPLANT N/A 03/25/2018   Procedure: PACEMAKER IMPLANT;  Surgeon: Hillis Range, MD;  Location: [facility] INVAS[facility] CV LAB;  Service: Cardiovascular;  Laterality: N/A;     Current Meds  Medication Sig  . amLODipine (NORVASC) 5 MG tablet Take 1 tablet (5 mg total) by mouth daily.  Marland Kitchen doxazosin (CARDURA) 4 MG tablet Take 4 mg by mouth Nightly.  . furosemide (LASIX) 80 MG tablet Take 80 mg by mouth daily.  Marland Kitchen glimepiride (AMARYL) 2 MG tablet Take 4 mg by mouth daily.  Marland Kitchen lisinopril (PRINIVIL,ZESTRIL) 40 MG tablet Take 1 tablet (40 mg total) by mouth daily.  . metFORMIN (GLUCOPHAGE) 1000 MG tablet Take 1,000 mg by mouth 2 (two) times daily.  Marland Kitchen PIOGLITAZONE HCL PO Take 15 mg by mouth daily.   . Potassium 99 MG TABS Take 1.5 tablets by mouth daily.  . simvastatin (ZOCOR) 20 MG tablet Take 20 mg by mouth Nightly.  . vitamin B-12 (CYANOCOBALAMIN) 1000 MCG tablet Take 1,000 mcg by mouth daily.  Marland Kitchen warfarin (COUMADIN) 2 MG tablet Take 2 mg by mouth See admin  instructions. Take 1 tablet by mouth  Monday-Friday  Take 1/2 tablet by mouth Saturday and Sunday.     Allergies:   Methyldopa   Social History   Tobacco Use  . Smoking status: Former Smoker    Packs/day: 1.50    Years: 35.00    Pack years: 52.50  . Smokeless tobacco: Never Used  Substance Use Topics  . Alcohol use: Yes    Comment: 1.5 beers a month  . Drug use: Never     Family Hx: The patient's family history includes Bladder Cancer in his father; CVA in his mother.  ROS:   Please see the history of  present illness.     All other systems reviewed and are negative.   Labs/Other Tests and Data Reviewed:    Recent Labs: 03/23/2018: B Natriuretic Peptide 314.0; TSH 2.249 03/25/2018: Hemoglobin 10.9; Platelets 84 03/26/2018: BUN 18; Creatinine, Ser 0.82; Potassium 3.8; Sodium 145   Recent Lipid Panel No results found for: CHOL, TRIG, HDL, CHOLHDL, LDLCALC, LDLDIRECT  Wt Readings from Last 3 Encounters:  06/10/18 173 lb 8 oz (78.7 kg)  04/01/18 173 lb 6 oz (78.6 kg)  03/26/18 179 lb 11.2 oz (81.5 kg)     Exam:    Vital Signs:  Wt 173 lb 8 oz (78.7 kg) Comment: self-reported  BMI 24.89 kg/m    Well nourished, well developed male in no  acute distress.   ASSESSMENT & PLAN:    1.Chronic heart failure with preserved ejection fraction- - NYHA class II - euvolemic today based on patient's description of symptoms - weighing daily; reminded to call for an overnight weight gain of >2 pounds or a weekly weight gain of >5 pounds - not adding salt and has been reading food labels; reminded to closely follow a 2000mg  sodium diet  - wearing supports socks daily - does elevate his legs when sitting for long periods of time - BNP 03/23/2018 was 314.0  2: HTN- - does not check his BP at home - saw PCP Letitia Libra) 05/30/2018 - BMP from 04/13/2018 reviewed and showed sodium 144, potassium 4.1, creatinine 0.9 and GFR 81  3: DM- - glucose ranges between 110-120's - A1c 04/13/2018 was 8.4%  4: Atrial fibrillation- - PPM placed 03/25/2018 - ablation done ~ 10-12 years ago    COVID-19 Education: The signs and symptoms of COVID-19 were discussed with the patient and how to seek care for testing (follow up with PCP or arrange E-visit).  The importance of social distancing was discussed today.  Patient Risk:   After full review of this patients clinical status, I feel that they are at least moderate risk at this time.  Time:   Today, I have spent 8 minutes with the patient with telehealth  technology discussing medications, weight and symptoms to report.     Medication Adjustments/Labs and Tests Ordered: Current medicines are reviewed at length with the patient today.  Concerns regarding medicines are outlined above.   Tests Ordered: No orders of the defined types were placed in this encounter.  Medication Changes: No orders of the defined types were placed in this encounter.   Disposition:  Follow-up in 2 months or sooner for any questions/problems before then.   Signed, Delma Freeze, FNP  06/10/2018 10:04 AM    [facility]

## 2018-06-21 ENCOUNTER — Telehealth: Payer: Self-pay

## 2018-06-21 ENCOUNTER — Telehealth: Payer: Self-pay | Admitting: Internal Medicine

## 2018-06-21 NOTE — Telephone Encounter (Signed)
Spoke with pts wife regarding appt on 06/24/18. Pts wife stated she will setup pts MyChart and call back if she has issues.

## 2018-06-21 NOTE — Telephone Encounter (Signed)
New message   Patient's wife states that her or the patient is not able to set up the video visit for 06/24/18. Please call to discuss.

## 2018-06-21 NOTE — Telephone Encounter (Signed)
New Message    Pts wife is calling and would like to do a Telephone visit instead because they cannot access the virtual    Please call

## 2018-06-24 ENCOUNTER — Telehealth: Payer: Medicare HMO | Admitting: Internal Medicine

## 2018-06-29 DIAGNOSIS — I4891 Unspecified atrial fibrillation: Secondary | ICD-10-CM | POA: Diagnosis not present

## 2018-06-29 DIAGNOSIS — I4892 Unspecified atrial flutter: Secondary | ICD-10-CM | POA: Diagnosis not present

## 2018-07-01 ENCOUNTER — Other Ambulatory Visit: Payer: Self-pay

## 2018-07-01 ENCOUNTER — Encounter: Payer: Self-pay | Admitting: Nurse Practitioner

## 2018-07-01 ENCOUNTER — Telehealth (INDEPENDENT_AMBULATORY_CARE_PROVIDER_SITE_OTHER): Payer: Medicare HMO | Admitting: Nurse Practitioner

## 2018-07-01 ENCOUNTER — Ambulatory Visit (INDEPENDENT_AMBULATORY_CARE_PROVIDER_SITE_OTHER): Payer: Medicare HMO | Admitting: *Deleted

## 2018-07-01 VITALS — Ht 70.0 in | Wt 173.8 lb

## 2018-07-01 DIAGNOSIS — R001 Bradycardia, unspecified: Secondary | ICD-10-CM

## 2018-07-01 DIAGNOSIS — I5032 Chronic diastolic (congestive) heart failure: Secondary | ICD-10-CM

## 2018-07-01 DIAGNOSIS — I4821 Permanent atrial fibrillation: Secondary | ICD-10-CM

## 2018-07-01 NOTE — Progress Notes (Signed)
Electrophysiology TeleHealth Note   Due to national recommendations of social distancing due to COVID 19, an audio/video telehealth visit is felt to be most appropriate for this patient at this time.  See MyChart message from today for the patient's consent to telehealth for [facility].   Date:  07/01/2018   ID:  Andrew [name], DOB 06/07/1935, MRN [id number]  Location: patient's home  Provider location: [address], [place]  Evaluation Performed: Follow-up visit  PCP:  Gracelyn Nurse, MD  Cardiologist:  Yvonne Kendall, MD  Electrophysiologist:  Dr Johney Frame  Chief Complaint:  Pacemaker follow up  History of Present Illness:    Andrew [name] is a 83 y.o. male who presents via audio conferencing for a telehealth visit today.  Since last being seen in our clinic, the patient reports doing very well. He is significantly improved post pacemaker implantation.  Today, he denies symptoms of palpitations, chest pain, shortness of breath,  lower extremity edema, dizziness, presyncope, or syncope.  The patient is otherwise without complaint today.  The patient denies symptoms of fevers, chills, cough, or new SOB worrisome for COVID 19.  Past Medical History:  Diagnosis Date  . CHF (congestive heart failure) (HCC)   . Diabetes mellitus without complication (HCC)   . Essential hypertension   . Mixed hyperlipidemia   . Permanent atrial fibrillation    a. s/p catheter ablation ~ 12 yrs ago @ [facility] per family->unsuccessful; b. CHA2DS2VASc = 5-->chronic coumadin.    Past Surgical History:  Procedure Laterality Date  . APPENDECTOMY    . INSERT / REPLACE / REMOVE PACEMAKER    . PACEMAKER IMPLANT N/A 03/25/2018   Procedure: PACEMAKER IMPLANT;  Surgeon: Hillis Range, MD;  Location: [facility] INV[facility] CV LAB;  Service: Cardiovascular;  Laterality: N/A;    Current Outpatient Medications  Medication Sig Dispense Refill  . amLODipine (NORVASC) 5 MG tablet Take 1 tablet (5  mg total) by mouth daily. 90 tablet 3  . doxazosin (CARDURA) 4 MG tablet Take 4 mg by mouth Nightly.    . furosemide (LASIX) 80 MG tablet Take 80 mg by mouth daily.    Marland Kitchen glimepiride (AMARYL) 2 MG tablet Take 4 mg by mouth daily.    Marland Kitchen lisinopril (PRINIVIL,ZESTRIL) 40 MG tablet Take 1 tablet (40 mg total) by mouth daily. 30 tablet 1  . metFORMIN (GLUCOPHAGE) 1000 MG tablet Take 1,000 mg by mouth 2 (two) times daily.    Marland Kitchen PIOGLITAZONE HCL PO Take 15 mg by mouth daily.     . Potassium 99 MG TABS Take 1.5 tablets by mouth daily.    . simvastatin (ZOCOR) 20 MG tablet Take 20 mg by mouth Nightly.    . vitamin B-12 (CYANOCOBALAMIN) 1000 MCG tablet Take 1,000 mcg by mouth daily.    Marland Kitchen warfarin (COUMADIN) 2 MG tablet Take 2 mg by mouth See admin instructions. Take 1 tablet by mouth  Monday-Friday  Take 1/2 tablet by mouth Saturday and Sunday.     No current facility-administered medications for this visit.     Allergies:   Methyldopa   Social History:  The patient  reports that he has quit smoking. He has a 52.50 pack-year smoking history. He has never used smokeless tobacco. He reports current alcohol use. He reports that he does not use drugs.   Family History:  The patient's  family history includes Bladder Cancer in his father; CVA in his mother.   ROS:  Please see  the history of present illness.   All other systems are personally reviewed and negative.    Exam:    Vital Signs:  Ht 5\' 10"  (1.778 m)   Wt 173 lb 12.8 oz (78.8 kg)   BMI 24.94 kg/m   alert and conversant, well sounding  Labs/Other Tests and Data Reviewed:    Recent Labs: 03/23/2018: B Natriuretic Peptide 314.0; TSH 2.249 03/25/2018: Hemoglobin 10.9; Platelets 84 03/26/2018: BUN 18; Creatinine, Ser 0.82; Potassium 3.8; Sodium 145   Wt Readings from Last 3 Encounters:  07/01/18 173 lb 12.8 oz (78.8 kg)  06/10/18 173 lb 8 oz (78.7 kg)  04/01/18 173 lb 6 oz (78.6 kg)     Other studies personally reviewed: Additional  studies/ records that were reviewed today include: hospital records    ASSESSMENT & PLAN:    1.  Symptomatic bradycardia Significantly improved post pacemaker implantation Remote interrogation requested today Wound well healed per family Routine follow up - he prefers in office visits in [place]   2.  Permanent atrial fibrillation Continue Warfarin for CHADS2VASC of 5  3.  HTN Stable No change required today    COVID 19 screen The patient denies symptoms of COVID 19 at this time.  The importance of social distancing was discussed today.  Follow-up:  Remotes, Dr Graciela Husbands in [place] 1 year   Current medicines are reviewed at length with the patient today.   The patient does not have concerns regarding his medicines.  The following changes were made today:  none  Labs/ tests ordered today include: none No orders of the defined types were placed in this encounter.    Patient Risk:  after full review of this patients clinical status, I feel that they are at moderate risk at this time.  Today, I have spent 8 minutes with the patient with telehealth technology    Signed, Gypsy Balsam, NP  07/01/2018 10:32 AM     [facility] [address] [place] [address] [phone] (office) [phone] (fax)

## 2018-07-02 LAB — CUP PACEART REMOTE DEVICE CHECK
Date Time Interrogation Session: 20200530083019
Implantable Lead Implant Date: 20200221
Implantable Lead Location: 753860
Implantable Pulse Generator Implant Date: 20200221
Pulse Gen Model: 1272
Pulse Gen Serial Number: 9114203

## 2018-07-06 ENCOUNTER — Encounter: Payer: Self-pay | Admitting: Cardiology

## 2018-07-06 DIAGNOSIS — I4891 Unspecified atrial fibrillation: Secondary | ICD-10-CM | POA: Diagnosis not present

## 2018-07-06 DIAGNOSIS — I4892 Unspecified atrial flutter: Secondary | ICD-10-CM | POA: Diagnosis not present

## 2018-07-06 NOTE — Progress Notes (Signed)
Remote pacemaker transmission.   

## 2018-08-08 DIAGNOSIS — R791 Abnormal coagulation profile: Secondary | ICD-10-CM | POA: Diagnosis not present

## 2018-08-19 NOTE — Progress Notes (Signed)
Patient ID: Andrew [name], male    DOB: 09-17-1935, 83 y.o.   MRN: [id number]  HPI  Andrew [name] is a 83 y/o male with a history of DM, hyperlipidemia, HTN, atrial fibrillation, previous tobacco use and chronic heart failure.   Echo report from 03/25/2018 reviewed and showed an EF of 50-55% along with mild/moderate Andrew.   Admitted 03/23/2018 due to complete heart block along with HF exacerbation. Cardiology consult obtained. Initially given IV lasix and then transitioned to oral diuretics. Transferred after 2 days to [facility] for pacemaker implantation. Discharged from there the following day.    He presents today for a follow-up visit with a chief complaint of minimal fatigue upon moderate exertion. He describes this as chronic in nature having been present for several years. He has associated pedal edema and minimal weight gain along with this. He denies any difficulty sleeping, dizziness, abdominal distention, palpitations, chest pain, shortness of breath or cough. Does notice that he feels a little short of breath when wearing a mask due to COVID restrictions.    Past Medical History:  Diagnosis Date  . CHF (congestive heart failure) (HCC)   . Diabetes mellitus without complication (HCC)   . Essential hypertension   . Mixed hyperlipidemia   . Permanent atrial fibrillation    a. s/p catheter ablation ~ 12 yrs ago @ [facility] per family->unsuccessful; b. CHA2DS2VASc = 5-->chronic coumadin.   Past Surgical History:  Procedure Laterality Date  . APPENDECTOMY    . INSERT / REPLACE / REMOVE PACEMAKER    . PACEMAKER IMPLANT N/A 03/25/2018   Procedure: PACEMAKER IMPLANT;  Surgeon: Hillis Range, MD;  Location: [facility] INV[facility] CV LAB;  Service: Cardiovascular;  Laterality: N/A;   Family History  Problem Relation Age of Onset  . CVA Mother   . Bladder Cancer Father    Social History   Tobacco Use  . Smoking status: Former Smoker    Packs/day: 1.50    Years: 35.00    Pack years: 52.50  .  Smokeless tobacco: Never Used  Substance Use Topics  . Alcohol use: Yes    Comment: 1.5 beers a month   Allergies  Allergen Reactions  . Methyldopa Nausea Only and Palpitations    Other reaction(s): Unknown Other reaction(s): NAUSEA 'palpitation" Other reaction(s): NAUSEA Other reaction(s): NAUSEA    Prior to Admission medications   Medication Sig Start Date End Date Taking? Authorizing Provider  amLODipine (NORVASC) 5 MG tablet Take 1 tablet (5 mg total) by mouth daily. 04/01/18  Yes Clarisa Kindred A, FNP  doxazosin (CARDURA) 4 MG tablet Take 4 mg by mouth Nightly. 12/31/16  Yes [provider]  furosemide (LASIX) 80 MG tablet Take 80 mg by mouth daily.   Yes [provider]  glimepiride (AMARYL) 2 MG tablet Take 4 mg by mouth daily. 08/14/16  Yes [provider]  lisinopril (PRINIVIL,ZESTRIL) 40 MG tablet Take 1 tablet (40 mg total) by mouth daily. 03/27/18  Yes Laverda Page B, NP  metFORMIN (GLUCOPHAGE) 1000 MG tablet Take 1,000 mg by mouth 2 (two) times daily.   Yes [provider]  PIOGLITAZONE HCL PO Take 15 mg by mouth daily.    Yes [provider]  Potassium 99 MG TABS Take 1.5 tablets by mouth daily.   Yes [provider]  simvastatin (ZOCOR) 20 MG tablet Take 20 mg by mouth Nightly. 12/31/16  Yes [provider]  vitamin B-12 (CYANOCOBALAMIN) 1000 MCG tablet Take 1,000 mcg by mouth  daily.   Yes [provider]  warfarin (COUMADIN) 2 MG tablet Take 2 mg by mouth See admin instructions. Take 1 tablet by mouth  Monday-Friday  Take 1/2 tablet by mouth Saturday and Sunday.   Yes [provider]    Review of Systems  Constitutional: Positive for fatigue (with moderate exertion). Negative for appetite change.  HENT: Negative for congestion, rhinorrhea and sore throat.   Respiratory: Negative for chest tightness and shortness of breath.   Cardiovascular: Positive for leg swelling. Negative for chest  pain and palpitations.  Gastrointestinal: Negative for abdominal distention and abdominal pain.  Genitourinary: Negative.   Musculoskeletal: Negative for back pain and neck pain.  Skin: Negative.   Neurological: Negative for dizziness and light-headedness.  Hematological: Negative for adenopathy. Does not bruise/bleed easily.  Psychiatric/Behavioral: Negative for dysphoric mood and sleep disturbance (sleeping on 1 pillow). The patient is not nervous/anxious.    Vitals:   08/22/18 1057  BP: (!) 136/95  Pulse: 73  Resp: 18  SpO2: 100%  Weight: 176 lb 8 oz (80.1 kg)  Height: 5\' 10"  (1.778 m)   Wt Readings from Last 3 Encounters:  08/22/18 176 lb 8 oz (80.1 kg)  07/01/18 173 lb 12.8 oz (78.8 kg)  06/10/18 173 lb 8 oz (78.7 kg)   Lab Results  Component Value Date   CREATININE 0.82 03/26/2018   CREATININE 0.74 03/25/2018   CREATININE 0.99 03/23/2018    Physical Exam Vitals signs and nursing note reviewed.  Constitutional:      Appearance: Normal appearance.  HENT:     Head: Normocephalic and atraumatic.  Neck:     Musculoskeletal: Normal range of motion and neck supple.  Cardiovascular:     Rate and Rhythm: Normal rate and regular rhythm.  Pulmonary:     Effort: Pulmonary effort is normal. No respiratory distress.     Breath sounds: No wheezing or rales.  Abdominal:     General: There is no distension.     Palpations: Abdomen is soft.  Musculoskeletal:        General: No tenderness.     Right lower leg: Edema (1+ pitting) present.     Left lower leg: Edema (1+ pitting) present.  Neurological:     General: No focal deficit present.     Mental Status: He is alert and oriented to person, place, and time.  Psychiatric:        Mood and Affect: Mood normal.        Behavior: Behavior normal.        Thought Content: Thought content normal.    Assessment & Plan:  1: Chronic heart failure with preserved ejection fraction- - NYHA class II - euvolemic today - weighing  daily; reminded to call for an overnight weight gain of >2 pounds or a weekly weight gain of >5 pounds - weight up 3 pounds from last visit here 5 months ago - not adding salt and has been reading food labels - wearing supports socks daily with removal at bedtime - does elevate his legs when sitting for long periods of time - BNP 03/23/2018 was 314.0  2: HTN- - BP mildly elevated today - saw PCP Edwina Barth) 05/30/2018 & returns in one week - BMP from 04/13/2018 reviewed and showed sodium 144, potassium 4.1, creatinine 0.9 and GFR 81  3: DM- - glucose this morning was 126 - A1c 04/13/2018 was 8.4%  4: Atrial fibrillation- - PPM placed 03/25/2018 - ablation done ~ 10-12 years ago -  INR 08/08/2018 was 2.1  Medication list was reviewed.   Will not make a return appointment for patient at this time. Advised patient that he could call back at anytime to make another appointment.

## 2018-08-22 ENCOUNTER — Other Ambulatory Visit: Payer: Self-pay

## 2018-08-22 ENCOUNTER — Ambulatory Visit: Payer: Medicare HMO | Attending: Family | Admitting: Family

## 2018-08-22 ENCOUNTER — Encounter: Payer: Self-pay | Admitting: Family

## 2018-08-22 VITALS — BP 136/95 | HR 73 | Resp 18 | Ht 70.0 in | Wt 176.5 lb

## 2018-08-22 DIAGNOSIS — Z8052 Family history of malignant neoplasm of bladder: Secondary | ICD-10-CM | POA: Insufficient documentation

## 2018-08-22 DIAGNOSIS — Z7901 Long term (current) use of anticoagulants: Secondary | ICD-10-CM | POA: Insufficient documentation

## 2018-08-22 DIAGNOSIS — I4821 Permanent atrial fibrillation: Secondary | ICD-10-CM | POA: Insufficient documentation

## 2018-08-22 DIAGNOSIS — E119 Type 2 diabetes mellitus without complications: Secondary | ICD-10-CM | POA: Diagnosis not present

## 2018-08-22 DIAGNOSIS — Z79899 Other long term (current) drug therapy: Secondary | ICD-10-CM | POA: Insufficient documentation

## 2018-08-22 DIAGNOSIS — Z888 Allergy status to other drugs, medicaments and biological substances status: Secondary | ICD-10-CM | POA: Insufficient documentation

## 2018-08-22 DIAGNOSIS — I5032 Chronic diastolic (congestive) heart failure: Secondary | ICD-10-CM | POA: Insufficient documentation

## 2018-08-22 DIAGNOSIS — Z87891 Personal history of nicotine dependence: Secondary | ICD-10-CM | POA: Diagnosis not present

## 2018-08-22 DIAGNOSIS — E782 Mixed hyperlipidemia: Secondary | ICD-10-CM | POA: Insufficient documentation

## 2018-08-22 DIAGNOSIS — Z7984 Long term (current) use of oral hypoglycemic drugs: Secondary | ICD-10-CM | POA: Insufficient documentation

## 2018-08-22 DIAGNOSIS — I110 Hypertensive heart disease with heart failure: Secondary | ICD-10-CM | POA: Insufficient documentation

## 2018-08-22 DIAGNOSIS — Z950 Presence of cardiac pacemaker: Secondary | ICD-10-CM | POA: Insufficient documentation

## 2018-08-22 DIAGNOSIS — I10 Essential (primary) hypertension: Secondary | ICD-10-CM

## 2018-08-22 NOTE — Patient Instructions (Signed)
Continue weighing daily and call for an overnight weight gain of > 2 pounds or a weekly weight gain of >5 pounds. 

## 2018-08-26 DIAGNOSIS — E119 Type 2 diabetes mellitus without complications: Secondary | ICD-10-CM | POA: Diagnosis not present

## 2018-09-02 DIAGNOSIS — L89612 Pressure ulcer of right heel, stage 2: Secondary | ICD-10-CM | POA: Diagnosis not present

## 2018-09-02 DIAGNOSIS — E1165 Type 2 diabetes mellitus with hyperglycemia: Secondary | ICD-10-CM | POA: Diagnosis not present

## 2018-09-02 DIAGNOSIS — E119 Type 2 diabetes mellitus without complications: Secondary | ICD-10-CM | POA: Diagnosis not present

## 2018-09-02 DIAGNOSIS — R413 Other amnesia: Secondary | ICD-10-CM | POA: Diagnosis not present

## 2018-09-02 DIAGNOSIS — I4891 Unspecified atrial fibrillation: Secondary | ICD-10-CM | POA: Diagnosis not present

## 2018-09-02 DIAGNOSIS — I10 Essential (primary) hypertension: Secondary | ICD-10-CM | POA: Diagnosis not present

## 2018-09-02 DIAGNOSIS — Z0001 Encounter for general adult medical examination with abnormal findings: Secondary | ICD-10-CM | POA: Diagnosis not present

## 2018-09-02 DIAGNOSIS — I4892 Unspecified atrial flutter: Secondary | ICD-10-CM | POA: Diagnosis not present

## 2018-09-02 DIAGNOSIS — E782 Mixed hyperlipidemia: Secondary | ICD-10-CM | POA: Diagnosis not present

## 2018-09-05 DIAGNOSIS — B351 Tinea unguium: Secondary | ICD-10-CM | POA: Diagnosis not present

## 2018-09-05 DIAGNOSIS — S90851A Superficial foreign body, right foot, initial encounter: Secondary | ICD-10-CM | POA: Diagnosis not present

## 2018-09-05 DIAGNOSIS — E119 Type 2 diabetes mellitus without complications: Secondary | ICD-10-CM | POA: Diagnosis not present

## 2018-09-14 DIAGNOSIS — I4892 Unspecified atrial flutter: Secondary | ICD-10-CM | POA: Diagnosis not present

## 2018-09-14 DIAGNOSIS — I4891 Unspecified atrial fibrillation: Secondary | ICD-10-CM | POA: Diagnosis not present

## 2018-09-21 DIAGNOSIS — I4891 Unspecified atrial fibrillation: Secondary | ICD-10-CM | POA: Diagnosis not present

## 2018-09-21 DIAGNOSIS — I4892 Unspecified atrial flutter: Secondary | ICD-10-CM | POA: Diagnosis not present

## 2018-09-30 ENCOUNTER — Ambulatory Visit (INDEPENDENT_AMBULATORY_CARE_PROVIDER_SITE_OTHER): Payer: Medicare HMO | Admitting: *Deleted

## 2018-09-30 DIAGNOSIS — R001 Bradycardia, unspecified: Secondary | ICD-10-CM | POA: Diagnosis not present

## 2018-09-30 LAB — CUP PACEART REMOTE DEVICE CHECK
Battery Remaining Longevity: 95 mo
Battery Remaining Percentage: 95.5 %
Battery Voltage: 3.01 V
Brady Statistic RV Percent Paced: 70 %
Date Time Interrogation Session: 20200828163343
Implantable Lead Implant Date: 20200221
Implantable Lead Location: 753860
Implantable Pulse Generator Implant Date: 20200221
Lead Channel Impedance Value: 590 Ohm
Lead Channel Pacing Threshold Amplitude: 0.75 V
Lead Channel Pacing Threshold Pulse Width: 0.5 ms
Lead Channel Sensing Intrinsic Amplitude: 12 mV
Lead Channel Setting Pacing Amplitude: 3.5 V
Lead Channel Setting Pacing Pulse Width: 0.5 ms
Lead Channel Setting Sensing Sensitivity: 2 mV
Pulse Gen Model: 1272
Pulse Gen Serial Number: 9114203

## 2018-10-04 ENCOUNTER — Encounter: Payer: Self-pay | Admitting: Cardiology

## 2018-10-04 NOTE — Progress Notes (Signed)
Remote pacemaker transmission.   

## 2018-10-06 ENCOUNTER — Emergency Department: Payer: Medicare HMO

## 2018-10-06 ENCOUNTER — Encounter: Payer: Self-pay | Admitting: Emergency Medicine

## 2018-10-06 ENCOUNTER — Inpatient Hospital Stay
Admission: EM | Admit: 2018-10-06 | Discharge: 2018-10-08 | DRG: 871 | Disposition: A | Discharge status: Home Health | Payer: Medicare HMO | Attending: Internal Medicine | Admitting: Internal Medicine

## 2018-10-06 ENCOUNTER — Other Ambulatory Visit: Payer: Self-pay

## 2018-10-06 DIAGNOSIS — J189 Pneumonia, unspecified organism: Secondary | ICD-10-CM | POA: Diagnosis not present

## 2018-10-06 DIAGNOSIS — R6889 Other general symptoms and signs: Secondary | ICD-10-CM | POA: Diagnosis not present

## 2018-10-06 DIAGNOSIS — D696 Thrombocytopenia, unspecified: Secondary | ICD-10-CM | POA: Diagnosis present

## 2018-10-06 DIAGNOSIS — Z7984 Long term (current) use of oral hypoglycemic drugs: Secondary | ICD-10-CM

## 2018-10-06 DIAGNOSIS — E119 Type 2 diabetes mellitus without complications: Secondary | ICD-10-CM | POA: Diagnosis present

## 2018-10-06 DIAGNOSIS — I442 Atrioventricular block, complete: Secondary | ICD-10-CM | POA: Diagnosis not present

## 2018-10-06 DIAGNOSIS — Z20828 Contact with and (suspected) exposure to other viral communicable diseases: Secondary | ICD-10-CM | POA: Diagnosis present

## 2018-10-06 DIAGNOSIS — E872 Acidosis, unspecified: Secondary | ICD-10-CM

## 2018-10-06 DIAGNOSIS — R509 Fever, unspecified: Secondary | ICD-10-CM | POA: Diagnosis not present

## 2018-10-06 DIAGNOSIS — R251 Tremor, unspecified: Secondary | ICD-10-CM | POA: Diagnosis present

## 2018-10-06 DIAGNOSIS — A419 Sepsis, unspecified organism: Secondary | ICD-10-CM | POA: Diagnosis not present

## 2018-10-06 DIAGNOSIS — I480 Paroxysmal atrial fibrillation: Secondary | ICD-10-CM | POA: Diagnosis not present

## 2018-10-06 DIAGNOSIS — I110 Hypertensive heart disease with heart failure: Secondary | ICD-10-CM | POA: Diagnosis present

## 2018-10-06 DIAGNOSIS — E876 Hypokalemia: Secondary | ICD-10-CM | POA: Diagnosis not present

## 2018-10-06 DIAGNOSIS — Z87891 Personal history of nicotine dependence: Secondary | ICD-10-CM | POA: Diagnosis not present

## 2018-10-06 DIAGNOSIS — I5032 Chronic diastolic (congestive) heart failure: Secondary | ICD-10-CM | POA: Diagnosis present

## 2018-10-06 DIAGNOSIS — Z7901 Long term (current) use of anticoagulants: Secondary | ICD-10-CM | POA: Diagnosis not present

## 2018-10-06 DIAGNOSIS — Z8052 Family history of malignant neoplasm of bladder: Secondary | ICD-10-CM | POA: Diagnosis not present

## 2018-10-06 DIAGNOSIS — I4821 Permanent atrial fibrillation: Secondary | ICD-10-CM | POA: Diagnosis not present

## 2018-10-06 DIAGNOSIS — I248 Other forms of acute ischemic heart disease: Secondary | ICD-10-CM | POA: Diagnosis not present

## 2018-10-06 DIAGNOSIS — Z823 Family history of stroke: Secondary | ICD-10-CM | POA: Diagnosis not present

## 2018-10-06 DIAGNOSIS — Z79899 Other long term (current) drug therapy: Secondary | ICD-10-CM

## 2018-10-06 DIAGNOSIS — D649 Anemia, unspecified: Secondary | ICD-10-CM | POA: Diagnosis present

## 2018-10-06 DIAGNOSIS — R7989 Other specified abnormal findings of blood chemistry: Secondary | ICD-10-CM | POA: Diagnosis not present

## 2018-10-06 DIAGNOSIS — I495 Sick sinus syndrome: Secondary | ICD-10-CM | POA: Diagnosis present

## 2018-10-06 DIAGNOSIS — Z888 Allergy status to other drugs, medicaments and biological substances status: Secondary | ICD-10-CM

## 2018-10-06 DIAGNOSIS — E782 Mixed hyperlipidemia: Secondary | ICD-10-CM | POA: Diagnosis present

## 2018-10-06 DIAGNOSIS — R1310 Dysphagia, unspecified: Secondary | ICD-10-CM | POA: Diagnosis present

## 2018-10-06 DIAGNOSIS — Z950 Presence of cardiac pacemaker: Secondary | ICD-10-CM

## 2018-10-06 DIAGNOSIS — I447 Left bundle-branch block, unspecified: Secondary | ICD-10-CM | POA: Diagnosis present

## 2018-10-06 DIAGNOSIS — Z9049 Acquired absence of other specified parts of digestive tract: Secondary | ICD-10-CM | POA: Diagnosis not present

## 2018-10-06 DIAGNOSIS — K529 Noninfective gastroenteritis and colitis, unspecified: Secondary | ICD-10-CM | POA: Diagnosis present

## 2018-10-06 LAB — URINALYSIS, ROUTINE W REFLEX MICROSCOPIC
Bacteria, UA: NONE SEEN
Bilirubin Urine: NEGATIVE
Glucose, UA: NEGATIVE mg/dL
Hgb urine dipstick: NEGATIVE
Ketones, ur: 5 mg/dL — AB
Leukocytes,Ua: NEGATIVE
Nitrite: NEGATIVE
Protein, ur: 30 mg/dL — AB
Specific Gravity, Urine: 1.018 (ref 1.005–1.030)
pH: 5 (ref 5.0–8.0)

## 2018-10-06 LAB — CBC
HCT: 29.7 % — ABNORMAL LOW (ref 39.0–52.0)
Hemoglobin: 9.8 g/dL — ABNORMAL LOW (ref 13.0–17.0)
MCH: 31.7 pg (ref 26.0–34.0)
MCHC: 33 g/dL (ref 30.0–36.0)
MCV: 96.1 fL (ref 80.0–100.0)
Platelets: 88 10*3/uL — ABNORMAL LOW (ref 150–400)
RBC: 3.09 MIL/uL — ABNORMAL LOW (ref 4.22–5.81)
RDW: 14.6 % (ref 11.5–15.5)
WBC: 5.7 10*3/uL (ref 4.0–10.5)
nRBC: 0 % (ref 0.0–0.2)

## 2018-10-06 LAB — CBC WITH DIFFERENTIAL/PLATELET
Abs Immature Granulocytes: 0.03 10*3/uL (ref 0.00–0.07)
Basophils Absolute: 0 10*3/uL (ref 0.0–0.1)
Basophils Relative: 0 %
Eosinophils Absolute: 0.1 10*3/uL (ref 0.0–0.5)
Eosinophils Relative: 1 %
HCT: 37 % — ABNORMAL LOW (ref 39.0–52.0)
Hemoglobin: 11.8 g/dL — ABNORMAL LOW (ref 13.0–17.0)
Immature Granulocytes: 0 %
Lymphocytes Relative: 3 %
Lymphs Abs: 0.2 10*3/uL — ABNORMAL LOW (ref 0.7–4.0)
MCH: 31.4 pg (ref 26.0–34.0)
MCHC: 31.9 g/dL (ref 30.0–36.0)
MCV: 98.4 fL (ref 80.0–100.0)
Monocytes Absolute: 0.5 10*3/uL (ref 0.1–1.0)
Monocytes Relative: 7 %
Neutro Abs: 6.8 10*3/uL (ref 1.7–7.7)
Neutrophils Relative %: 89 %
Platelets: 104 10*3/uL — ABNORMAL LOW (ref 150–400)
RBC: 3.76 MIL/uL — ABNORMAL LOW (ref 4.22–5.81)
RDW: 14.3 % (ref 11.5–15.5)
WBC: 7.6 10*3/uL (ref 4.0–10.5)
nRBC: 0 % (ref 0.0–0.2)

## 2018-10-06 LAB — COMPREHENSIVE METABOLIC PANEL
ALT: 22 U/L (ref 0–44)
AST: 25 U/L (ref 15–41)
Albumin: 3.9 g/dL (ref 3.5–5.0)
Alkaline Phosphatase: 68 U/L (ref 38–126)
Anion gap: 14 (ref 5–15)
BUN: 35 mg/dL — ABNORMAL HIGH (ref 8–23)
CO2: 25 mmol/L (ref 22–32)
Calcium: 9.3 mg/dL (ref 8.9–10.3)
Chloride: 104 mmol/L (ref 98–111)
Creatinine, Ser: 1.07 mg/dL (ref 0.61–1.24)
GFR calc Af Amer: >60 mL/min (ref >60)
GFR calc non Af Amer: >60 mL/min (ref >60)
Glucose, Bld: 240 mg/dL — ABNORMAL HIGH (ref 70–99)
Potassium: 3.9 mmol/L (ref 3.5–5.1)
Sodium: 143 mmol/L (ref 135–145)
Total Bilirubin: 1.3 mg/dL — ABNORMAL HIGH (ref 0.3–1.2)
Total Protein: 6.6 g/dL (ref 6.5–8.1)

## 2018-10-06 LAB — LACTIC ACID, PLASMA
Lactic Acid, Venous: 3.9 mmol/L (ref 0.5–1.9)
Lactic Acid, Venous: 4.2 mmol/L (ref 0.5–1.9)
Lactic Acid, Venous: 2.7 mmol/L (ref 0.5–1.9)

## 2018-10-06 LAB — TROPONIN I (HIGH SENSITIVITY)
Troponin I (High Sensitivity): 111 ng/L (ref <18)
Troponin I (High Sensitivity): 118 ng/L (ref <18)
Troponin I (High Sensitivity): 117 ng/L (ref <18)

## 2018-10-06 LAB — APTT
aPTT: 32 seconds (ref 24–36)
aPTT: 41 seconds — ABNORMAL HIGH (ref 24–36)

## 2018-10-06 LAB — PROTIME-INR
INR: 1.4 — ABNORMAL HIGH (ref 0.8–1.2)
INR: 1.6 — ABNORMAL HIGH (ref 0.8–1.2)
Prothrombin Time: 16.8 seconds — ABNORMAL HIGH (ref 11.4–15.2)
Prothrombin Time: 19 seconds — ABNORMAL HIGH (ref 11.4–15.2)

## 2018-10-06 LAB — CORTISOL-AM, BLOOD: Cortisol - AM: 37.5 ug/dL — ABNORMAL HIGH (ref 6.7–22.6)

## 2018-10-06 LAB — PROCALCITONIN: Procalcitonin: 5.52 ng/mL

## 2018-10-06 LAB — SARS CORONAVIRUS 2 BY RT PCR (HOSPITAL ORDER, PERFORMED IN ~~LOC~~ HOSPITAL LAB): SARS Coronavirus 2: NEGATIVE

## 2018-10-06 MED ORDER — WARFARIN SODIUM 2 MG PO TABS: 2.0000 mg | ORAL_TABLET | ORAL | Status: DC

## 2018-10-06 MED ORDER — AMLODIPINE BESYLATE 5 MG PO TABS
5.0000 mg | ORAL_TABLET | Freq: Every day | ORAL | Status: DC
  Administered 2018-10-07: 5 mg via ORAL
  Filled 2018-10-06 (×2): qty 1

## 2018-10-06 MED ORDER — HEPARIN (PORCINE) 25000 UT/250ML-% IV SOLN
1000.0000 [IU]/h | INTRAVENOUS | Status: DC
  Administered 2018-10-06: 1000 [IU]/h via INTRAVENOUS
  Filled 2018-10-06: qty 250

## 2018-10-06 MED ORDER — VANCOMYCIN HCL IN DEXTROSE 1-5 GM/200ML-% IV SOLN
1000.0000 mg | Freq: Once | INTRAVENOUS | Status: AC
  Administered 2018-10-06: 09:00:00 1000 mg via INTRAVENOUS
  Filled 2018-10-06: qty 200

## 2018-10-06 MED ORDER — ONDANSETRON HCL 4 MG/2ML IJ SOLN
4.0000 mg | Freq: Once | INTRAMUSCULAR | Status: AC
  Administered 2018-10-06: 4 mg via INTRAVENOUS
  Filled 2018-10-06: qty 2

## 2018-10-06 MED ORDER — FUROSEMIDE 40 MG PO TABS
40.0000 mg | ORAL_TABLET | Freq: Every day | ORAL | Status: DC
  Administered 2018-10-07: 40 mg via ORAL
  Filled 2018-10-06: qty 1

## 2018-10-06 MED ORDER — SIMVASTATIN 20 MG PO TABS
20.0000 mg | ORAL_TABLET | Freq: Every evening | ORAL | Status: DC
  Administered 2018-10-06 – 2018-10-07 (×2): 20 mg via ORAL
  Filled 2018-10-06 (×2): qty 1

## 2018-10-06 MED ORDER — METRONIDAZOLE IN NACL 5-0.79 MG/ML-% IV SOLN
500.0000 mg | Freq: Three times a day (TID) | INTRAVENOUS | Status: DC
  Administered 2018-10-06 – 2018-10-07 (×3): 500 mg via INTRAVENOUS
  Filled 2018-10-06 (×5): qty 100

## 2018-10-06 MED ORDER — LISINOPRIL 20 MG PO TABS
40.0000 mg | ORAL_TABLET | Freq: Every day | ORAL | Status: DC
  Administered 2018-10-07: 22:00:00 40 mg via ORAL
  Filled 2018-10-06 (×2): qty 2

## 2018-10-06 MED ORDER — WARFARIN - PHARMACIST DOSING INPATIENT
Freq: Every day | Status: DC
  Administered 2018-10-06: 18:00:00
  Filled 2018-10-06: qty 1

## 2018-10-06 MED ORDER — SODIUM CHLORIDE 0.9 % IV SOLN
Freq: Once | INTRAVENOUS | Status: AC
  Administered 2018-10-06: 08:00:00 via INTRAVENOUS

## 2018-10-06 MED ORDER — WARFARIN SODIUM 4 MG PO TABS
4.0000 mg | ORAL_TABLET | Freq: Once | ORAL | Status: AC
  Administered 2018-10-06: 4 mg via ORAL
  Filled 2018-10-06: qty 1

## 2018-10-06 MED ORDER — HEPARIN BOLUS VIA INFUSION
2000.0000 [IU] | Freq: Once | INTRAVENOUS | Status: AC
  Administered 2018-10-06: 2000 [IU] via INTRAVENOUS
  Filled 2018-10-06: qty 2000

## 2018-10-06 MED ORDER — ACETAMINOPHEN 325 MG PO TABS: 650.0000 mg | ORAL_TABLET | Freq: Four times a day (QID) | ORAL | Status: DC | PRN

## 2018-10-06 MED ORDER — VANCOMYCIN HCL 10 G IV SOLR
1500.0000 mg | INTRAVENOUS | Status: DC
  Administered 2018-10-07: 1500 mg via INTRAVENOUS
  Filled 2018-10-06: qty 1500

## 2018-10-06 MED ORDER — ACETAMINOPHEN 650 MG RE SUPP: 650.0000 mg | Freq: Four times a day (QID) | RECTAL | Status: DC | PRN

## 2018-10-06 MED ORDER — VANCOMYCIN HCL IN DEXTROSE 1-5 GM/200ML-% IV SOLN
1000.0000 mg | Freq: Once | INTRAVENOUS | Status: AC
  Administered 2018-10-06: 1000 mg via INTRAVENOUS
  Filled 2018-10-06: qty 200

## 2018-10-06 MED ORDER — DOXAZOSIN MESYLATE 4 MG PO TABS
4.0000 mg | ORAL_TABLET | Freq: Every evening | ORAL | Status: DC
  Administered 2018-10-07: 4 mg via ORAL
  Filled 2018-10-06: qty 1

## 2018-10-06 MED ORDER — SODIUM CHLORIDE 0.9 % IV SOLN
2.0000 g | Freq: Two times a day (BID) | INTRAVENOUS | Status: DC
  Administered 2018-10-06 – 2018-10-07 (×2): 2 g via INTRAVENOUS
  Filled 2018-10-06 (×3): qty 2

## 2018-10-06 MED ORDER — PIPERACILLIN-TAZOBACTAM 3.375 G IVPB 30 MIN
3.3750 g | Freq: Once | INTRAVENOUS | Status: AC
  Administered 2018-10-06: 3.375 g via INTRAVENOUS
  Filled 2018-10-06: qty 50

## 2018-10-06 NOTE — Progress Notes (Signed)
ANTICOAGULATION CONSULT NOTE - Initial Consult  Pharmacy Consult for Heparin Indication: chest pain/ACS  Allergies  Allergen Reactions  . Methyldopa Nausea Only and Palpitations    Other reaction(s): Unknown Other reaction(s): NAUSEA 'palpitation" Other reaction(s): NAUSEA Other reaction(s): NAUSEA    Patient Measurements: Height: 5\' 8"  (172.7 cm) Weight: 181 lb 11.2 oz (82.4 kg) IBW/kg (Calculated) : 68.4 HEPARIN DW (KG): 82.4  Vital Signs: Temp: 99.2 F (37.3 C) (09/03 2043) Temp Source: Oral (09/03 2043) BP: 106/59 (09/03 2045) Pulse Rate: 57 (09/03 2045)  Labs: Recent Labs    10/06/18 0736 10/06/18 1039  HGB 11.8*  --   HCT 37.0*  --   PLT 104*  --   APTT 32  --   LABPROT 16.8*  --   INR 1.4*  --   CREATININE 1.07  --   TROPONINIHS 111* 118*    Estimated Creatinine Clearance: 56.7 mL/min (by C-G formula based on SCr of 1.07 mg/dL).   Medical History: Past Medical History:  Diagnosis Date  . CHF (congestive heart failure) ([place])   . Diabetes mellitus without complication ([place])   . Essential hypertension   . Mixed hyperlipidemia   . Permanent atrial fibrillation    a. s/p catheter ablation ~ 12 yrs ago @ [facility] per family->unsuccessful; b. CHA2DS2VASc = 5-->chronic coumadin.    Medications:  Scheduled:  . amLODipine  5 mg Oral QHS  . doxazosin  4 mg Oral Nightly  . [START ON 10/07/2018] furosemide  40 mg Oral Daily  . lisinopril  40 mg Oral QHS  . simvastatin  20 mg Oral Nightly  . Warfarin - Pharmacist Dosing Inpatient   Does not apply q1800   Assessment: Pharmacy asked to initiate Heparin for ACS.  Pt received Warfarin 4mg  po x 1 today at 2585. Platelets low.  INR 1.4 this morning >> 1.6 tonight.  Goal of Therapy:  Heparin level 0.3-0.7 units/ml Monitor platelets by anticoagulation protocol: Yes   Plan:  Heparin bolus 2000 units (reduced due to elevated aPTT and INR) Heparin infusion at 1000 units/hr Check HL 8 hours after infusion  started F/U INR and CBC per protocol  Hart Robinsons A 10/06/2018,10:50 PM

## 2018-10-06 NOTE — ED Notes (Signed)
Date and time results received: 10/06/18 08:15 (use smartphrase ".now" to insert current time)  Test: Lactic Critical Value: 3.9  Name of Provider Notified: Dr. Jimmye Norman  Orders Received? Or Actions Taken?: Acknowledged

## 2018-10-06 NOTE — ED Notes (Addendum)
Pt visualized.  PT denies any needs at this time. Family at bedside. VSS. Pt in NAD. Will continue to monitor.

## 2018-10-06 NOTE — ED Notes (Signed)
ED TO INPATIENT HANDOFF REPORT  ED Nurse Name and Phone #: Tobi Bastos [phone]  S Name/Age/Gender Andrew [name] 83 y.o. male Room/Bed: ED04A/ED04A  Code Status   Code Status: Full Code  Home/SNF/Other Home Patient oriented to: self, place, time and situation Is this baseline? Yes   Triage Complete: Triage complete  Chief Complaint Shaking  Triage Note C/O shaking all over.  States woke up at 0230 shaking all over.  Has had 2 other episodes of same.     Allergies Allergies  Allergen Reactions  . Methyldopa Nausea Only and Palpitations    Other reaction(s): Unknown Other reaction(s): NAUSEA 'palpitation" Other reaction(s): NAUSEA Other reaction(s): NAUSEA     Level of Care/Admitting Diagnosis ED Disposition    ED Disposition Condition Comment   Admit  Hospital Area: [facility] [[id number]]  Level of Care: Telemetry [5]  Covid Evaluation: Asymptomatic Screening Protocol (No Symptoms)  Diagnosis: Sepsis [facility]) [[id number]]  Admitting Physician: Jama Flavors [[id number]]  Attending Physician: Webb Silversmith ACHIENG [phone]  Estimated length of stay: past midnight tomorrow  Certification:: I certify this patient will need inpatient services for at least 2 midnights  PT Class (Do Not Modify): Inpatient [101]  PT Acc Code (Do Not Modify): Private [1]       B Medical/Surgery History Past Medical History:  Diagnosis Date  . CHF (congestive heart failure) (HCC)   . Diabetes mellitus without complication (HCC)   . Essential hypertension   . Mixed hyperlipidemia   . Permanent atrial fibrillation    a. s/p catheter ablation ~ 12 yrs ago @ [facility] per family->unsuccessful; b. CHA2DS2VASc = 5-->chronic coumadin.   Past Surgical History:  Procedure Laterality Date  . APPENDECTOMY    . INSERT / REPLACE / REMOVE PACEMAKER    . PACEMAKER IMPLANT N/A 03/25/2018   Procedure: PACEMAKER IMPLANT;  Surgeon: Hillis Range, MD;  [facility]: [facility] INV[facility] CV LAB;  Service:  Cardiovascular;  Laterality: N/A;     A IV Location/Drains/Wounds Patient Lines/Drains/Airways Status   Active Line/Drains/Airways    Name:   Placement date:   Placement time:   Site:   Days:   Peripheral IV 10/06/18 Left Forearm   10/06/18    0737    Forearm   less than 1   Peripheral IV 10/06/18 Right Antecubital   10/06/18    0737    Antecubital   less than 1          Intake/Output Last 24 hours  Intake/Output Summary (Last 24 hours) at 10/06/2018 1100 Last data filed at 10/06/2018 1014 Gross per 24 hour  Intake 748.46 ml  Output -  Net 748.46 ml    Labs/Imaging Results for orders placed or performed during the hospital encounter of 10/06/18 (from the past 48 hour(s))  Lactic acid, plasma     Status: Abnormal   Collection Time: 10/06/18  7:36 AM  Result Value Ref Range   Lactic Acid, Venous 3.9 (HH) 0.5 - 1.9 mmol/L    Comment: CRITICAL RESULT CALLED TO, READ BACK BY AND VERIFIED WITH Daria Mcmeekin ON 10/06/18 AT 0809 TIK Performed at [facility], [address] [place], [place] [address]   Comprehensive metabolic panel     Status: Abnormal   Collection Time: 10/06/18  7:36 AM  Result Value Ref Range   Sodium 143 135 - 145 mmol/L   Potassium 3.9 3.5 - 5.1 mmol/L   Chloride 104 98 - 111 mmol/L   CO2 25 22 - 32 mmol/L  Glucose, Bld 240 (H) 70 - 99 mg/dL   BUN 35 (H) 8 - 23 mg/dL   Creatinine, Ser 1.07 0.61 - 1.24 mg/dL   Calcium 9.3 8.9 - 10.3 mg/dL   Total Protein 6.6 6.5 - 8.1 g/dL   Albumin 3.9 3.5 - 5.0 g/dL   AST 25 15 - 41 U/L   ALT 22 0 - 44 U/L   Alkaline Phosphatase 68 38 - 126 U/L   Total Bilirubin 1.3 (H) 0.3 - 1.2 mg/dL   GFR calc non Af Amer >60 >60 mL/min   GFR calc Af Amer >60 >60 mL/min   Anion gap 14 5 - 15    Comment: Performed at [facility], [address] [place], [place] [address]  CBC WITH DIFFERENTIAL     Status: Abnormal   Collection Time: 10/06/18  7:36 AM  Result Value Ref Range   WBC 7.6 4.0 - 10.5 K/uL   RBC  3.76 (L) 4.22 - 5.81 MIL/uL   Hemoglobin 11.8 (L) 13.0 - 17.0 g/dL   HCT 37.0 (L) 39.0 - 52.0 %   MCV 98.4 80.0 - 100.0 fL   MCH 31.4 26.0 - 34.0 pg   MCHC 31.9 30.0 - 36.0 g/dL   RDW 14.3 11.5 - 15.5 %   Platelets 104 (L) 150 - 400 K/uL    Comment: Immature Platelet Fraction may be clinically indicated, consider ordering this additional test [id number]    nRBC 0.0 0.0 - 0.2 %   Neutrophils Relative % 89 %   Neutro Abs 6.8 1.7 - 7.7 K/uL   Lymphocytes Relative 3 %   Lymphs Abs 0.2 (L) 0.7 - 4.0 K/uL   Monocytes Relative 7 %   Monocytes Absolute 0.5 0.1 - 1.0 K/uL   Eosinophils Relative 1 %   Eosinophils Absolute 0.1 0.0 - 0.5 K/uL   Basophils Relative 0 %   Basophils Absolute 0.0 0.0 - 0.1 K/uL   Immature Granulocytes 0 %   Abs Immature Granulocytes 0.03 0.00 - 0.07 K/uL    Comment: Performed at [facility], [address] [place], [place] [address]  APTT     Status: None   Collection Time: 10/06/18  7:36 AM  Result Value Ref Range   aPTT 32 24 - 36 seconds    Comment: Performed at [facility], [address] [place], [place] [address]  Protime-INR     Status: Abnormal   Collection Time: 10/06/18  7:36 AM  Result Value Ref Range   Prothrombin Time 16.8 (H) 11.4 - 15.2 seconds   INR 1.4 (H) 0.8 - 1.2    Comment: (NOTE) INR goal varies based on device and disease states. Performed at [facility], [address] [place], [place] [address]   Urinalysis, Routine w reflex microscopic     Status: Abnormal   Collection Time: 10/06/18  7:36 AM  Result Value Ref Range   Color, Urine YELLOW (A) YELLOW   APPearance HAZY (A) CLEAR   Specific Gravity, Urine 1.018 1.005 - 1.030   pH 5.0 5.0 - 8.0   Glucose, UA NEGATIVE NEGATIVE mg/dL   Hgb urine dipstick NEGATIVE NEGATIVE   Bilirubin Urine NEGATIVE NEGATIVE   Ketones, ur 5 (A) NEGATIVE mg/dL   Protein, ur 30 (A) NEGATIVE mg/dL   Nitrite NEGATIVE NEGATIVE   Leukocytes,Ua NEGATIVE NEGATIVE    RBC / HPF 0-5 0 - 5 RBC/hpf   WBC, UA 0-5 0 - 5 WBC/hpf   Bacteria, UA NONE  SEEN NONE SEEN   Squamous Epithelial / LPF 0-5 0 - 5   Mucus PRESENT    Hyaline Casts, UA PRESENT     Comment: Performed at [facility], [address] [place], [place] [address]  SARS Coronavirus 2 [facility](Hospital order, Performed in [facility]) Nasopharyngeal Nasopharyngeal Swab     Status: None   Collection Time: 10/06/18  7:36 AM   Specimen: Nasopharyngeal Swab  Result Value Ref Range   SARS Coronavirus 2 NEGATIVE NEGATIVE    Comment: (NOTE) If result is NEGATIVE SARS-CoV-2 target nucleic acids are NOT DETECTED. The SARS-CoV-2 RNA is generally detectable in upper and lower  respiratory specimens during the acute phase of infection. The lowest  concentration of SARS-CoV-2 viral copies this assay can detect is 250  copies / mL. A negative result does not preclude SARS-CoV-2 infection  and should not be used as the sole basis for treatment or other  patient management decisions.  A negative result may occur with  improper specimen collection / handling, submission of specimen other  than nasopharyngeal swab, presence of viral mutation(s) within the  areas targeted by this assay, and inadequate number of viral copies  (<250 copies / mL). A negative result must be combined with clinical  observations, patient history, and epidemiological information. If result is POSITIVE SARS-CoV-2 target nucleic acids are DETECTED. The SARS-CoV-2 RNA is generally detectable in upper and lower  respiratory specimens dur ing the acute phase of infection.  Positive  results are indicative of active infection with SARS-CoV-2.  Clinical  correlation with patient history and other diagnostic information is  necessary to determine patient infection status.  Positive results do  not rule out bacterial infection or co-infection with other viruses. If result is PRESUMPTIVE POSTIVE SARS-CoV-2 nucleic acids MAY BE  PRESENT.   A presumptive positive result was obtained on the submitted specimen  and confirmed on repeat testing.  While 2019 novel coronavirus  (SARS-CoV-2) nucleic acids may be present in the submitted sample  additional confirmatory testing may be necessary for epidemiological  and / or clinical management purposes  to differentiate between  SARS-CoV-2 and other Sarbecovirus currently known to infect humans.  If clinically indicated additional testing with an alternate test  methodology [phone] is advised. The SARS-CoV-2 RNA is generally  detectable in upper and lower respiratory sp ecimens during the acute  phase of infection. The expected result is Negative. Fact Sheet for Patients:  BoilerBrush.com.cyhttps://www.fda.gov/media/136312/download Fact Sheet for Healthcare Providers: https://pope.com/https://www.fda.gov/media/136313/download This test is not yet approved or cleared by the [place] FDA and has been authorized for detection and/or diagnosis of SARS-CoV-2 by FDA under an Emergency Use Authorization (EUA).  This EUA will remain in effect (meaning this test can be used) for the duration of the COVID-19 declaration under Section 564(b)(1) of the Act, 21 U.S.C. section 360bbb-3(b)(1), unless the authorization is terminated or revoked sooner. Performed at [facility], [address] [place], [place] [address]   Troponin I (High Sensitivity)     Status: Abnormal   Collection Time: 10/06/18  7:36 AM  Result Value Ref Range   Troponin I (High Sensitivity) 111 (HH) <18 ng/L    Comment: CRITICAL RESULT CALLED TO, READ BACK BY AND VERIFIED WITH Aivy Akter ON 10/06/18 AT 1030 TIK (NOTE) Elevated high sensitivity troponin I (hsTnI) values and significant  changes across serial measurements may suggest ACS but many other  chronic and acute conditions are known to elevate hsTnI results.  Refer  to the "Links" section for chest pain algorithms and additional  guidance. Performed at [facility], [address] [place], [place] [address]   Lactic acid, plasma     Status: Abnormal   Collection Time: 10/06/18  9:11 AM  Result Value Ref Range   Lactic Acid, Venous 4.2 (HH) 0.5 - 1.9 mmol/L    Comment: CRITICAL RESULT CALLED TO, READ BACK BY AND VERIFIED WITH Parisa Pinela AT [phone] 10/06/2018 DAS Performed at [facility], [address] [place], [place] [address]    Dg Chest Port 1 View  Result Date: 10/06/2018 CLINICAL DATA:  Fever. EXAM: PORTABLE CHEST 1 VIEW COMPARISON:  Chest x-ray dated March 26, 2018. FINDINGS: Unchanged left chest wall pacemaker. Stable cardiomegaly. Mild pulmonary vascular congestion. No focal consolidation, pleural effusion, or pneumothorax. No acute osseous abnormality. IMPRESSION: 1. Mild pulmonary vascular congestion. Electronically Signed   By: Obie DredgeWilliam T Derry M.D.   On: 10/06/2018 08:06    Pending Labs Unresulted Labs (From admission, onward)    Start     Ordered   10/07/18 0500  Protime-INR  Tomorrow morning,   STAT     10/06/18 1033   10/06/18 1055  Cortisol-am, blood  Once,   STAT     10/06/18 1055   10/06/18 1034  Procalcitonin  ONCE - STAT,   STAT     10/06/18 1033   10/06/18 0714  Blood Culture (routine x 2)  BLOOD CULTURE X 2,   STAT     10/06/18 0714   10/06/18 0714  Urine culture  ONCE - STAT,   STAT     10/06/18 0714          Vitals/Pain Today's Vitals   10/06/18 0900 10/06/18 0930 10/06/18 1000 10/06/18 1054  BP: (!) 125/50 (!) 123/53 (!) 126/52   Pulse: 60 63 (!) 56   Resp: (!) 23 (!) 23 (!) 24   Temp:    99.4 F (37.4 C)  TempSrc:    Oral  SpO2: 95% 95% 95%   Weight:      PainSc:        Isolation Precautions No active isolations  Medications Medications  amLODipine (NORVASC) tablet 5 mg (has no administration in time range)  doxazosin (CARDURA) tablet 4 mg (has no administration in time range)  lisinopril (ZESTRIL) tablet 40 mg (has no administration in time range)  simvastatin (ZOCOR)  tablet 20 mg (has no administration in time range)  warfarin (COUMADIN) tablet 2 mg (has no administration in time range)  furosemide (LASIX) tablet 40 mg (has no administration in time range)  acetaminophen (TYLENOL) tablet 650 mg (has no administration in time range)    Or  acetaminophen (TYLENOL) suppository 650 mg (has no administration in time range)  ondansetron (ZOFRAN) injection 4 mg (4 mg Intravenous Given 10/06/18 0744)  0.9 %  sodium chloride infusion ( Intravenous Stopped 10/06/18 0920)  vancomycin (VANCOCIN) IVPB 1000 mg/200 mL premix ( Intravenous Stopped 10/06/18 1013)  piperacillin-tazobactam (ZOSYN) IVPB 3.375 g ( Intravenous Stopped 10/06/18 0905)    Mobility walks with person assist Low fall risk   Focused Assessments Cardiac Assessment Handoff:  Cardiac Rhythm: Normal sinus rhythm Lab Results  Component Value Date   TROPONINI 0.07 (HH) 03/24/2018   No results found for: DDIMER Does the Patient currently have chest pain? No     R Recommendations: See Admitting Provider Note  Report given to:   Additional Notes:

## 2018-10-06 NOTE — H&P (Addendum)
[facility] [place] [facility]   PATIENT NAME: Andrew [name]    MR#:  [id number]  DATE OF BIRTH:  August 02, 1935  DATE OF ADMISSION:  10/06/2018  PRIMARY CARE PHYSICIAN: Gracelyn Nurse, MD   REQUESTING/REFERRING PHYSICIAN: Daryel November, MD  CHIEF COMPLAINT:   Chief Complaint  Patient presents with  . Shaking    HISTORY OF PRESENT ILLNESS:   83 y.o male with past medical history of diabetes mellitus, chronic diastolic CHF, hypertension, hyperlipidemia, atrial fibrillation on Coumadin, and pacemaker placement presenting to the ED with complaints of " rigors and chills".  Patient states he woke up this morning at around 230 with violent shaking without associated symptoms of chest pain, shortness of breath, abdominal pain, dizziness, diarrhea, cough, urinary urgency or frequency.  Denies recent sick contact.  Patient's wife state that he was nauseated but did not vomit. Patient stated he had this episode in the past with no clear cause.  On arrival to the ED, he was febrile temp 100.1 with blood pressure 148/73 mm Hg and pulse rate 63 beats/min. There were no focal neurological deficits; he was alert and oriented x4.  Initial labs revealed lactic acid 3.9, glucose 240, total bilirubin 1.3, platelets 104 otherwise unremarkable CBC, COVID-19 negative, troponin 111, repeat 118, cortisol 37.5, procalcitonin 5.53.  Chest x-ray showed mild pulmonary vascular congestion.  Patient received IVF s bolus and was started on empiric antibiotics in  The ED. He will be admitted for further management.  PAST MEDICAL HISTORY:   Past Medical History:  Diagnosis Date  . CHF (congestive heart failure) (HCC)   . Diabetes mellitus without complication (HCC)   . Essential hypertension   . Mixed hyperlipidemia   . Permanent atrial fibrillation    a. s/p catheter ablation ~ 12 yrs ago @ [facility] per family->unsuccessful; b. CHA2DS2VASc = 5-->chronic coumadin.    PAST SURGICAL  HISTORY:   Past Surgical History:  Procedure Laterality Date  . APPENDECTOMY    . INSERT / REPLACE / REMOVE PACEMAKER    . PACEMAKER IMPLANT N/A 03/25/2018   Procedure: PACEMAKER IMPLANT;  Surgeon: Hillis Range, MD;  Location: [facility] IN[facility] CV LAB;  Service: Cardiovascular;  Laterality: N/A;    SOCIAL HISTORY:   Social History   Tobacco Use  . Smoking status: Former Smoker    Packs/day: 1.50    Years: 35.00    Pack years: 52.50  . Smokeless tobacco: Never Used  Substance Use Topics  . Alcohol use: Yes    Comment: 1.5 beers a month    FAMILY HISTORY:   Family History  Problem Relation Age of Onset  . CVA Mother   . Bladder Cancer Father     DRUG ALLERGIES:   Allergies  Allergen Reactions  . Methyldopa Nausea Only and Palpitations    Other reaction(s): Unknown Other reaction(s): NAUSEA 'palpitation" Other reaction(s): NAUSEA Other reaction(s): NAUSEA     REVIEW OF SYSTEMS:   Review of Systems  Constitutional: Positive for chills and fever. Negative for malaise/fatigue and weight loss.  HENT: Negative for congestion, hearing loss and sore throat.   Eyes: Negative for blurred vision and double vision.  Respiratory: Negative for cough, shortness of breath and wheezing.   Cardiovascular: Negative for chest pain, palpitations, orthopnea and leg swelling.  Gastrointestinal: Positive for nausea and vomiting. Negative for abdominal pain and diarrhea.  Genitourinary: Negative for dysuria and urgency.  Musculoskeletal: Negative for myalgias.  Skin: Negative for rash.  Neurological: Negative for  dizziness, sensory change, speech change, focal weakness and headaches.  Psychiatric/Behavioral: Negative for depression.   MEDICATIONS AT HOME:   Prior to Admission medications   Medication Sig Start Date End Date Taking? Authorizing Provider  amLODipine (NORVASC) 5 MG tablet Take 1 tablet (5 mg total) by mouth daily. Patient taking differently: Take 5 mg by mouth at  bedtime.  04/01/18  Yes Darylene Price A, FNP  doxazosin (CARDURA) 4 MG tablet Take 4 mg by mouth Nightly. 12/31/16  Yes [provider]  furosemide (LASIX) 80 MG tablet Take 80 mg by mouth daily.   Yes [provider]  glimepiride (AMARYL) 2 MG tablet Take 4 mg by mouth daily. 08/14/16  Yes [provider]  lisinopril (PRINIVIL,ZESTRIL) 40 MG tablet Take 1 tablet (40 mg total) by mouth daily. Patient taking differently: Take 40 mg by mouth at bedtime.  03/27/18  Yes Reino Bellis B, NP  metFORMIN (GLUCOPHAGE) 1000 MG tablet Take 1,000 mg by mouth 2 (two) times daily.   Yes [provider]  pioglitazone (ACTOS) 15 MG tablet Take 15 mg by mouth daily.    Yes [provider]  Potassium 99 MG TABS Take 1.5 tablets by mouth daily.   Yes [provider]  simvastatin (ZOCOR) 20 MG tablet Take 20 mg by mouth Nightly. 12/31/16  Yes [provider]  vitamin B-12 (CYANOCOBALAMIN) 1000 MCG tablet Take 1,000 mcg by mouth daily.   Yes [provider]  warfarin (COUMADIN) 2 MG tablet Take 2 mg by mouth See admin instructions. Take 1 tablet by mouth  Monday-Friday  Take 1/2 tablet by mouth Saturday and Sunday.  Patient takes in the evening.   Yes [provider]      VITAL SIGNS:  Blood pressure (!) 126/58, pulse 60, temperature 98.4 F (36.9 C), temperature source Oral, resp. rate 19, height 5\' 8"  (1.727 m), weight 82.4 kg, SpO2 97 %.  PHYSICAL EXAMINATION:   Physical Exam  GENERAL:  83 y.o.-year-old patient lying in the bed with no acute distress.  EYES: Pupils equal, round, reactive to light and accommodation. No scleral icterus. Extraocular muscles intact.  HEENT: Head atraumatic, normocephalic. Oropharynx and nasopharynx clear.  NECK:  Supple, no jugular venous distention. No thyroid enlargement, no tenderness.  LUNGS: Normal breath sounds bilaterally, no wheezing, rales,rhonchi or crepitation. No use of accessory  muscles of respiration.  CARDIOVASCULAR: S1, S2 normal. No murmurs, rubs, or gallops.  ABDOMEN: Soft, nontender, nondistended. Bowel sounds present. No organomegaly or mass.  EXTREMITIES: No pedal edema, cyanosis, or clubbing. No rash or lesions. + pedal pulses MUSCULOSKELETAL: Normal bulk, and power was 5+ grip and elbow, knee, and ankle flexion and extension bilaterally.  NEUROLOGIC:Alert and oriented x 3. CN 2-12 intact. Sensation to light touch and cold stimuli intact bilaterally.  Gait not tested due to safety concern. PSYCHIATRIC: The patient is alert and oriented x 3.  SKIN: No obvious rash, lesion, or ulcer.   DATA REVIEWED:  LABORATORY PANEL:   CBC Recent Labs  Lab 10/06/18 0736  WBC 7.6  HGB 11.8*  HCT 37.0*  PLT 104*   ------------------------------------------------------------------------------------------------------------------  Chemistries  Recent Labs  Lab 10/06/18 0736  NA 143  K 3.9  CL 104  CO2 25  GLUCOSE 240*  BUN 35*  CREATININE 1.07  CALCIUM 9.3  AST 25  ALT 22  ALKPHOS 68  BILITOT 1.3*   ------------------------------------------------------------------------------------------------------------------  Cardiac Enzymes No results for input(s): TROPONINI in the last 168 hours. ------------------------------------------------------------------------------------------------------------------  RADIOLOGY:  Dg Chest Port 1 View  Result Date: 10/06/2018 CLINICAL DATA:  Fever. EXAM: PORTABLE CHEST 1 VIEW COMPARISON:  Chest x-ray dated March 26, 2018. FINDINGS: Unchanged left chest wall pacemaker. Stable cardiomegaly. Mild pulmonary vascular congestion. No focal consolidation, pleural effusion, or pneumothorax. No acute osseous abnormality. IMPRESSION: 1. Mild pulmonary vascular congestion. Electronically Signed   By: Obie DredgeWilliam T Derry M.D.   On: 10/06/2018 08:06    EKG:  EKG: unchanged from previous tracings, sinus atrial rythm.  Vent. rate 69  BPM PR interval * ms QRS duration 163 ms QT/QTc 550/590 ms P-R-T axes 264 260 88 IMPRESSION AND PLAN:   83 y.o. male with past medical history of diabetes mellitus, chronic diastolic CHF, hypertension, hyperlipidemia, atrial fibrillation on Coumadin, and pacemaker placement presenting to the ED with complaints of " rigors and chills".  1. Sepsis -presenting with fever 100.9 and chills lactic acid 3.9. Source of infection unknown - Admit to MedSurg unit - Chest x-ray shows mild pulmonary vascular congestion - Lactic acid - Covid negative - Check procalcitonin - UA shows no evidence of UTI - Urine cultures pending - Blood cultures pending - Start Empiric abx with cefepime, vancomycin and Flafyl - IVFs and PRN bolus to keep MAP<4865mmHg or SBP <3690mmHg  2. Chronic Diastolic Congestive Heart Failure: - Chest x-ray shows mild pulmonary vascular congestion    Last Echo 06/14/2007 , EF 65% - Continue furosemide 40 mg to start tomorrow, monitor renal function. - Low salt diet  - Check daily weight - Strict I&Os  3. Elevated troponin - no chest pain or dynamic EKG changes. Likely demand ischemia - Trend troponin - Cardiology consult.  4. Paroxysmal atrial fibrillation - Continue Coumadin - Monitor PT/INR  5. HLD + Goal LDL<70 -Simvastatin 20mg  PO qhs   6. HTN- stable + Goal BP <130/80 - Continue lisinopril and amlodipine   7. Diabetes mellitus - Last Hemoglobin A1c 01/2018 6.7 - Holding metformin, glimepiride, and Actos - SSI - Diabetes educator to follow   8. DVT prophylaxis - on coumadin   All the records are reviewed and case discussed with ED provider. Management plans discussed with the patient, family and they are in agreement.  CODE STATUS: FULL  TOTAL TIME TAKING CARE OF THIS PATIENT: 50 minutes.    on 10/06/2018 at 2:16 PM  Webb SilversmithElizabeth Herley Bernardini, DNP, FNP-BC [facility] Nurse Practitioner Between 7am to 6pm - Pager [phone]- [phone]  After 6pm go to  [email or web address] - password [email or web address]  [facility]  Office  [phone]  CC: Primary care physician; Gracelyn NurseJohnston, John D, MD

## 2018-10-06 NOTE — ED Triage Notes (Signed)
C/O shaking all over.  States woke up at 0230 shaking all over.  Has had 2 other episodes of same.

## 2018-10-06 NOTE — Consult Note (Addendum)
ANTICOAGULATION CONSULT NOTE - Initial Consult  Pharmacy Consult for Warfarin Dosing Indication: atrial fibrillation  Allergies  Allergen Reactions  . Methyldopa Nausea Only and Palpitations    Other reaction(s): Unknown Other reaction(s): NAUSEA 'palpitation" Other reaction(s): NAUSEA Other reaction(s): NAUSEA     Patient Measurements: Weight: 176 lb 9.4 oz (80.1 kg) Heparin Dosing Weight: N/A  Vital Signs: Temp: 99.4 F (37.4 C) (09/03 1054) Temp Source: Oral (09/03 1054) BP: 123/57 (09/03 1100) Pulse Rate: 59 (09/03 1100)  Labs: Recent Labs    10/06/18 0736 10/06/18 1039  HGB 11.8*  --   HCT 37.0*  --   PLT 104*  --   APTT 32  --   LABPROT 16.8*  --   INR 1.4*  --   CREATININE 1.07  --   TROPONINIHS 111* 118*    Estimated Creatinine Clearance: 55.9 mL/min (by C-G formula based on SCr of 1.07 mg/dL).   Medical History: Past Medical History:  Diagnosis Date  . CHF (congestive heart failure) ([place])   . Diabetes mellitus without complication ([place])   . Essential hypertension   . Mixed hyperlipidemia   . Permanent atrial fibrillation    a. s/p catheter ablation ~ 12 yrs ago @ [facility] per family->unsuccessful; b. CHA2DS2VASc = 5-->chronic coumadin.    Medications:  (Not in a hospital admission)  Scheduled:  . amLODipine  5 mg Oral QHS  . doxazosin  4 mg Oral Nightly  . [START ON 10/07/2018] furosemide  40 mg Oral Daily  . lisinopril  40 mg Oral QHS  . simvastatin  20 mg Oral Nightly  . warfarin  4 mg Oral ONCE-1800  . Warfarin - Pharmacist Dosing Inpatient   Does not apply q1800   Infusions:   PRN: acetaminophen **OR** acetaminophen Anti-infectives (From admission, onward)   Start     Dose/Rate Route Frequency Ordered Stop   10/06/18 0830  vancomycin (VANCOCIN) IVPB 1000 mg/200 mL premix     1,000 mg 200 mL/hr over 60 Minutes Intravenous  Once 10/06/18 0815 10/06/18 1013   10/06/18 0830  piperacillin-tazobactam (ZOSYN) IVPB 3.375 g     3.375  g 100 mL/hr over 30 Minutes Intravenous  Once 10/06/18 0815 10/06/18 6789      Assessment: Pharmacy has been consulted for Warfarin dosing in 83yo patient with history of Afib. Patient states last dose taken was 2mg  tablet@ approximately 1800. Baseline labs have been ordered and obtained. Patient's home regimen is: 2mg  M-F, and 1mg  Saturday and Sunday.  9/3: INR 1.4  Goal of Therapy:  INR 2-3 Monitor platelets by anticoagulation protocol: Yes   Plan:  Patient's INR is currently subtherapeutic. Will order Warfarin 4mg  PO x 1 dose. Patient's platelets are at 104. Per previous history, platelet levels tend to run at or below 100. Discussed with hospitalist, will continue to monitor levels daily.  Will recheck INR with AM labs.  Rachit Grim A Angeles Paolucci 10/06/2018,11:40 AM

## 2018-10-06 NOTE — ED Notes (Signed)
Date and time results received: 10/06/18 09:45 (use smartphrase ".now" to insert current time)  Test: Lactic Critical Value: 4.2  Name of Provider Notified: Dr. Jimmye Norman  Orders Received? Or Actions Taken?: Orders received

## 2018-10-06 NOTE — ED Provider Notes (Addendum)
[facility] Emergency Department Provider Note       Time seen: ----------------------------------------- 7:15 AM on 10/06/2018 -----------------------------------------   I have reviewed the triage vital signs and the nursing notes.  HISTORY   Chief Complaint Shaking   HPI Andrew [name] is a 83 y.o. male with a history of CHF, diabetes, hypertension, hyperlipidemia, atrial fibrillation who presents to the ED for shaking.  Family brings him in for shaking all over, woke up at 230 with this.  Has had episodes of this in the past.  Temperature was 100.0 here on arrival, he is actively vomiting on arrival.  Otherwise denies chest pain, difficulty breathing, cough, diarrhea.  Past Medical History:  Diagnosis Date  . CHF (congestive heart failure) ([facility])   . Diabetes mellitus without complication ([facility])   . Essential hypertension   . Mixed hyperlipidemia   . Permanent atrial fibrillation    a. s/p catheter ablation ~ 12 yrs ago @ [facility] per family->unsuccessful; b. CHA2DS2VASc = 5-->chronic coumadin.    Patient Active Problem List   Diagnosis Date Noted  . Chronic diastolic (congestive) heart failure (HCC) 04/01/2018  . HTN (hypertension) 04/01/2018  . DM (diabetes mellitus) (HCC) 04/01/2018  . Paroxysmal atrial fibrillation (HCC) 04/01/2018  . Symptomatic bradycardia 03/25/2018  . Acute CHF (congestive heart failure) (HCC) 03/23/2018    Past Surgical History:  Procedure Laterality Date  . APPENDECTOMY    . INSERT / REPLACE / REMOVE PACEMAKER    . PACEMAKER IMPLANT N/A 03/25/2018   Procedure: PACEMAKER IMPLANT;  Surgeon: Hillis Range, MD;  Location: [facility] INVASIVE CV LAB;  Service: Cardiovascular;  Laterality: N/A;    Allergies Methyldopa  Social History Social History   Tobacco Use  . Smoking status: Former Smoker    Packs/day: 1.50    Years: 35.00    Pack years: 52.50  . Smokeless tobacco: Never Used  Substance Use Topics  . Alcohol  use: Yes    Comment: 1.5 beers a month  . Drug use: Never   Review of Systems Constitutional: Positive for fever, rigors Cardiovascular: Negative for chest pain. Respiratory: Negative for shortness of breath. Gastrointestinal: Negative for abdominal pain, positive for vomiting Musculoskeletal: Negative for [name] pain. Skin: Negative for rash. Neurological: Negative for headaches, focal weakness or numbness.  All systems negative/normal/unremarkable except as stated in the HPI  ____________________________________________   PHYSICAL EXAM:  VITAL SIGNS: ED Triage Vitals  Enc Vitals Group     BP 10/06/18 0703 (!) 148/73     Pulse Rate 10/06/18 0703 68     Resp 10/06/18 0703 16     Temp 10/06/18 0703 100 F (37.8 C)     Temp Source 10/06/18 0703 Oral     SpO2 10/06/18 0703 97 %     Weight 10/06/18 0704 176 lb 9.4 oz (80.1 kg)     Height --      Head Circumference --      Peak Flow --      Pain Score 10/06/18 0704 0     Pain Loc --      Pain Edu? --      Excl. in GC? --    Constitutional: Alert and oriented.  Mild distress Eyes: Conjunctivae are normal. Normal extraocular movements. Cardiovascular: Normal rate, regular rhythm. No murmurs, rubs, or gallops. Respiratory: Normal respiratory effort without tachypnea nor retractions. Breath sounds are clear and equal bilaterally. No wheezes/rales/rhonchi. Gastrointestinal: Soft and nontender. Normal bowel sounds Musculoskeletal: Nontender with normal range of  motion in extremities. No lower extremity tenderness nor edema. Neurologic:  Normal speech and language. No gross focal neurologic deficits are appreciated.  Skin:  Skin is warm, dry and intact. No rash noted. Psychiatric: Mood and affect are normal. Speech and behavior are normal.  ____________________________________________  EKG: Interpreted by me.  Ventricular paced rhythm with a rate of 63 bpm  Repeat EKG interpreted by me, ventricular paced rhythm with a rate of 69  bpm, no acute interval change  ____________________________________________  ED COURSE:  As part of my medical decision making, I reviewed the following data within the [facility] History obtained from family if available, nursing notes, old chart and ekg, as well as notes from prior ED visits. Patient presented for Rigors, we will assess with labs and imaging as indicated at this time. Clinical Course as of Oct 05 936  Thu Oct 06, 2018  0821 Presumed sepsis, however cannot give full fluid challenge due to CHF   [JW]    Clinical Course User Index [JW] Earleen Newport, MD   Procedures  Andrew [name] was evaluated in Emergency Department on 10/06/2018 for the symptoms described in the history of present illness. He was evaluated in the context of the global COVID-19 pandemic, which necessitated consideration that the patient might be at risk for infection with the SARS-CoV-2 virus that causes COVID-19. Institutional protocols and algorithms that pertain to the evaluation of patients at risk for COVID-19 are in a state of rapid change based on information released by regulatory bodies including the CDC and federal and state organizations. These policies and algorithms were followed during the patient's care in the ED.  ____________________________________________   LABS (pertinent positives/negatives)  Labs Reviewed  LACTIC ACID, PLASMA - Abnormal; Notable for the following components:      Result Value   Lactic Acid, Venous 3.9 (*)    All other components within normal limits  COMPREHENSIVE METABOLIC PANEL - Abnormal; Notable for the following components:   Glucose, Bld 240 (*)    BUN 35 (*)    Total Bilirubin 1.3 (*)    All other components within normal limits  CBC WITH DIFFERENTIAL/PLATELET - Abnormal; Notable for the following components:   RBC 3.76 (*)    Hemoglobin 11.8 (*)    HCT 37.0 (*)    Platelets 104 (*)    Lymphs Abs 0.2 (*)    All other  components within normal limits  PROTIME-INR - Abnormal; Notable for the following components:   Prothrombin Time 16.8 (*)    INR 1.4 (*)    All other components within normal limits  URINALYSIS, ROUTINE W REFLEX MICROSCOPIC - Abnormal; Notable for the following components:   Color, Urine YELLOW (*)    APPearance HAZY (*)    Ketones, ur 5 (*)    Protein, ur 30 (*)    All other components within normal limits  SARS CORONAVIRUS 2 (HOSPITAL ORDER, [place] [facility])  CULTURE, BLOOD (ROUTINE X 2)  CULTURE, BLOOD (ROUTINE X 2)  URINE CULTURE  APTT  LACTIC ACID, PLASMA    RADIOLOGY Images were viewed by me  Chest x-ray IMPRESSION: 1. Mild pulmonary vascular congestion. ____________________________________________   DIFFERENTIAL DIAGNOSIS   Sepsis, dehydration, electrolyte abnormality, gastroenteritis, coronavirus  FINAL ASSESSMENT AND PLAN  Fever, rigor's, vomiting, lactic acidosis   Plan: The patient had presented for vomiting and rigors. Patient's labs revealed some hyperglycemia as well as a lactic acid level of 3.9.  The  remainder of his labs look normal.  Patient's imaging not reveal any acute process.  No clear etiology for possible sepsis at this time.  He was given gentle fluids as well as broad-spectrum antibiotics.  I will recommend observation.   Ulice DashJohnathan E Drewey Begue, MD    Note: This note was generated in part or whole with voice recognition software. Voice recognition is usually quite accurate but there are transcription errors that can and very often do occur. I apologize for any typographical errors that were not detected and corrected.     Emily FilbertWilliams, Leotha Westermeyer E, MD 10/06/18 [id number]    Emily FilbertWilliams, Destine Ambroise E, MD 10/06/18 [id number]    Emily FilbertWilliams, Shakera Ebrahimi E, MD 10/06/18 1034

## 2018-10-06 NOTE — Progress Notes (Addendum)
Reviewed labs this evening, noted troponin continues to trend up. Patient remains asymptomatic with no complains of chest pain per primary nurse Yasmin Soriano. Repeat troponin ordered informed primary nurse to notify on call provider with new complaints of chest pain and if Troponin continues to trend up. Cardiology already consulted.  Rufina Falco, DNP, CCRN, SCRN, FNP-BC [facility] Nurse Practitioner Between 7am to 6pm - Pager [phone]  After 6pm go to [email or web address] - Proofreader  [employer]  [phone]

## 2018-10-06 NOTE — Consult Note (Signed)
Pharmacy Antibiotic Note  Andrew [name] is a 83 y.o. male admitted on 10/06/2018 with sepsis.  Pharmacy has been consulted for cefepime and vancomycin dosing.  Plan: Cefepime 2g q12H   Pt received vancomycin 1000 mg x 1. Will given another 1000 mg x 1 for a total 2000 mg loading dose. Will order vancomycin 1500 mg q24H as maintenance dose. Predict AUC 528. Goal AUC 400-550. Scr used 1.07. Css min 12.6.   Height: 5\' 8"  (172.7 cm) Weight: 181 lb 11.2 oz (82.4 kg) IBW/kg (Calculated) : 68.4  Temp (24hrs), Avg:100 F (37.8 C), Min:98.4 F (36.9 C), Max:101.1 F (38.4 C)  Recent Labs  Lab 10/06/18 0736 10/06/18 0911  WBC 7.6  --   CREATININE 1.07  --   LATICACIDVEN 3.9* 4.2*    Estimated Creatinine Clearance: 56.7 mL/min (by C-G formula based on SCr of 1.07 mg/dL).    Allergies  Allergen Reactions  . Methyldopa Nausea Only and Palpitations    Other reaction(s): Unknown Other reaction(s): NAUSEA 'palpitation" Other reaction(s): NAUSEA Other reaction(s): NAUSEA     Antimicrobials this admission: 9/3 cefepime >>  9/3 vancomycin >>   Dose adjustments this admission: None  Microbiology results: 9/3 BCx: pending 9/3 UCx: pending   Thank you for allowing pharmacy to be a part of this patient's care.  Oswald Hillock, PharmD, BCPS  10/06/2018 1:51 PM

## 2018-10-07 ENCOUNTER — Inpatient Hospital Stay: Payer: Medicare HMO

## 2018-10-07 DIAGNOSIS — I4821 Permanent atrial fibrillation: Secondary | ICD-10-CM

## 2018-10-07 DIAGNOSIS — R6889 Other general symptoms and signs: Secondary | ICD-10-CM

## 2018-10-07 DIAGNOSIS — I248 Other forms of acute ischemic heart disease: Secondary | ICD-10-CM

## 2018-10-07 DIAGNOSIS — R509 Fever, unspecified: Secondary | ICD-10-CM

## 2018-10-07 LAB — BASIC METABOLIC PANEL
Anion gap: 11 (ref 5–15)
BUN: 38 mg/dL — ABNORMAL HIGH (ref 8–23)
CO2: 22 mmol/L (ref 22–32)
Calcium: 8.8 mg/dL — ABNORMAL LOW (ref 8.9–10.3)
Chloride: 107 mmol/L (ref 98–111)
Creatinine, Ser: 1.03 mg/dL (ref 0.61–1.24)
GFR calc Af Amer: >60 mL/min (ref >60)
GFR calc non Af Amer: >60 mL/min (ref >60)
Glucose, Bld: 101 mg/dL — ABNORMAL HIGH (ref 70–99)
Potassium: 3.5 mmol/L (ref 3.5–5.1)
Sodium: 140 mmol/L (ref 135–145)

## 2018-10-07 LAB — LACTIC ACID, PLASMA: Lactic Acid, Venous: 1.2 mmol/L (ref 0.5–1.9)

## 2018-10-07 LAB — CBC
HCT: 32.6 % — ABNORMAL LOW (ref 39.0–52.0)
Hemoglobin: 10.7 g/dL — ABNORMAL LOW (ref 13.0–17.0)
MCH: 31.7 pg (ref 26.0–34.0)
MCHC: 32.8 g/dL (ref 30.0–36.0)
MCV: 96.4 fL (ref 80.0–100.0)
Platelets: 85 10*3/uL — ABNORMAL LOW (ref 150–400)
RBC: 3.38 MIL/uL — ABNORMAL LOW (ref 4.22–5.81)
RDW: 14.8 % (ref 11.5–15.5)
WBC: 4.3 10*3/uL (ref 4.0–10.5)
nRBC: 0 % (ref 0.0–0.2)

## 2018-10-07 LAB — C DIFFICILE QUICK SCREEN W PCR REFLEX
C Diff antigen: NEGATIVE
C Diff interpretation: NOT DETECTED
C Diff toxin: NEGATIVE

## 2018-10-07 LAB — URINE CULTURE

## 2018-10-07 LAB — PROTIME-INR
INR: 1.9 — ABNORMAL HIGH (ref 0.8–1.2)
Prothrombin Time: 21.8 seconds — ABNORMAL HIGH (ref 11.4–15.2)

## 2018-10-07 LAB — BRAIN NATRIURETIC PEPTIDE: B Natriuretic Peptide: 281 pg/mL — ABNORMAL HIGH (ref 0.0–100.0)

## 2018-10-07 LAB — PROCALCITONIN: Procalcitonin: 19.83 ng/mL

## 2018-10-07 LAB — MAGNESIUM: Magnesium: 1.9 mg/dL (ref 1.7–2.4)

## 2018-10-07 LAB — HEPARIN LEVEL (UNFRACTIONATED): Heparin Unfractionated: <0.1 IU/mL — ABNORMAL LOW (ref 0.30–0.70)

## 2018-10-07 MED ORDER — SODIUM CHLORIDE 0.9 % IV SOLN
INTRAVENOUS | Status: DC | PRN
  Administered 2018-10-07 – 2018-10-08 (×3): 500 mL via INTRAVENOUS

## 2018-10-07 MED ORDER — SODIUM CHLORIDE 0.9% FLUSH: 3.0000 mL | Freq: Two times a day (BID) | INTRAVENOUS | Status: DC

## 2018-10-07 MED ORDER — SODIUM CHLORIDE 0.9 % IV SOLN
500.0000 mg | INTRAVENOUS | Status: DC
  Administered 2018-10-07 – 2018-10-08 (×2): 500 mg via INTRAVENOUS
  Filled 2018-10-07 (×4): qty 500

## 2018-10-07 MED ORDER — WARFARIN SODIUM 1 MG PO TABS
1.5000 mg | ORAL_TABLET | Freq: Once | ORAL | Status: DC
  Filled 2018-10-07: qty 1

## 2018-10-07 MED ORDER — SODIUM CHLORIDE 0.9% FLUSH
3.0000 mL | Freq: Two times a day (BID) | INTRAVENOUS | Status: DC
  Administered 2018-10-07 – 2018-10-08 (×2): 3 mL via INTRAVENOUS

## 2018-10-07 MED ORDER — WARFARIN SODIUM 2 MG PO TABS
2.0000 mg | ORAL_TABLET | Freq: Once | ORAL | Status: AC
  Administered 2018-10-07: 2 mg via ORAL
  Filled 2018-10-07: qty 1

## 2018-10-07 MED ORDER — SODIUM CHLORIDE 0.9% FLUSH: 3.0000 mL | INTRAVENOUS | Status: DC | PRN

## 2018-10-07 MED ORDER — POTASSIUM CHLORIDE CRYS ER 20 MEQ PO TBCR
40.0000 meq | EXTENDED_RELEASE_TABLET | Freq: Two times a day (BID) | ORAL | Status: AC
  Administered 2018-10-07 (×2): 40 meq via ORAL
  Filled 2018-10-07 (×2): qty 2

## 2018-10-07 MED ORDER — LOPERAMIDE HCL 2 MG PO CAPS
2.0000 mg | ORAL_CAPSULE | ORAL | Status: DC | PRN
  Administered 2018-10-07 – 2018-10-08 (×2): 2 mg via ORAL
  Filled 2018-10-07 (×2): qty 1

## 2018-10-07 MED ORDER — SODIUM CHLORIDE 0.9 % IV SOLN
1.0000 g | INTRAVENOUS | Status: DC
  Administered 2018-10-07: 1 g via INTRAVENOUS
  Filled 2018-10-07: qty 10
  Filled 2018-10-07: qty 1

## 2018-10-07 NOTE — Consult Note (Addendum)
ANTICOAGULATION CONSULT NOTE   Pharmacy Consult for Warfarin Dosing Indication: atrial fibrillation  Allergies  Allergen Reactions  . Methyldopa Nausea Only and Palpitations    Other reaction(s): Unknown Other reaction(s): NAUSEA 'palpitation" Other reaction(s): NAUSEA Other reaction(s): NAUSEA     Patient Measurements: Height: 5\' 8"  (172.7 cm) Weight: 183 lb 11.2 oz (83.3 kg) IBW/kg (Calculated) : 68.4 Heparin Dosing Weight: N/A  Vital Signs: Temp: 98.6 F (37 C) (09/04 0723) Temp Source: Oral (09/04 0723) BP: 114/51 (09/04 0723) Pulse Rate: 59 (09/04 0723)  Labs: Recent Labs    10/06/18 0736 10/06/18 1039 10/06/18 2148 10/06/18 2244 10/06/18 2246 10/07/18 0800  HGB 11.8*  --   --   --  9.8* 10.7*  HCT 37.0*  --   --   --  29.7* 32.6*  PLT 104*  --   --   --  88* 85*  APTT 32  --   --  41*  --   --   LABPROT 16.8*  --   --  19.0*  --  21.8*  INR 1.4*  --   --  1.6*  --  1.9*  CREATININE 1.07  --   --   --   --   --   TROPONINIHS 111* 118* 117*  --   --   --     Estimated Creatinine Clearance: 57 mL/min (by C-G formula based on SCr of 1.07 mg/dL).   Medical History: Past Medical History:  Diagnosis Date  . CHF (congestive heart failure) (H[facility])   . Diabetes mellitus without complication (H[facility])   . Essential hypertension   . Mixed hyperlipidemia   . Permanent atrial fibrillation    a. s/p catheter ablation ~ 12 yrs ago @ [facility] per family->unsuccessful; b. CHA2DS2VASc = 5-->chronic coumadin.    Medications:  Medications Prior to Admission  Medication Sig Dispense Refill Last Dose  . amLODipine (NORVASC) 5 MG tablet Take 1 tablet (5 mg total) by mouth daily. (Patient taking differently: Take 5 mg by mouth at bedtime. ) 90 tablet 3 10/05/2018 at 2000  . doxazosin (CARDURA) 4 MG tablet Take 4 mg by mouth Nightly.   10/05/2018 at 2000  . furosemide (LASIX) 80 MG tablet Take 80 mg by mouth daily.   10/06/2018 at 0500  . glimepiride (AMARYL) 2 MG tablet Take 4 mg  by mouth daily.   10/06/2018 at 0500  . lisinopril (PRINIVIL,ZESTRIL) 40 MG tablet Take 1 tablet (40 mg total) by mouth daily. (Patient taking differently: Take 40 mg by mouth at bedtime. ) 30 tablet 1 10/05/2018 at 2000  . metFORMIN (GLUCOPHAGE) 1000 MG tablet Take 1,000 mg by mouth 2 (two) times daily.   10/06/2018 at 0500  . pioglitazone (ACTOS) 15 MG tablet Take 15 mg by mouth daily.    10/06/2018 at 0500  . Potassium 99 MG TABS Take 1.5 tablets by mouth daily.   10/06/2018 at 0500  . simvastatin (ZOCOR) 20 MG tablet Take 20 mg by mouth Nightly.   10/05/2018 at 2000  . vitamin B-12 (CYANOCOBALAMIN) 1000 MCG tablet Take 1,000 mcg by mouth daily.   10/06/2018 at 0500  . warfarin (COUMADIN) 2 MG tablet Take 2 mg by mouth See admin instructions. Take 1 tablet by mouth  Monday-Friday  Take 1/2 tablet by mouth Saturday and Sunday.  Patient takes in the evening.   10/05/2018 at 1800   Scheduled:  . amLODipine  5 mg Oral QHS  . doxazosin  4 mg Oral Nightly  .  furosemide  40 mg Oral Daily  . lisinopril  40 mg Oral QHS  . simvastatin  20 mg Oral Nightly  . Warfarin - Pharmacist Dosing Inpatient   Does not apply q1800   Infusions:  . ceFEPime (MAXIPIME) IV 2 g (10/07/18 0526)  . heparin 1,000 Units/hr (10/07/18 0600)  . metronidazole 500 mg (10/07/18 0658)  . vancomycin     PRN: acetaminophen **OR** acetaminophen, loperamide Anti-infectives (From admission, onward)   Start     Dose/Rate Route Frequency Ordered Stop   10/07/18 1000  vancomycin (VANCOCIN) 1,500 mg in sodium chloride 0.9 % 500 mL IVPB     1,500 mg 250 mL/hr over 120 Minutes Intravenous Every 24 hours 10/06/18 1402     10/06/18 1800  ceFEPIme (MAXIPIME) 2 g in sodium chloride 0.9 % 100 mL IVPB     2 g 200 mL/hr over 30 Minutes Intravenous Every 12 hours 10/06/18 1343     10/06/18 1400  metroNIDAZOLE (FLAGYL) IVPB 500 mg     500 mg 100 mL/hr over 60 Minutes Intravenous Every 8 hours 10/06/18 1343     10/06/18 1400  vancomycin (VANCOCIN)  IVPB 1000 mg/200 mL premix     1,000 mg 200 mL/hr over 60 Minutes Intravenous  Once 10/06/18 1343 10/06/18 1627   10/06/18 0830  vancomycin (VANCOCIN) IVPB 1000 mg/200 mL premix     1,000 mg 200 mL/hr over 60 Minutes Intravenous  Once 10/06/18 0815 10/06/18 1013   10/06/18 0830  piperacillin-tazobactam (ZOSYN) IVPB 3.375 g     3.375 g 100 mL/hr over 30 Minutes Intravenous  Once 10/06/18 0815 10/06/18 3419      Assessment: Pharmacy has been consulted for Warfarin dosing in 83yo patient with history of Afib. Patient states last dose taken was 2mg  tablet@ approximately 1800. Baseline labs have been ordered and obtained. Patient's home regimen is:  M-F 2mg , and Saturday and Sunday 1mg .  Drug interactions w/Warfarin:   Azithromycin - may increase INR   Metronidazole- will significantly increase INR. While patient is on therapy may need to dose adjust. Given patient is on metronidazole, will likely resume home regimen for today. Update: medication has been discontinued.   Today's INR: Subtherapeutic (s/p dose increase x1), but is a significant increase. Patient continues to have thrombocytopenia, though this appears to be his baseline. Confirmed with nurse there are no concerns for bleeding. Will likely need to discuss today's CBC  with MD. Additionally, patient was also on heparin but the infusion was stopped this morning as there was no indication.      Date INR Dose 9/3 1.4 Warfarin 4mg   9/3 1.6(PM) 9/4 1.9   Goal of Therapy:  INR 2-3 Monitor platelets by anticoagulation protocol: Yes   Plan:  Will order Warfarin 2mg  PO x 1 dose.  MD aware of platelet count. Per cards, patient to continue medical management with warfarin.   Will check INR daily while on antibiotic and CBC every 3 days.   Rowland Lathe 10/07/2018,8:48 AM

## 2018-10-07 NOTE — Progress Notes (Addendum)
[place] for Heparin Indication: chest pain/ACS  Allergies  Allergen Reactions  . Methyldopa Nausea Only and Palpitations    Other reaction(s): Unknown Other reaction(s): NAUSEA 'palpitation" Other reaction(s): NAUSEA Other reaction(s): NAUSEA    Patient Measurements: Height: 5\' 8"  (172.7 cm) Weight: 183 lb 11.2 oz (83.3 kg) IBW/kg (Calculated) : 68.4 HEPARIN DW (KG): 82.4  Vital Signs: Temp: 98.6 F (37 C) (09/04 0723) Temp Source: Oral (09/04 0723) BP: 114/51 (09/04 0723) Pulse Rate: 59 (09/04 0723)  Labs: Recent Labs    10/06/18 0736 10/06/18 1039 10/06/18 2148 10/06/18 2244 10/06/18 2246 10/07/18 0800  HGB 11.8*  --   --   --  9.8* 10.7*  HCT 37.0*  --   --   --  29.7* 32.6*  PLT 104*  --   --   --  88* 85*  APTT 32  --   --  41*  --   --   LABPROT 16.8*  --   --  19.0*  --  21.8*  INR 1.4*  --   --  1.6*  --  1.9*  HEPARINUNFRC  --   --   --   --   --  <0.10*  CREATININE 1.07  --   --   --   --   --   TROPONINIHS 111* 118* 117*  --   --   --     Estimated Creatinine Clearance: 57 mL/min (by C-G formula based on SCr of 1.07 mg/dL).   Medical History: Past Medical History:  Diagnosis Date  . CHF (congestive heart failure) ([place])   . Diabetes mellitus without complication ([place])   . Essential hypertension   . Mixed hyperlipidemia   . Permanent atrial fibrillation    a. s/p catheter ablation ~ 12 yrs ago @ [facility] per family->unsuccessful; b. CHA2DS2VASc = 5-->chronic coumadin.    Medications:  Scheduled:  . amLODipine  5 mg Oral QHS  . doxazosin  4 mg Oral Nightly  . furosemide  40 mg Oral Daily  . lisinopril  40 mg Oral QHS  . simvastatin  20 mg Oral Nightly  . Warfarin - Pharmacist Dosing Inpatient   Does not apply q1800   Assessment: Pharmacy asked to initiate Heparin for ACS.  Pt received Warfarin 4mg  po x 1 today at 5170. Platelets low.  INR 1.4 this morning >> 1.6 tonight.  9/4 HL 0800: < 0.10.  Subtherapeutic. Confirmed with nurse no heparin interruptions. Presently, there is an active warfarin consult for atrial fibrillation.Will need to discuss utility of both anticoagulants.    Goal of Therapy:  Heparin level 0.3-0.7 units/ml Monitor platelets by anticoagulation protocol: Yes   Plan:  Discussed with MD about heparin and warfarin consults, will delay adjusting heparin infusion until further notified by MD.   Addendum: heparin discontinued   Rowland Lathe 10/07/2018,10:12 AM

## 2018-10-07 NOTE — Progress Notes (Addendum)
[facility] [place] at [facility]   PATIENT NAME: Andrew [name]    MR#:  [id number]  DATE OF BIRTH:  1935-04-22  SUBJECTIVE:   Patient states he is feeling better this morning.  He wants to go home.  He states he is having diarrhea, but this is a chronic issue for him.  He did have one episode of vomiting yesterday.  He denies any chest pain or shortness of breath.  He endorses a mild cough.  REVIEW OF SYSTEMS:  Review of Systems  Constitutional: Negative for chills and fever.  HENT: Negative for congestion and sore throat.   Eyes: Negative for blurred vision and double vision.  Respiratory: Positive for cough. Negative for shortness of breath.   Cardiovascular: Negative for chest pain and palpitations.  Gastrointestinal: Positive for diarrhea, nausea and vomiting. Negative for abdominal pain, blood in stool, constipation and melena.  Genitourinary: Negative for dysuria and urgency.  Musculoskeletal: Negative for back pain and neck pain.  Neurological: Negative for dizziness and headaches.  Psychiatric/Behavioral: Negative for depression. The patient is not nervous/anxious.     DRUG ALLERGIES:   Allergies  Allergen Reactions  . Methyldopa Nausea Only and Palpitations    Other reaction(s): Unknown Other reaction(s): NAUSEA 'palpitation" Other reaction(s): NAUSEA Other reaction(s): NAUSEA    VITALS:  Blood pressure (!) 114/51, pulse (!) 59, temperature 98.6 F (37 C), temperature source Oral, resp. rate 19, height 5\' 8"  (1.727 m), weight 83.3 kg, SpO2 98 %. PHYSICAL EXAMINATION:  Physical Exam  GENERAL:  Laying in the bed with no acute distress.  HEENT: Head atraumatic, normocephalic. Pupils equal, round, reactive to light and accommodation. No scleral icterus. Extraocular muscles intact. Oropharynx and nasopharynx clear.  NECK:  Supple, no jugular venous distention. No thyroid enlargement. LUNGS: + Faint right basilar crackles present.  Lungs  otherwise clear.  Normal work of breathing.  Able to speak in full sentences. CARDIOVASCULAR: RRR, S1, S2 normal. No murmurs, rubs, or gallops.  ABDOMEN: Soft, nontender, nondistended. Bowel sounds present.  EXTREMITIES: No pedal edema, cyanosis, or clubbing.  NEUROLOGIC: CN 2-12 intact, no focal deficits. 5/5 muscle strength throughout all extremities. Sensation intact throughout. Gait not checked.  PSYCHIATRIC: The patient is alert and oriented x 3.  SKIN: No obvious rash, lesion, or ulcer.  LABORATORY PANEL:  Male CBC Recent Labs  Lab 10/07/18 0800  WBC 4.3  HGB 10.7*  HCT 32.6*  PLT 85*   ------------------------------------------------------------------------------------------------------------------ Chemistries  Recent Labs  Lab 10/06/18 0736 10/07/18 0800 10/07/18 1227  NA 143 140  --   K 3.9 3.5  --   CL 104 107  --   CO2 25 22  --   GLUCOSE 240* 101*  --   BUN 35* 38*  --   CREATININE 1.07 1.03  --   CALCIUM 9.3 8.8*  --   MG  --   --  1.9  AST 25  --   --   ALT 22  --   --   ALKPHOS 68  --   --   BILITOT 1.3*  --   --    RADIOLOGY:  Dg Chest 2 View  Result Date: 10/07/2018 CLINICAL DATA:  Fever. EXAM: CHEST - 2 VIEW COMPARISON:  10/06/2018 FINDINGS: A single lead pacemaker remains in place. The cardiac silhouette remains mildly enlarged. There is slight elevation of the right hemidiaphragm with mild right basilar lung opacity and suspected small right pleural effusion. The left lung is clear. No pneumothorax  is identified. No acute osseous abnormality is seen. IMPRESSION: Mild right basilar opacity likely reflecting atelectasis and a small pleural effusion. Electronically Signed   By: Logan Bores M.D.   On: 10/07/2018 09:08   ASSESSMENT AND PLAN:   Sepsis- likely secondary to community-acquired pneumonia.  Meeting sepsis criteria on admission with fever, tachypnea, and lactic acidosis. Sepsis has resolved. -Repeat two-view chest x-ray this morning with new  right basilar opacity -Procalcitonin is elevated, will repeat tomorrow -RVP ordered -Lactic acidosis has resolved -Change antibiotics to ceftriaxone and azithromycin -Follow-up blood and urine cultures  Dysphagia- patient has had a couple of choking episodes at home. Concern that his pneumonia could possibly be aspiration pneumonia, but procalcitonin is elevated. -SLP consult  Chronic diarrhea -C. difficile negative -GI pathogen panel pending -Imodium as needed  Chronic diastolic congestive heart failure-no signs of volume overload.  BNP mildly elevated, but this may be his baseline. -Restart home Lasix tomorrow -Continue home lisinopril -PCP should consider changing home pioglitazone to another agent  Elevated troponin- likely demand ischemia in the setting of sepsis -Stop heparin drip -Cardiology following  Paroxysmal atrial fibrillation- in NSR here -Coumadin per pharmacy  Hyperlipidemia -Continue home statin  Hypertension- BP normal -Continue lisinopril and amlodipine  Type 2 diabetes -Continue SSI -PCP should consider changing pioglitazone to another agent, given his heart failure  DVT prophylaxis - on coumadin  Wife at bedside, updated on the plan.  Likely discharge home tomorrow.  All the records are reviewed and case discussed with Care Management/Social Worker. Management plans discussed with the patient, family and they are in agreement.  CODE STATUS: Full Code  TOTAL TIME TAKING CARE OF THIS PATIENT: 40 minutes.   More than 50% of the time was spent in counseling/coordination of care: YES  POSSIBLE D/C tomorrow, DEPENDING ON CLINICAL CONDITION.   Berna Spare Kadeisha Betsch M.D on 10/07/2018 at 2:54 PM  Between 7am to 6pm - Pager [phone]  After 6pm go to [email or web address] - Proofreader  [facility]  Office  [phone]  CC: Primary care physician; Baxter Hire, MD  Note: This dictation was prepared with Dragon  dictation along with smaller phrase technology. Any transcriptional errors that result from this process are unintentional.

## 2018-10-07 NOTE — Evaluation (Addendum)
Clinical/Bedside Swallow Evaluation Patient Details  Name: STAR CORLESS MRN: [id number] Date of Birth: 1935-11-13  Today's Date: 10/07/2018 Time: SLP Start Time (ACUTE ONLY): 1500 SLP Stop Time (ACUTE ONLY): 1600 SLP Time Calculation (min) (ACUTE ONLY): 60 min  Past Medical History:  Past Medical History:  Diagnosis Date  . CHF (congestive heart failure) (HCC)   . Diabetes mellitus without complication (HCC)   . Essential hypertension   . Mixed hyperlipidemia   . Permanent atrial fibrillation    a. s/p catheter ablation ~ 12 yrs ago @ [facility] per family->unsuccessful; b. CHA2DS2VASc = 5-->chronic coumadin.   Past Surgical History:  Past Surgical History:  Procedure Laterality Date  . APPENDECTOMY    . INSERT / REPLACE / REMOVE PACEMAKER    . PACEMAKER IMPLANT N/A 03/25/2018   Procedure: PACEMAKER IMPLANT;  Surgeon: Hillis Range, MD;  Location: [facility] INVASIVE CV LAB;  Service: Cardiovascular;  Laterality: N/A;   HPI:  Pt is an 83 y.o male with past medical history of diabetes mellitus, chronic diastolic CHF, hypertension, hyperlipidemia, atrial fibrillation on Coumadin, and pacemaker placement presenting to the ED with complaints of " rigors and chills with low grade fever. Was diagnosed with sepsis clinically but source not yet found. Started on antibiotics pending results of cultures. Per family report, he has experienced this before. N/V at admission noted per chart. Covid 19 test Negative per chart.    Assessment / Plan / Recommendation Clinical Impression  Pt appears to present w/ a fairly functional oropharyngeal phase swallow w/ no immediate, overt s/s of aspiration when following general aspiration precautions. However, w/ many trials given, pt exhibited a Belching. Per pt and Wife present, pt also described eating "fast and not chewing my food well b/f swallowing the chunks whole sometimes". He endorsed sometimes he is in a "hurry" to he takes large bites quickly. W/ ANY  Esophageal dysmotility or Reflux behavior, retrograde and/or regurgitated material occur w/in the Esophagus and can increase risk for aspiration of such thus have a negative impact on the Pulmonary status. Pt has a declined Pulmonary status at baseline - easily SOB w/ ANY exertion including talking.  Pt did require assistance in positioning upright in bed for po trials. He fed himself after given setup. He consumed trials of thin liquids Via Cup and soft solids/purees w/ no immediate, overt s/s of aspiration noted; clear vocal quality and no decline in respiratory status from his baseline appreciated (pt is easily SOB w/ the exertion of talking, moving about). Rest breaks were given/recommended d/t pt's overall Pulmonary status. Oral phase appeared grossly [facility] for bolus management, mastication, and A-P transfer for swallowing. He achieved oral clearing b/t bites but alos used a sip of liquid to aid oral clearing. Pt fed self w/ setup. OM exam appeared wfl. Straws were not utilized during the remainder of the eval. Belching occurred during oral intake but no c/o globus feelings.  Recommend more Mech Soft foods in the diet for easier intake w/ thin liquids; general aspiration and REFLUX precautions as pt has baseline c/o and description of REFLUX. Recommend Pills in Puree as needed for safer, easier swallowing. Recommend more Mech Soft foods/diet w/ purees added in hopes to ease Esophageal motility thus reduce dysmotility. Wife present; education and information given.  SLP Visit Diagnosis: Dysphagia, pharyngoesophageal phase (R13.14)    Aspiration Risk  Mild aspiration risk(from Esophageal dysmotility, Reflux)    Diet Recommendation  Mech soft diet/foods, Thin liquids via CUP (no Straws). General aspiration precautions;  REFLUX precautions.  Medication Administration: Whole meds with puree(as indicated)    Other  Recommendations Recommended Consults: Consider GI evaluation;Consider esophageal  assessment(Dietician f/u) Oral Care Recommendations: Oral care BID;Patient independent with oral care   Follow up Recommendations None      Frequency and Duration min 1 x/week  1 week       Prognosis Prognosis for Safe Diet Advancement: Fair(-Good) Barriers to Reach Goals: Time post onset;Severity of deficits;Behavior      Swallow Study   General Date of Onset: 10/06/18 HPI: Pt is an 83 y.o male with past medical history of diabetes mellitus, chronic diastolic CHF, hypertension, hyperlipidemia, atrial fibrillation on Coumadin, and pacemaker placement presenting to the ED with complaints of " rigors and chills with low grade fever. Was diagnosed with sepsis clinically but source not yet found. Started on antibiotics pending results of cultures. Per family report, he has experienced this before. N/V at admission noted per chart. Covid 19 test Negative per chart.  Type of Study: Bedside Swallow Evaluation Previous Swallow Assessment: none  Diet Prior to this Study: Regular;Thin liquids Temperature Spikes Noted: No(wbc 4.3) Respiratory Status: Room air History of Recent Intubation: No Behavior/Cognition: Alert;Cooperative;Pleasant mood;Distractible;Requires cueing Oral Cavity Assessment: Dry Oral Care Completed by SLP: Recent completion by staff Oral Cavity - Dentition: (Dentures) Vision: Functional for self-feeding Self-Feeding Abilities: Able to feed self;Needs assist;Needs set up Patient Positioning: Upright in bed(needed cues for positioning) Baseline Vocal Quality: Normal Volitional Cough: Strong Volitional Swallow: Able to elicit    Oral/Motor/Sensory Function Overall Oral Motor/Sensory Function: Within functional limits   Ice Chips Ice chips: Not tested   Thin Liquid Thin Liquid: Within functional limits Presentation: Cup;Self Fed(~4 ozs) Other Comments: when using a straw initially, he demonstrated coughing x1/2 trials    Nectar Thick Nectar Thick Liquid: Not tested    Honey Thick Honey Thick Liquid: Not tested   Puree Puree: Within functional limits Presentation: Self Fed;Spoon(4 ozs)   Solid     Solid: Within functional limits Presentation: Self Fed;Spoon(5 trials) Other Comments: moistened food well       Orinda Kenner, MS, CCC-SLP Watson,Katherine 10/07/2018,5:11 PM

## 2018-10-07 NOTE — Consult Note (Signed)
Cardiology Consultation:   Patient ID: Andrew [name] MRN: [id number]; DOB: 03/21/1935  Admit date: 10/06/2018 Date of Consult: 10/07/2018  Primary Care Provider: Gracelyn NurseJohnston, John D, MD Primary Cardiologist: Yvonne Kendallhristopher End, MD  Primary Electrophysiologist:  None    Patient Profile:   Andrew [name] is a 83 y.o. male with a hx of permanent Afib s/p St Jude PPM with RV lead (03/2018 implantation, MRI model PM1272/ serial number [id number]) for CHB and Afib, HTN, HLD, CHF, DM2, and remote tobacco abuse who is being seen today for the evaluation of minimally elevated troponin at the request of Webb SilversmithElizabeth Ouma, NP.  History of Present Illness:   Andrew [name] is an [age] yo male with PMH as above. [place] and (per family) s/p catheter ablation at [facility] for Afib ~10-`2 years earlier. To the family's knowledge, this was unsuccessful with ERAF after procedure then permanent Afib since that time. He was then chronically anticoagulated on warfarin with INR monitored per PCP and was lost to follow-up.   In 03/2018, he was admitted to [facility] ED per PCP due to chronic cough, progressive dyspnea and bradycardia (rate of 35) / slow Afib / CHB. During the hospitalization, he was diuresed with improved breathing status; however, despite holding all AV nodal blocking agents, he remained bradycardic in the mid 30's with fairly regular ventricular response. BP was hypertensive. He was then transferred [facility] and underwent 2/21 PPM [facility] Medical Tendril MRI model [id number]  (serial number [id number]) right ventricular lead [facility] Jude Medical Assurity MRI model [id number] (serial number [id number]) pacemaker. Right ventricular lead R-waves measured 10 mV with an impedance of 784 ohms and a threshold of 1.1 V at 0.5 msec.  He was seen virtually at follow-up 07/01/2018 due to COVID-19 restrictions. He was reportedly doing very well and significantly  improved s/p pacemaker implantation. He denied palpitations, CP, SOB, LEE, dizziness, presyncope, or syncope.   On 9/3, he presented to [facility] with c/o rigors or "shaking all over, which woke him at 2:30AM." In the ED, he was febrile with T 100.61F and had documented active emesis. No CP, SOB, cough, or diarrhea. EKG showed V paced rhythm at 63bpm and no acute changes. Troponin minimally elevated and flat trending with peak at 118.  Lactic acid 3.9. Glucose 240. Hgb 11.8.   Heart Pathway Score:     Past Medical History:  Diagnosis Date  . CHF (congestive heart failure) (HCC)   . Diabetes mellitus without complication (HCC)   . Essential hypertension   . Mixed hyperlipidemia   . Permanent atrial fibrillation    a. s/p catheter ablation ~ 12 yrs ago @ [facility] per family->unsuccessful; b. CHA2DS2VASc = 5-->chronic coumadin.    Past Surgical History:  Procedure Laterality Date  . APPENDECTOMY    . INSERT / REPLACE / REMOVE PACEMAKER    . PACEMAKER IMPLANT N/A 03/25/2018   Procedure: PACEMAKER IMPLANT;  Surgeon: Hillis RangeAllred, James, MD;  Location: [facility] INVASIVE CV LAB;  Service: Cardiovascular;  Laterality: N/A;     Home Medications:  Prior to Admission medications   Medication Sig Start Date End Date Taking? Authorizing Provider  amLODipine (NORVASC) 5 MG tablet Take 1 tablet (5 mg total) by mouth daily. Patient taking differently: Take 5 mg by mouth at bedtime.  04/01/18  Yes Clarisa KindredHackney, Tina A, FNP  doxazosin (CARDURA) 4 MG tablet Take 4 mg by mouth Nightly. 12/31/16  Yes [provider]  furosemide (LASIX) 80 MG tablet Take 80 mg by mouth daily.   Yes [provider]  glimepiride (AMARYL) 2 MG tablet Take 4 mg by mouth daily. 08/14/16  Yes [provider]  lisinopril (PRINIVIL,ZESTRIL) 40 MG tablet Take 1 tablet (40 mg total) by mouth daily. Patient taking differently: Take 40 mg by mouth at bedtime.  03/27/18  Yes Laverda Pageoberts, Lindsay B, NP  metFORMIN (GLUCOPHAGE) 1000 MG  tablet Take 1,000 mg by mouth 2 (two) times daily.   Yes [provider]  pioglitazone (ACTOS) 15 MG tablet Take 15 mg by mouth daily.    Yes [provider]  Potassium 99 MG TABS Take 1.5 tablets by mouth daily.   Yes [provider]  simvastatin (ZOCOR) 20 MG tablet Take 20 mg by mouth Nightly. 12/31/16  Yes [provider]  vitamin B-12 (CYANOCOBALAMIN) 1000 MCG tablet Take 1,000 mcg by mouth daily.   Yes [provider]  warfarin (COUMADIN) 2 MG tablet Take 2 mg by mouth See admin instructions. Take 1 tablet by mouth  Monday-Friday  Take 1/2 tablet by mouth Saturday and Sunday.  Patient takes in the evening.   Yes [provider]    Inpatient Medications: Scheduled Meds: . amLODipine  5 mg Oral QHS  . doxazosin  4 mg Oral Nightly  . furosemide  40 mg Oral Daily  . lisinopril  40 mg Oral QHS  . simvastatin  20 mg Oral Nightly  . Warfarin - Pharmacist Dosing Inpatient   Does not apply q1800   Continuous Infusions: . ceFEPime (MAXIPIME) IV 2 g (10/07/18 0526)  . heparin 1,000 Units/hr (10/07/18 0600)  . metronidazole 500 mg (10/07/18 0658)  . vancomycin 1,500 mg (10/07/18 0900)   PRN Meds: acetaminophen **OR** acetaminophen, loperamide  Allergies:    Allergies  Allergen Reactions  . Methyldopa Nausea Only and Palpitations    Other reaction(s): Unknown Other reaction(s): NAUSEA 'palpitation" Other reaction(s): NAUSEA Other reaction(s): NAUSEA     Social History:   Social History   Socioeconomic History  . Marital status: Married    Spouse name: Not on file  . Number of children: Not on file  . Years of education: Not on file  . Highest education level: Not on file  Occupational History  . Not on file  Social Needs  . Financial resource strain: Not on file  . Food insecurity    Worry: Not on file    Inability: Not on file  . Transportation needs    Medical: Not on file    Non-medical: Not on file  Tobacco  Use  . Smoking status: Former Smoker    Packs/day: 1.50    Years: 35.00    Pack years: 52.50  . Smokeless tobacco: Never Used  Substance and Sexual Activity  . Alcohol use: Yes    Comment: 1.5 beers a month  . Drug use: Never  . Sexual activity: Not on file  Lifestyle  . Physical activity    Days per week: Not on file    Minutes per session: Not on file  . Stress: Not on file  Relationships  . Social Musicianconnections    Talks on phone: Not on file    Gets together: Not on file    Attends religious service: Not on file    Active member of club or organization: Not on file    Attends meetings of clubs or organizations: Not on file    Relationship status: Not on  file  . Intimate partner violence    Fear of current or ex partner: Not on file    Emotionally abused: Not on file    Physically abused: Not on file    Forced sexual activity: Not on file  Other Topics Concern  . Not on file  Social History Narrative   Lives locally with wife.  Dtr lives nearby and helps out.  Does not routinely exercise.    Family History:    Family History  Problem Relation Age of Onset  . CVA Mother   . Bladder Cancer Father      ROS:  Please see the history of present illness.  Review of Systems  Constitutional: Positive for chills and fever.  Respiratory: Negative for shortness of breath.   Cardiovascular: Negative for chest pain.  Gastrointestinal: Positive for abdominal pain, nausea and vomiting.  All other systems reviewed and are negative.   All other ROS reviewed and negative.     Physical Exam/Data:   Vitals:   10/06/18 2045 10/07/18 0319 10/07/18 0322 10/07/18 0723  BP: (!) 106/59  (!) 112/53 (!) 114/51  Pulse: (!) 57  61 (!) 59  Resp:   18 19  Temp:   98.6 F (37 C) 98.6 F (37 C)  TempSrc:   Oral Oral  SpO2: 95%  98% 98%  Weight:  83.3 kg    Height:        Intake/Output Summary (Last 24 hours) at 10/07/2018 1004 Last data filed at 10/07/2018 0600 Gross per 24 hour   Intake 897.77 ml  Output 100 ml  Net 797.77 ml   Last 3 Weights 10/07/2018 10/06/2018 10/06/2018  Weight (lbs) 183 lb 11.2 oz 181 lb 11.2 oz 176 lb 9.4 oz  Weight (kg) 83.326 kg 82.419 kg 80.1 kg     Body mass index is 27.93 kg/m.  General:  Elderly male, NAD HEENT: normal Neck: no JVD Vascular: radial pulses 1+ bilaterally Cardiac:  normal S1, S2; bradycardic and IRIR (s/p RV lead PPM); no murmur  Lungs:  clear to auscultation bilaterally, no wheezing, rhonchi or rales  Abd: soft, nontender, no hepatomegaly  Ext: no edema Musculoskeletal:  No deformities, BUE and BLE strength normal and equal Skin: warm and dry  Neuro:  No focal abnormalities noted Psych:  Normal affect   EKG:  The EKG was personally reviewed and demonstrates:  Afib, Ventricular rate 69bpm, RBBB (known), no acute changes (note s/p PPM, RV lead) Telemetry:  Telemetry was personally reviewed and demonstrates:  Afib, Vpaced  Relevant CV Studies: 03/2018 Echo  1. The left ventricle has low normal systolic function, with an ejection fraction of 50-55%. The cavity size was mild to moderately dilated. Grossly no regional wall motion abnormalities. Left ventricular diastolic Doppler parameters are indeterminate.  2. The right ventricle has mildly reduced systolic function. The cavity was moderately enlarged. There is no increase in right ventricular wall thickness. Right ventricular systolic pressure is mild to moderately elevated with an estimated pressure of  44.8 mmHg.  3. Left atrial size was moderately dilated.  4. Right atrial size was mildly dilated.  5. Mitral valve regurgitation is mild to moderate by color flow Doppler.  6. The inferior vena cava was dilated in size with >50% respiratory variability.  7. Bradycardia noted, rate 34 bpm  Laboratory Data:  High Sensitivity Troponin:   Recent Labs  Lab 10/06/18 0736 10/06/18 1039 10/06/18 2148  TROPONINIHS 111* 118* 117*     Cardiac EnzymesNo results for  input(s): TROPONINI in the last 168 hours. No results for input(s): TROPIPOC in the last 168 hours.  Chemistry Recent Labs  Lab 10/06/18 0736  NA 143  K 3.9  CL 104  CO2 25  GLUCOSE 240*  BUN 35*  CREATININE 1.07  CALCIUM 9.3  GFRNONAA >60  GFRAA >60  ANIONGAP 14    Recent Labs  Lab 10/06/18 0736  PROT 6.6  ALBUMIN 3.9  AST 25  ALT 22  ALKPHOS 68  BILITOT 1.3*   Hematology Recent Labs  Lab 10/06/18 0736 10/06/18 2246 10/07/18 0800  WBC 7.6 5.7 4.3  RBC 3.76* 3.09* 3.38*  HGB 11.8* 9.8* 10.7*  HCT 37.0* 29.7* 32.6*  MCV 98.4 96.1 96.4  MCH 31.4 31.7 31.7  MCHC 31.9 33.0 32.8  RDW 14.3 14.6 14.8  PLT 104* 88* 85*   BNPNo results for input(s): BNP, PROBNP in the last 168 hours.  DDimer No results for input(s): DDIMER in the last 168 hours.   Radiology/Studies:  Dg Chest 2 View  Result Date: 10/07/2018 CLINICAL DATA:  Fever. EXAM: CHEST - 2 VIEW COMPARISON:  10/06/2018 FINDINGS: A single lead pacemaker remains in place. The cardiac silhouette remains mildly enlarged. There is slight elevation of the right hemidiaphragm with mild right basilar lung opacity and suspected small right pleural effusion. The left lung is clear. No pneumothorax is identified. No acute osseous abnormality is seen. IMPRESSION: Mild right basilar opacity likely reflecting atelectasis and a small pleural effusion. Electronically Signed   By: Logan Bores M.D.   On: 10/07/2018 09:08   Dg Chest Port 1 View  Result Date: 10/06/2018 CLINICAL DATA:  Fever. EXAM: PORTABLE CHEST 1 VIEW COMPARISON:  Chest x-ray dated March 26, 2018. FINDINGS: Unchanged left chest wall pacemaker. Stable cardiomegaly. Mild pulmonary vascular congestion. No focal consolidation, pleural effusion, or pneumothorax. No acute osseous abnormality. IMPRESSION: 1. Mild pulmonary vascular congestion. Electronically Signed   By: Titus Dubin M.D.   On: 10/06/2018 08:06    Assessment and Plan:   Elevated HS Tn, Supply  demand ischemia --No CP. --Troponin minimally elevated, flat trending, and peaked at 118, now down-trending. --EKG without acute changes. --Likely supply demand ischemia in the setting of illness / sepsis with fever and rigors, emesis. --No indication for heparin.  --No indication for emergent ischemic workup at this time. With recovery and in the future once stable (priority on treating infection), could consider further ischemic workup / stress given risk factors and as no previous eval.  Sepsis --Continue abx. --Will continue to monitor cultures, due to fever and sepsis with new device 2/21.  Chronic HFpEF  --Not volume overloaded on exam. Echo as above. EF normal. Received IV bolus in the ED. +1L for admission with stable wt 82.4kg  83.3kg. --Carefully monitor Cr/BUN given elevated from baseline and on vancomycin with low pressures. --Daily BMET. No BMET yet today, ordered with results back and BUN elevated. --Ordered BNP. --Discontinued lasix given not volume overloaded on exam and hypotensive with current infection/sepsis. Continue medical management as tolerated by renal function and BP.  Hypokalemia --K 3.5. Replete with goal 4.0. --Check Mg with goal 2.0. --Ordered KCl 56mEq x2 for two total doses.  Essential HTN --Controlled and somewhat soft to hypotensive. Continue medical management as tolerated by BP.  PPM, H/o permanent Afib with CHB --As above in HPI with implantation 2/21 d/t CHB and Afib.  CHA2DS2VASc = 5-->chronic coumadin. Continue medical management with Warfarin. Needs follow-up with Dr. Caryl Comes in [place] scheduled before  discharge.   Anemia --Closely monitor with Hgb 10.7. Recommend workup with sudden drops. Transfuse below 8.0.  DM2 --SSI, per IM.  HLD --Continue medical management as tolerated.   For questions or updates, please contact [facility] Please consult [email or web address] for contact info under     Signed, Lennon Alstrom, PA-C   10/07/2018 10:04 AM

## 2018-10-07 NOTE — Consult Note (Addendum)
Pharmacy Antibiotic Note  Andrew [name] is a 83 y.o. male admitted on 10/06/2018 with sepsis.  Pharmacy has been consulted for cefepime and vancomycin dosing.  Baseline Scr (03/26/2018): 0.82  Plan: Cefepime 2g q12H   Will order vancomycin 1500 mg q24H as maintenance dose. Will recheck creatinine with AM labs.  Predicted AUC 528. Goal AUC 400-550. Scr used 1.07. Css min 12.6.   Height: 5\' 8"  (172.7 cm) Weight: 183 lb 11.2 oz (83.3 kg) IBW/kg (Calculated) : 68.4  Temp (24hrs), Avg:98.9 F (37.2 C), Min:98.4 F (36.9 C), Max:99.4 F (37.4 C)  Recent Labs  Lab 10/06/18 0736 10/06/18 0911 10/06/18 2244 10/06/18 2246 10/07/18 0800  WBC 7.6  --   --  5.7 4.3  CREATININE 1.07  --   --   --   --   LATICACIDVEN 3.9* 4.2* 2.7*  --   --     Estimated Creatinine Clearance: 57 mL/min (by C-G formula based on SCr of 1.07 mg/dL).    Allergies  Allergen Reactions  . Methyldopa Nausea Only and Palpitations    Other reaction(s): Unknown Other reaction(s): NAUSEA 'palpitation" Other reaction(s): NAUSEA Other reaction(s): NAUSEA     Antimicrobials this admission: 9/3 cefepime >>  9/3 vancomycin >>   Dose adjustments this admission: None  Microbiology results: 9/3 BCx: pending 9/3 UCx: pending    Addendum: Cefepime and Vancomycin have been discontinued   Thank you for allowing pharmacy to be a part of this patient's care.  Rowland Lathe, PharmD  10/07/2018 9:06 AM

## 2018-10-08 LAB — GASTROINTESTINAL PANEL BY PCR, STOOL (REPLACES STOOL CULTURE)

## 2018-10-08 LAB — BASIC METABOLIC PANEL
Anion gap: 8 (ref 5–15)
BUN: 30 mg/dL — ABNORMAL HIGH (ref 8–23)
CO2: 24 mmol/L (ref 22–32)
Calcium: 8.8 mg/dL — ABNORMAL LOW (ref 8.9–10.3)
Chloride: 110 mmol/L (ref 98–111)
Creatinine, Ser: 0.99 mg/dL (ref 0.61–1.24)
GFR calc Af Amer: >60 mL/min (ref >60)
GFR calc non Af Amer: >60 mL/min (ref >60)
Glucose, Bld: 277 mg/dL — ABNORMAL HIGH (ref 70–99)
Potassium: 4.3 mmol/L (ref 3.5–5.1)
Sodium: 142 mmol/L (ref 135–145)

## 2018-10-08 LAB — RESPIRATORY PANEL BY PCR

## 2018-10-08 LAB — CBC
HCT: 31.1 % — ABNORMAL LOW (ref 39.0–52.0)
Hemoglobin: 10.2 g/dL — ABNORMAL LOW (ref 13.0–17.0)
MCH: 32 pg (ref 26.0–34.0)
MCHC: 32.8 g/dL (ref 30.0–36.0)
MCV: 97.5 fL (ref 80.0–100.0)
Platelets: 78 10*3/uL — ABNORMAL LOW (ref 150–400)
RBC: 3.19 MIL/uL — ABNORMAL LOW (ref 4.22–5.81)
RDW: 14.7 % (ref 11.5–15.5)
WBC: 4.4 10*3/uL (ref 4.0–10.5)
nRBC: 0 % (ref 0.0–0.2)

## 2018-10-08 LAB — PROCALCITONIN: Procalcitonin: 9.71 ng/mL

## 2018-10-08 LAB — PROTIME-INR
INR: 1.9 — ABNORMAL HIGH (ref 0.8–1.2)
Prothrombin Time: 21.2 seconds — ABNORMAL HIGH (ref 11.4–15.2)

## 2018-10-08 MED ORDER — WARFARIN SODIUM 2 MG PO TABS
2.0000 mg | ORAL_TABLET | Freq: Once | ORAL | Status: DC
  Filled 2018-10-08: qty 1

## 2018-10-08 MED ORDER — DOXYCYCLINE MONOHYDRATE 100 MG PO CAPS: 100.0000 mg | ORAL_CAPSULE | Freq: Two times a day (BID) | ORAL | 0 refills | Status: DC

## 2018-10-08 NOTE — Progress Notes (Signed)
  Speech Language Pathology Treatment: Dysphagia  Patient Details Name: Andrew [name] MRN: [id number] DOB: December 25, 1935 Today's Date: 10/08/2018 Time: 0850-0920 SLP Time Calculation (min) (ACUTE ONLY): 30 min  Assessment / Plan / Recommendation Clinical Impression  Pt seen for ongoing assessment of toleration of oral diet; follow through w/ aspiration precautions recommended to lessen risk for aspiration. Pt continues to present w/ quick SOB w/ ANY exertion including talking. Discussed this and the need to lessen talking during eating/drinking and to take Rest Breaks during meals to lessen fatigue and WOB. Pt agreed verbally but needed min cues for follow through.     HPI HPI: Pt is an 83 y.o male with past medical history of diabetes mellitus, chronic diastolic CHF, hypertension, hyperlipidemia, atrial fibrillation on Coumadin, and pacemaker placement presenting to the ED with complaints of " rigors and chills with low grade fever. Was diagnosed with sepsis clinically but source not yet found. Started on antibiotics pending results of cultures. Per family report, he has experienced this before. N/V at admission noted per chart. Covid 19 test Negative per chart.       SLP Plan  Continue with current plan of care       Recommendations  Diet recommendations: Dysphagia 3 (mechanical soft);Thin liquid Liquids provided via: Cup;No straw Medication Administration: Whole meds with puree(for safer swallowing) Supervision: Patient able to self feed;Intermittent supervision to cue for compensatory strategies(for cues) Compensations: Minimize environmental distractions;Slow rate;Small sips/bites;Lingual sweep for clearance of pocketing;Multiple dry swallows after each bite/sip;Follow solids with liquid Postural Changes and/or Swallow Maneuvers: Seated upright 90 degrees;Upright 30-60 min after meal                General recommendations: (Dietician f/u) Oral Care Recommendations: Oral care  BID;Patient independent with oral care Follow up Recommendations: None SLP Visit Diagnosis: Dysphagia, pharyngoesophageal phase (R13.14) Plan: Continue with current plan of care       [place], [place], CCC-SLP Sallie Staron 10/08/2018, 12:01 PM

## 2018-10-08 NOTE — Evaluation (Signed)
Physical Therapy Evaluation Patient Details Name: Andrew [name] MRN: [id number] DOB: 1935-10-12 Today's Date: 10/08/2018   History of Present Illness  presented to ER secondary to rigors/chills; admitted for management of sepsis related to CAP.  Clinical Impression  Upon evaluation, patient alert and oriented; follows commands, eager for OOB mobility and anticipated discharge home.  Bilat UE/LE strength and ROM grossly symmetrical and WFL; no focal weakness appreciated.  Standing balance impaired, as evidenced by absent functional reach outside immediate BOS without contralateral UE support for stabilization.  Demonstrates ability to complete bed mobility with mod indep; sit/stand, basic transfers and gait (75') without assist device, min assist.  Narrowed BOS, inconsistent foot placement with excessive weight shift to R; min assist for balance/safety throughout.  Did trial RW for additional gait trial (200'), improving to cga/min assist with use of assist device.  Does require cuing for slower, controlled gait performance (minimizing external distraction during gait).  Mod SOB with exertion; sats >93-95% at rest and with exertion throughout session.  Verbally reviewed fall risk, safety needs and recommendations for RW use at all times; discussed activity pacing, enenergy conservation and signs/symptoms of fatigue.  Patient/wife voiced understanding of all information.Report access to RW at home; agreeable to use upon discharge. Would benefit from skilled PT to address above deficits and promote optimal return to PLOF; Recommend transition to [place] upon discharge from acute hospitalization.      Follow Up Recommendations Home health PT    Equipment Recommendations  (has RW at discharge)    Recommendations for Other Services       Precautions / Restrictions Precautions Precautions: Fall;ICD/Pacemaker Restrictions Weight Bearing Restrictions: No      Mobility  Bed Mobility Overal bed  mobility: Modified Independent                Transfers Overall transfer level: Needs assistance Equipment used: Rolling walker (2 wheeled);None Transfers: Sit to/from Stand Sit to Stand: Min guard;Min assist         General transfer comment: without RW, min assist; wiht RW, cga/min assist.  Cuing for hand placement. Mild increased sway in A/P plane, cga/min assist for balance recovery.  Ambulation/Gait Ambulation/Gait assistance: Min assist Gait Distance (Feet): 75 Feet Assistive device: None       General Gait Details: narrowed BOS with inconsistent foot placement, excessive R lateral weight shift; min assist for balance throughout.  Intermittently reaching for walls/furniture for extenral stabilization. Poor insight into balance deficits and safety needs.  Stairs            Wheelchair Mobility    Modified Rankin (Stroke Patients Only)       Balance Overall balance assessment: Needs assistance Sitting-balance support: No upper extremity supported;Feet supported Sitting balance-Leahy Scale: Good     Standing balance support: No upper extremity supported Standing balance-Leahy Scale: Poor Standing balance comment: cga/min assist for safety; absent functional reach outside immediate BOS without contralateral UE support for stabilization                             Pertinent Vitals/Pain Pain Assessment: No/denies pain    Home Living Family/patient expects to be discharged to:: Private residence Living Arrangements: Spouse/significant other Available Help at Discharge: Family;Available 24 hours/day(daughter also lives next door)   Home Access: Level entry     Home Layout: One level Home Equipment: None      Prior Function Level of Independence: Independent  Comments: Indep with ADLs, household and community mobilization; does use SPC for longer, community distances.  Denies fall history. No home O2.     Hand Dominance         Extremity/Trunk Assessment   Upper Extremity Assessment Upper Extremity Assessment: Overall WFL for tasks assessed    Lower Extremity Assessment Lower Extremity Assessment: Overall WFL for tasks assessed(grossly at least 4/5 bilat; no focal weakness noted)       Communication   Communication: No difficulties  Cognition Arousal/Alertness: Awake/alert Behavior During Therapy: WFL for tasks assessed/performed Overall Cognitive Status: Within Functional Limits for tasks assessed                                        General Comments      Exercises Other Exercises Other Exercises: 200' with RW, cga--still slightly inconsistent with foot placement, intermittently crossing LEs (esp with head turns); encouraged to slow cadence an [place] gait speed for improved focus on balance and safety.  Adheres to recommendations, but requires frequent encouragement throughout. Mod SOB With exertion, sats >93-95% on RA. Other Exercises: Verbally reviewed fall risk, safety needs and recommendations for RW use at all times; discussed activity pacing, enenergy conservation and signs/symptoms of fatigue.  Patient/wife voiced understanding of all information.   Assessment/Plan    PT Assessment Patient needs continued PT services  PT Problem List Decreased strength;Decreased activity tolerance;Decreased balance;Decreased mobility;Cardiopulmonary status limiting activity;Decreased safety awareness       PT Treatment Interventions DME instruction;Gait training;Stair training;Functional mobility training;Therapeutic activities;Therapeutic exercise;Balance training;Patient/family education    PT Goals (Current goals can be found in the Care Plan section)  Acute Rehab PT Goals Patient Stated Goal: to return home PT Goal Formulation: With patient/family Time For Goal Achievement: 10/22/18 Potential to Achieve Goals: Good Additional Goals Additional Goal #1: Indep use of energy  conservation and activity pacing strategies for safety/indep with functional activities.    Frequency Min 2X/week   Barriers to discharge        Co-evaluation               AM-PAC PT "6 Clicks" Mobility  Outcome Measure Help needed turning from your back to your side while in a flat bed without using bedrails?: None Help needed moving from lying on your back to sitting on the side of a flat bed without using bedrails?: None Help needed moving to and from a bed to a chair (including a wheelchair)?: A Little Help needed standing up from a chair using your arms (e.g., wheelchair or bedside chair)?: A Little Help needed to walk in hospital room?: A Little Help needed climbing 3-5 steps with a railing? : A Little 6 Click Score: 20    End of Session Equipment Utilized During Treatment: Gait belt Activity Tolerance: Patient tolerated treatment well Patient left: in bed;with bed alarm set;with chair alarm set;with family/visitor present Nurse Communication: Mobility status PT Visit Diagnosis: Muscle weakness (generalized) (M62.81);Difficulty in walking, not elsewhere classified (R26.2)    Time: 9038-3338 PT Time Calculation (min) (ACUTE ONLY): 31 min   Charges:   PT Evaluation $PT Eval Moderate Complexity: 1 Mod PT Treatments $Gait Training: 8-22 mins        Rashi Giuliani H. Manson Passey, PT, DPT, NCS 10/08/18, 2:22 PM [phone]

## 2018-10-08 NOTE — Discharge Summary (Signed)
[facility] [place] NAME: Andrew [name]    MR#:  [id number]  DATE OF BIRTH:  10/03/1935  DATE OF ADMISSION:  10/06/2018   ADMITTING PHYSICIAN: Lang Snow, NP  DATE OF DISCHARGE: 10/08/18  PRIMARY CARE PHYSICIAN: Baxter Hire, MD   ADMISSION DIAGNOSIS:  Lactic acidosis [E87.2] Fever, unspecified fever cause [R50.9] DISCHARGE DIAGNOSIS:  Active Problems:   Sepsis ([place])  SECONDARY DIAGNOSIS:   Past Medical History:  Diagnosis Date  . CHF (congestive heart failure) ([place])   . Diabetes mellitus without complication ([place])   . Essential hypertension   . Mixed hyperlipidemia   . Permanent atrial fibrillation    a. s/p catheter ablation ~ 12 yrs ago @ [facility] per family->unsuccessful; b. CHA2DS2VASc = 5-->chronic coumadin.   HOSPITAL COURSE:   Andrew [name] is an 83 year old male who presented to the ED with fever. In the ED, he was meeting sepsis criteria with fever, tachypnea, and lactic acidosis. He was started on broad spectrum antibiotics. He was admitted for further management.  Sepsis- likely secondary to community-acquired pneumonia. Sepsis has resolved. -Repeat chest x-ray showed with new right basilar opacity -Procalcitonin was elevated, but trended down -RVP was negative -Lactic acidosis has resolved -Initially treated with ceftriaxone and azithromycin and then discharged home on doxycycline for a total 5 day course of abx -Blood and urine cultures were negative -Home health was ordered on discharge  Dysphagia- patient has had a couple of choking episodes at home. Concern that his pneumonia could possibly be aspiration pneumonia, but procalcitonin is elevated. -SLP consult  Chronic diarrhea -C. difficile negative -GI pathogen panel pending -Imodium as needed  Chronic diastolic congestive heart failure- no signs of volume overload.  BNP mildly elevated, but this may be his baseline. -Home lasix restarted on  discharge -Continued home lisinopril -PCP should consider changing home pioglitazone to another agent  Paroxysmal atrial fibrillation- in NSR here -Coumadin was continued  Hyperlipidemia -Continued home statin  Hypertension- BP normal -Continued lisinopril and amlodipine  Type 2 diabetes -Continue SSI -PCP should consider changing pioglitazone to another agent, given his heart failure  DISCHARGE CONDITIONS:  CAP Dysphagia Chronic diarrhea Chronic diastolic CHF Paroxysmal atrial fibrillation Hyperlipidemia Hypertension Type 2 diabetes CONSULTS OBTAINED:  Cardiology  DRUG ALLERGIES:   Allergies  Allergen Reactions  . Methyldopa Nausea Only and Palpitations    Other reaction(s): Unknown Other reaction(s): NAUSEA 'palpitation" Other reaction(s): NAUSEA Other reaction(s): NAUSEA    DISCHARGE MEDICATIONS:   Allergies as of 10/08/2018      Reactions   Methyldopa Nausea Only, Palpitations   Other reaction(s): Unknown Other reaction(s): NAUSEA 'palpitation" Other reaction(s): NAUSEA Other reaction(s): NAUSEA      Medication List    TAKE these medications   amLODipine 5 MG tablet Commonly known as: NORVASC Take 1 tablet (5 mg total) by mouth daily. What changed: when to take this   doxazosin 4 MG tablet Commonly known as: CARDURA Take 4 mg by mouth Nightly.   doxycycline 100 MG capsule Commonly known as: MONODOX Take 1 capsule (100 mg total) by mouth 2 (two) times daily.   furosemide 80 MG tablet Commonly known as: LASIX Take 80 mg by mouth daily.   glimepiride 2 MG tablet Commonly known as: AMARYL Take 4 mg by mouth daily.   lisinopril 40 MG tablet Commonly known as: ZESTRIL Take 1 tablet (40 mg total) by mouth daily. What changed: when to take this   metFORMIN 1000  MG tablet Commonly known as: GLUCOPHAGE Take 1,000 mg by mouth 2 (two) times daily.   pioglitazone 15 MG tablet Commonly known as: ACTOS Take 15 mg by mouth daily.    Potassium 99 MG Tabs Take 1.5 tablets by mouth daily.   simvastatin 20 MG tablet Commonly known as: ZOCOR Take 20 mg by mouth Nightly.   vitamin B-12 1000 MCG tablet Commonly known as: CYANOCOBALAMIN Take 1,000 mcg by mouth daily.   warfarin 2 MG tablet Commonly known as: COUMADIN Take 2 mg by mouth See admin instructions. Take 1 tablet by mouth  Monday-Friday  Take 1/2 tablet by mouth Saturday and Sunday.  Patient takes in the evening.        DISCHARGE INSTRUCTIONS:  1. F/u with PCP in 5 days 2. Take doxycycline as prescribed to complete a 5 day course DIET:  Cardiac diet and Diabetic diet DISCHARGE CONDITION:  Stable ACTIVITY:  Activity as tolerated OXYGEN:  Home Oxygen: No.  Oxygen Delivery: room air DISCHARGE LOCATION:  home   If you experience worsening of your admission symptoms, develop shortness of breath, life threatening emergency, suicidal or homicidal thoughts you must seek medical attention immediately by calling 911 or calling your MD immediately  if symptoms less severe.  You Must read complete instructions/literature along with all the possible adverse reactions/side effects for all the Medicines you take and that have been prescribed to you. Take any new Medicines after you have completely understood and accpet all the possible adverse reactions/side effects.   Please note  You were cared for by a hospitalist during your hospital stay. If you have any questions about your discharge medications or the care you received while you were in the hospital after you are discharged, you can call the unit and asked to speak with the hospitalist on call if the hospitalist that took care of you is not available. Once you are discharged, your primary care physician will handle any further medical issues. Please note that NO REFILLS for any discharge medications will be authorized once you are discharged, as it is imperative that you return to your primary care physician  (or establish a relationship with a primary care physician if you do not have one) for your aftercare needs so that they can reassess your need for medications and monitor your lab values.    On the day of Discharge:  VITAL SIGNS:  Blood pressure (!) 144/70, pulse 61, temperature 98.4 F (36.9 C), resp. rate (!) 22, height 5\' 8"  (1.727 m), weight 83.6 kg, SpO2 91 %. PHYSICAL EXAMINATION:  GENERAL:  83 y.o.-year-old patient lying in the bed with no acute distress.  EYES: Pupils equal, round, reactive to light and accommodation. No scleral icterus. Extraocular muscles intact.  HEENT: Head atraumatic, normocephalic. Oropharynx and nasopharynx clear.  NECK:  Supple, no jugular venous distention. No thyroid enlargement, no tenderness.  LUNGS: Normal breath sounds bilaterally, no wheezing, rales,rhonchi or crepitation. No use of accessory muscles of respiration.  CARDIOVASCULAR: S1, S2 normal. No murmurs, rubs, or gallops.  ABDOMEN: Soft, non-tender, non-distended. Bowel sounds present. No organomegaly or mass.  EXTREMITIES: No pedal edema, cyanosis, or clubbing.  NEUROLOGIC: Cranial nerves II through XII are intact. Muscle strength 5/5 in all extremities. Sensation intact. Gait not checked.  PSYCHIATRIC: The patient is alert and oriented x 3.  SKIN: No obvious rash, lesion, or ulcer.  DATA REVIEW:   CBC Recent Labs  Lab 10/08/18 0802  WBC 4.4  HGB 10.2*  HCT 31.1*  PLT 78*    Chemistries  Recent Labs  Lab 10/06/18 0736  10/07/18 1227 10/08/18 0802  NA 143   < >  --  142  K 3.9   < >  --  4.3  CL 104   < >  --  110  CO2 25   < >  --  24  GLUCOSE 240*   < >  --  277*  BUN 35*   < >  --  30*  CREATININE 1.07   < >  --  0.99  CALCIUM 9.3   < >  --  8.8*  MG  --   --  1.9  --   AST 25  --   --   --   ALT 22  --   --   --   ALKPHOS 68  --   --   --   BILITOT 1.3*  --   --   --    < > = values in this interval not displayed.     Microbiology Results  Results for orders  placed or performed during the hospital encounter of 10/06/18  Urine culture     Status: None   Collection Time: 10/06/18  7:36 AM   Specimen: In/Out Cath Urine  Result Value Ref Range Status   Specimen Description   Final    IN/OUT CATH URINE Performed at [facility], [address] [place], [place] [address]    Special Requests   Final    NONE Performed at [facility], [address] [place], [place] [address]    Culture   Final    Multiple bacterial morphotypes present, none predominant. Suggest appropriate recollection if clinically indicated.   Report Status 10/07/2018 FINAL  Final  SARS Coronavirus 2 [facility](Hospital order, Performed in [facility] hospital lab) Nasopharyngeal Nasopharyngeal Swab     Status: None   Collection Time: 10/06/18  7:36 AM   Specimen: Nasopharyngeal Swab  Result Value Ref Range Status   SARS Coronavirus 2 NEGATIVE NEGATIVE Final    Comment: (NOTE) If result is NEGATIVE SARS-CoV-2 target nucleic acids are NOT DETECTED. The SARS-CoV-2 RNA is generally detectable in upper and lower  respiratory specimens during the acute phase of infection. The lowest  concentration of SARS-CoV-2 viral copies this assay can detect is 250  copies / mL. A negative result does not preclude SARS-CoV-2 infection  and should not be used as the sole basis for treatment or other  patient management decisions.  A negative result may occur with  improper specimen collection / handling, submission of specimen other  than nasopharyngeal swab, presence of viral mutation(s) within the  areas targeted by this assay, and inadequate number of viral copies  (<250 copies / mL). A negative result must be combined with clinical  observations, patient history, and epidemiological information. If result is POSITIVE SARS-CoV-2 target nucleic acids are DETECTED. The SARS-CoV-2 RNA is generally detectable in upper and lower  respiratory specimens dur ing the acute phase of  infection.  Positive  results are indicative of active infection with SARS-CoV-2.  Clinical  correlation with patient history and other diagnostic information is  necessary to determine patient infection status.  Positive results do  not rule out bacterial infection or co-infection with other viruses. If result is PRESUMPTIVE POSTIVE SARS-CoV-2 nucleic acids MAY BE PRESENT.   A presumptive positive result was obtained on the submitted specimen  and confirmed on repeat testing.  While 2019 novel  coronavirus  (SARS-CoV-2) nucleic acids may be present in the submitted sample  additional confirmatory testing may be necessary for epidemiological  and / or clinical management purposes  to differentiate between  SARS-CoV-2 and other Sarbecovirus currently known to infect humans.  If clinically indicated additional testing with an alternate test  methodology [phone]([id number]) is advised. The SARS-CoV-2 RNA is generally  detectable in upper and lower respiratory sp ecimens during the acute  phase of infection. The expected result is Negative. Fact Sheet for Patients:  BoilerBrush.com.cyhttps://www.fda.gov/media/136312/download Fact Sheet for Healthcare Providers: https://pope.com/https://www.fda.gov/media/136313/download This test is not yet approved or cleared by the [place] FDA and has been authorized for detection and/or diagnosis of SARS-CoV-2 by FDA under an Emergency Use Authorization (EUA).  This EUA will remain in effect (meaning this test can be used) for the duration of the COVID-19 declaration under Section 564(b)(1) of the Act, 21 U.S.C. section 360bbb-3(b)(1), unless the authorization is terminated or revoked sooner. Performed at [facility], [address] [place], [place] [address]   Blood Culture (routine x 2)     Status: None (Preliminary result)   Collection Time: 10/06/18  7:37 AM   Specimen: BLOOD  Result Value Ref Range Status   Specimen Description BLOOD BLOOD LEFT HAND  Final   Special  Requests   Final    BOTTLES DRAWN AEROBIC AND ANAEROBIC Blood Culture results may not be optimal due to an excessive volume of blood received in culture bottles   Culture   Final    NO GROWTH 2 DAYS Performed at [facility], [address] [place], [place] [address]    Report Status PENDING  Incomplete  Blood Culture (routine x 2)     Status: None (Preliminary result)   Collection Time: 10/06/18  7:37 AM   Specimen: BLOOD  Result Value Ref Range Status   Specimen Description [facility]LOOD RIGHT [facility]  Final   Special Requests   Final    BLOOD Blood Culture results may not be optimal due to an inadequate volume of blood received in culture bottles   Culture   Final    NO GROWTH 2 DAYS Performed at [facility], [address] [place], [place] [address]    Report Status PENDING  Incomplete  C difficile quick scan w PCR reflex     Status: None   Collection Time: 10/07/18  5:03 AM   Specimen: STOOL  Result Value Ref Range Status   C Diff antigen NEGATIVE NEGATIVE Final   C Diff toxin NEGATIVE NEGATIVE Final   C Diff interpretation No C. difficile detected.  Final    Comment: Performed at [facility], [address] [place], [place] [address]  Respiratory Panel by PCR     Status: None   Collection Time: 10/08/18  1:00 AM   Specimen: Nasopharyngeal Swab; Respiratory  Result Value Ref Range Status   Adenovirus NOT DETECTED NOT DETECTED Final   Coronavirus 229E NOT DETECTED NOT DETECTED Final    Comment: (NOTE) The Coronavirus on the Respiratory Panel, DOES NOT test for the novel  Coronavirus (2019 nCoV)    Coronavirus HKU1 NOT DETECTED NOT DETECTED Final   Coronavirus NL63 NOT DETECTED NOT DETECTED Final   Coronavirus OC43 NOT DETECTED NOT DETECTED Final   Metapneumovirus NOT DETECTED NOT DETECTED Final   Rhinovirus / Enterovirus NOT DETECTED NOT DETECTED Final   Influenza A NOT DETECTED NOT DETECTED Final   Influenza B NOT DETECTED NOT DETECTED Final  Parainfluenza Virus 1 NOT DETECTED NOT DETECTED Final   Parainfluenza Virus 2 NOT DETECTED NOT DETECTED Final   Parainfluenza Virus 3 NOT DETECTED NOT DETECTED Final   Parainfluenza Virus 4 NOT DETECTED NOT DETECTED Final   Respiratory Syncytial Virus NOT DETECTED NOT DETECTED Final   Bordetella pertussis NOT DETECTED NOT DETECTED Final   Chlamydophila pneumoniae NOT DETECTED NOT DETECTED Final   Mycoplasma pneumoniae NOT DETECTED NOT DETECTED Final    Comment: Performed at [facility], [address] [place], [place] [address]  Gastrointestinal Panel by PCR , Stool     Status: None   Collection Time: 10/08/18  1:00 AM  Result Value Ref Range Status   Campylobacter species NOT DETECTED NOT DETECTED Final   Plesimonas shigelloides NOT DETECTED NOT DETECTED Final   Salmonella species NOT DETECTED NOT DETECTED Final   Yersinia enterocolitica NOT DETECTED NOT DETECTED Final   Vibrio species NOT DETECTED NOT DETECTED Final   Vibrio cholerae NOT DETECTED NOT DETECTED Final   Enteroaggregative E coli (EAEC) NOT DETECTED NOT DETECTED Final   Enteropathogenic E coli (EPEC) NOT DETECTED NOT DETECTED Final   Enterotoxigenic E coli (ETEC) NOT DETECTED NOT DETECTED Final   Shiga like toxin producing E coli (STEC) NOT DETECTED NOT DETECTED Final   Shigella/Enteroinvasive E coli (EIEC) NOT DETECTED NOT DETECTED Final   Cryptosporidium NOT DETECTED NOT DETECTED Final   Cyclospora cayetanensis NOT DETECTED NOT DETECTED Final   Entamoeba histolytica NOT DETECTED NOT DETECTED Final   Giardia lamblia NOT DETECTED NOT DETECTED Final   Adenovirus F40/41 NOT DETECTED NOT DETECTED Final   Astrovirus NOT DETECTED NOT DETECTED Final   Norovirus GI/GII NOT DETECTED NOT DETECTED Final   Rotavirus A NOT DETECTED NOT DETECTED Final   Sapovirus (I, II, IV, and V) NOT DETECTED NOT DETECTED Final    Comment: Performed at [facility], [address], [place], [place] [address]    RADIOLOGY:  No  results found.   Management plans discussed with the patient, family and they are in agreement.  CODE STATUS: Full Code   TOTAL TIME TAKING CARE OF THIS PATIENT: 45 minutes.    Jinny Blossom Mayo M.D on 10/08/2018 at 10:59 AM  Between 7am to 6pm - Pager - [phone]  After 6pm go to [email or web address] - Social research officer, government  [facility]  Office  [phone]  CC: Primary care physician; Gracelyn Nurse, MD   Note: This dictation was prepared with Dragon dictation along with smaller phrase technology. Any transcriptional errors that result from this process are unintentional.

## 2018-10-08 NOTE — Consult Note (Signed)
ANTICOAGULATION CONSULT NOTE   Pharmacy Consult for Warfarin Dosing Indication: atrial fibrillation  Allergies  Allergen Reactions  . Methyldopa Nausea Only and Palpitations    Other reaction(s): Unknown Other reaction(s): NAUSEA 'palpitation" Other reaction(s): NAUSEA Other reaction(s): NAUSEA     Patient Measurements: Height: 5\' 8"  (172.7 cm) Weight: 184 lb 3.2 oz (83.6 kg) IBW/kg (Calculated) : 68.4 Heparin Dosing Weight: N/A  Vital Signs: Temp: 98.4 F (36.9 C) (09/05 0826) Temp Source: Oral (09/05 0332) BP: 144/70 (09/05 0826) Pulse Rate: 61 (09/05 0826)  Labs: Recent Labs    10/06/18 0736 10/06/18 1039 10/06/18 2148 10/06/18 2244 10/06/18 2246 10/07/18 0800 10/08/18 0802  HGB 11.8*  --   --   --  9.8* 10.7* 10.2*  HCT 37.0*  --   --   --  29.7* 32.6* 31.1*  PLT 104*  --   --   --  88* 85* 78*  APTT 32  --   --  41*  --   --   --   LABPROT 16.8*  --   --  19.0*  --  21.8* 21.2*  INR 1.4*  --   --  1.6*  --  1.9* 1.9*  HEPARINUNFRC  --   --   --   --   --  <0.10*  --   CREATININE 1.07  --   --   --   --  1.03 0.99  TROPONINIHS 111* 118* 117*  --   --   --   --     Estimated Creatinine Clearance: 61.7 mL/min (by C-G formula based on SCr of 0.99 mg/dL).   Medical History: Past Medical History:  Diagnosis Date  . CHF (congestive heart failure) (HCC)   . Diabetes mellitus without complication (HCC)   . Essential hypertension   . Mixed hyperlipidemia   . Permanent atrial fibrillation    a. s/p catheter ablation ~ 12 yrs ago @ [facility] per family->unsuccessful; b. CHA2DS2VASc = 5-->chronic coumadin.    Medications:  Medications Prior to Admission  Medication Sig Dispense Refill Last Dose  . amLODipine (NORVASC) 5 MG tablet Take 1 tablet (5 mg total) by mouth daily. (Patient taking differently: Take 5 mg by mouth at bedtime. ) 90 tablet 3 10/05/2018 at 2000  . doxazosin (CARDURA) 4 MG tablet Take 4 mg by mouth Nightly.   10/05/2018 at 2000  . furosemide  (LASIX) 80 MG tablet Take 80 mg by mouth daily.   10/06/2018 at 0500  . glimepiride (AMARYL) 2 MG tablet Take 4 mg by mouth daily.   10/06/2018 at 0500  . lisinopril (PRINIVIL,ZESTRIL) 40 MG tablet Take 1 tablet (40 mg total) by mouth daily. (Patient taking differently: Take 40 mg by mouth at bedtime. ) 30 tablet 1 10/05/2018 at 2000  . metFORMIN (GLUCOPHAGE) 1000 MG tablet Take 1,000 mg by mouth 2 (two) times daily.   10/06/2018 at 0500  . pioglitazone (ACTOS) 15 MG tablet Take 15 mg by mouth daily.    10/06/2018 at 0500  . Potassium 99 MG TABS Take 1.5 tablets by mouth daily.   10/06/2018 at 0500  . simvastatin (ZOCOR) 20 MG tablet Take 20 mg by mouth Nightly.   10/05/2018 at 2000  . vitamin B-12 (CYANOCOBALAMIN) 1000 MCG tablet Take 1,000 mcg by mouth daily.   10/06/2018 at 0500  . warfarin (COUMADIN) 2 MG tablet Take 2 mg by mouth See admin instructions. Take 1 tablet by mouth  Monday-Friday  Take 1/2 tablet by mouth Saturday  and Sunday.  Patient takes in the evening.   10/05/2018 at 1800   Scheduled:  . amLODipine  5 mg Oral QHS  . doxazosin  4 mg Oral Nightly  . lisinopril  40 mg Oral QHS  . simvastatin  20 mg Oral Nightly  . sodium chloride flush  3 mL Intravenous Q12H  . sodium chloride flush  3 mL Intravenous Q12H  . Warfarin - Pharmacist Dosing Inpatient   Does not apply q1800   Infusions:  . sodium chloride 500 mL (10/07/18 1852)  . azithromycin Stopped (10/07/18 1350)  . cefTRIAXone (ROCEPHIN)  IV 1 g (10/07/18 1856)   PRN: sodium chloride, acetaminophen **OR** acetaminophen, loperamide, sodium chloride flush Anti-infectives (From admission, onward)   Start     Dose/Rate Route Frequency Ordered Stop   10/07/18 1800  cefTRIAXone (ROCEPHIN) 1 g in sodium chloride 0.9 % 100 mL IVPB     1 g 200 mL/hr over 30 Minutes Intravenous Every 24 hours 10/07/18 1105     10/07/18 1200  azithromycin (ZITHROMAX) 500 mg in sodium chloride 0.9 % 250 mL IVPB     500 mg 250 mL/hr over 60 Minutes Intravenous  Every 24 hours 10/07/18 1105     10/07/18 1000  vancomycin (VANCOCIN) 1,500 mg in sodium chloride 0.9 % 500 mL IVPB  Status:  Discontinued     1,500 mg 250 mL/hr over 120 Minutes Intravenous Every 24 hours 10/06/18 1402 10/07/18 1105   10/06/18 1800  ceFEPIme (MAXIPIME) 2 g in sodium chloride 0.9 % 100 mL IVPB  Status:  Discontinued     2 g 200 mL/hr over 30 Minutes Intravenous Every 12 hours 10/06/18 1343 10/07/18 1105   10/06/18 1400  metroNIDAZOLE (FLAGYL) IVPB 500 mg  Status:  Discontinued     50 0 mg 100 mL/hr over 60 Minutes Intravenous Every 8 hours 10/06/18 1343 10/07/18 1105   10/06/18 1400  vancomycin (VANCOCIN) IVPB 1000 mg/200 mL premix     1,000 mg 200 mL/hr over 60 Minutes Intravenous  Once 10/06/18 1343 10/06/18 1627   10/06/18 0830  vancomycin (VANCOCIN) IVPB 1000 mg/200 mL premix     1,000 mg 200 mL/hr over 60 Minutes Intravenous  Once 10/06/18 0815 10/06/18 1013   10/06/18 0830  piperacillin-tazobactam (ZOSYN) IVPB 3.375 g     3.375 g 100 mL/hr over 30 Minutes Intravenous  Once 10/06/18 0815 10/06/18 [id number]      Assessment: Pharmacy has been consulted for Warfarin dosing in 83yo patient with history of Afib. Patient states last dose taken was 2mg  tablet@ approximately 1800. Baseline labs have been ordered and obtained. Patient's home regimen is:  M-F 2mg , and Saturday and Sunday 1mg .  Drug interactions w/Warfarin:   Azithromycin - may increase INR   Today's INR: Subtherapeutic (s/p dose increase x1), but is a significant increase. Patient continues to have thrombocytopenia, though this appears to be his baseline. Confirmed with nurse there are no concerns for bleeding. Additionally, patient was also on heparin but the infusion was stopped this morning as there was no indication.   Date INR Dose 9/3 1.4 4mg   9/3 1.6       4mg  9/4 1.9 2mg  9/5       1.9       2mg   Goal of Therapy:  INR 2-3 Monitor platelets by anticoagulation protocol: Yes   Plan:  Will order  Warfarin 2mg  PO x 1 dose.  MD aware of platelet count. Per cards, patient to continue medical management with warfarin.  Plt 88 > 85 >78  Will check INR daily while on antibiotic and CBC every 3 days.   Oswald Hillock, PharmD, BCPS 10/08/2018,10:11 AM

## 2018-10-08 NOTE — Plan of Care (Signed)
  Problem: Clinical Measurements: Goal: Will remain free from infection Outcome: Progressing Goal: Diagnostic test results will improve Outcome: Progressing   Problem: Safety: Goal: Ability to remain free from injury will improve Outcome: Progressing   Problem: Clinical Measurements: Goal: Signs and symptoms of infection will decrease Outcome: Progressing

## 2018-10-08 NOTE — Progress Notes (Signed)
Discharged to home with his wife.  Antibiotic prescription went to [facility].  Wife knows she has to make the follow up appointments.

## 2018-10-08 NOTE — Discharge Instructions (Signed)
It was so nice to meet you during this hospitalization!  You came into the hospital with fevers and chills. Your labs and imaging showed that you likely had an early pneumonia. Your blood and urine did not grow any bacteria.  I have prescribed an antibiotic called doxycycline. Please take 1 tablet tonight, and then 1 tablet twice a day for 2 more days.  Take care, Dr. Brett Albino

## 2018-10-08 NOTE — TOC Transition Note (Signed)
Transition of Care [facility]) - CM/SW Discharge Note   Patient Details  Name: Andrew [name] MRN: [id number] Date of Birth: October 18, 1935  [facility]) CM/SW Contact:  Latanya Maudlin, RN Phone Number: 10/08/2018, 11:13 AM   Clinical Narrative: Patient to be discharged per MD order. Orders in place for home health services. Spoke with wife and reviewed CMS list to provide choice of agency. Spouse and patient have no preference of agency. Referral placed with [facility]. No DME needs. Spouse to transport.       Final next level of care: Home w Home Health Services Barriers to Discharge: No Barriers Identified   Patient Goals and CMS Choice   CMS Medicare.gov Compare Post Acute Care list provided to:: Patient Choice offered to / list presented to : Patient  Discharge Placement                       Discharge Plan and Services                          HH Arranged: OT, PT [facility] Agency: [facility] Date [facility] Agency Contacted: 10/08/18 Time [place]: [phone] Representative spoke with at [employer]: [place]) Interventions     Readmission Risk Interventions Readmission Risk Prevention Plan 10/08/2018  Transportation Screening Complete  PCP or Specialist Appt within 5-7 Days Complete  Home Care Screening Complete  Medication Review (RN CM) Complete

## 2018-10-08 NOTE — Progress Notes (Signed)
Patient is much weaker and less stable than he has been.  I've asked his wife to call us when he needs to get up to the commode as I don't think she's strong enough to help him.  She agrees.

## 2018-10-11 ENCOUNTER — Other Ambulatory Visit: Payer: Self-pay | Admitting: *Deleted

## 2018-10-11 LAB — CULTURE, BLOOD (ROUTINE X 2)
Culture: NO GROWTH
Culture: NO GROWTH

## 2018-10-11 NOTE — Patient Outreach (Signed)
Red EMMI flag received "no primary care visit scheduled", spoke with pt and permission given to speak with wife Mrs. Iona Beard.  Per wife due to the weekend and Labor Day, everything was closed and she has not had the opportunity to schedule the appointment but plans to today, Mrs. Mceachin states " he's doing very well, everything is a-okay, breathing and everything"  No concerns voiced.  Jacqlyn Larsen [facility], BSN [facility] Coordinator [phone]  f

## 2018-10-24 DIAGNOSIS — I4891 Unspecified atrial fibrillation: Secondary | ICD-10-CM | POA: Diagnosis not present

## 2018-10-24 DIAGNOSIS — I4892 Unspecified atrial flutter: Secondary | ICD-10-CM | POA: Diagnosis not present

## 2018-11-28 DIAGNOSIS — E119 Type 2 diabetes mellitus without complications: Secondary | ICD-10-CM | POA: Diagnosis not present

## 2018-11-28 DIAGNOSIS — I4892 Unspecified atrial flutter: Secondary | ICD-10-CM | POA: Diagnosis not present

## 2018-11-28 DIAGNOSIS — I4891 Unspecified atrial fibrillation: Secondary | ICD-10-CM | POA: Diagnosis not present

## 2018-12-05 DIAGNOSIS — E782 Mixed hyperlipidemia: Secondary | ICD-10-CM | POA: Diagnosis not present

## 2018-12-05 DIAGNOSIS — Z7901 Long term (current) use of anticoagulants: Secondary | ICD-10-CM | POA: Diagnosis not present

## 2018-12-05 DIAGNOSIS — E119 Type 2 diabetes mellitus without complications: Secondary | ICD-10-CM | POA: Diagnosis not present

## 2018-12-05 DIAGNOSIS — I10 Essential (primary) hypertension: Secondary | ICD-10-CM | POA: Diagnosis not present

## 2018-12-05 DIAGNOSIS — I4892 Unspecified atrial flutter: Secondary | ICD-10-CM | POA: Diagnosis not present

## 2018-12-05 DIAGNOSIS — I4891 Unspecified atrial fibrillation: Secondary | ICD-10-CM | POA: Diagnosis not present

## 2019-01-01 LAB — CUP PACEART REMOTE DEVICE CHECK
Battery Remaining Longevity: 91 mo
Battery Remaining Percentage: 95.5 %
Battery Voltage: 2.99 V
Brady Statistic RV Percent Paced: 76 %
Date Time Interrogation Session: 20201127185105
Implantable Lead Implant Date: 20200221
Implantable Lead Location: 753860
Implantable Pulse Generator Implant Date: 20200221
Lead Channel Impedance Value: 560 Ohm
Lead Channel Pacing Threshold Amplitude: 0.75 V
Lead Channel Pacing Threshold Pulse Width: 0.5 ms
Lead Channel Sensing Intrinsic Amplitude: 12 mV
Lead Channel Setting Pacing Amplitude: 3.5 V
Lead Channel Setting Pacing Pulse Width: 0.5 ms
Lead Channel Setting Sensing Sensitivity: 2 mV
Pulse Gen Model: 1272
Pulse Gen Serial Number: 9114203

## 2019-01-02 ENCOUNTER — Ambulatory Visit (INDEPENDENT_AMBULATORY_CARE_PROVIDER_SITE_OTHER): Payer: Medicare HMO | Admitting: *Deleted

## 2019-01-02 DIAGNOSIS — I4892 Unspecified atrial flutter: Secondary | ICD-10-CM | POA: Diagnosis not present

## 2019-01-02 DIAGNOSIS — I4891 Unspecified atrial fibrillation: Secondary | ICD-10-CM | POA: Diagnosis not present

## 2019-01-02 DIAGNOSIS — R001 Bradycardia, unspecified: Secondary | ICD-10-CM | POA: Diagnosis not present

## 2019-01-09 DIAGNOSIS — I4891 Unspecified atrial fibrillation: Secondary | ICD-10-CM | POA: Diagnosis not present

## 2019-01-09 DIAGNOSIS — I4892 Unspecified atrial flutter: Secondary | ICD-10-CM | POA: Diagnosis not present

## 2019-01-25 NOTE — Progress Notes (Signed)
PPM remote 

## 2019-02-07 DIAGNOSIS — I4892 Unspecified atrial flutter: Secondary | ICD-10-CM | POA: Diagnosis not present

## 2019-02-07 DIAGNOSIS — I4891 Unspecified atrial fibrillation: Secondary | ICD-10-CM | POA: Diagnosis not present

## 2019-02-10 DIAGNOSIS — I4891 Unspecified atrial fibrillation: Secondary | ICD-10-CM | POA: Diagnosis not present

## 2019-02-10 DIAGNOSIS — I4892 Unspecified atrial flutter: Secondary | ICD-10-CM | POA: Diagnosis not present

## 2019-03-06 DIAGNOSIS — E119 Type 2 diabetes mellitus without complications: Secondary | ICD-10-CM | POA: Diagnosis not present

## 2019-03-13 DIAGNOSIS — E782 Mixed hyperlipidemia: Secondary | ICD-10-CM | POA: Diagnosis not present

## 2019-03-13 DIAGNOSIS — E1165 Type 2 diabetes mellitus with hyperglycemia: Secondary | ICD-10-CM | POA: Diagnosis not present

## 2019-03-13 DIAGNOSIS — I4892 Unspecified atrial flutter: Secondary | ICD-10-CM | POA: Diagnosis not present

## 2019-03-13 DIAGNOSIS — I10 Essential (primary) hypertension: Secondary | ICD-10-CM | POA: Diagnosis not present

## 2019-03-13 DIAGNOSIS — I4891 Unspecified atrial fibrillation: Secondary | ICD-10-CM | POA: Diagnosis not present

## 2019-03-16 DIAGNOSIS — I4891 Unspecified atrial fibrillation: Secondary | ICD-10-CM | POA: Diagnosis not present

## 2019-03-16 DIAGNOSIS — I4892 Unspecified atrial flutter: Secondary | ICD-10-CM | POA: Diagnosis not present

## 2019-03-31 ENCOUNTER — Other Ambulatory Visit: Payer: Self-pay | Admitting: Family

## 2019-04-03 ENCOUNTER — Ambulatory Visit (INDEPENDENT_AMBULATORY_CARE_PROVIDER_SITE_OTHER): Payer: Medicare HMO | Admitting: *Deleted

## 2019-04-03 DIAGNOSIS — R001 Bradycardia, unspecified: Secondary | ICD-10-CM | POA: Diagnosis not present

## 2019-04-03 LAB — CUP PACEART REMOTE DEVICE CHECK
Battery Remaining Longevity: 91 mo
Battery Remaining Percentage: 95.5 %
Battery Voltage: 2.99 V
Brady Statistic RV Percent Paced: 79 %
Date Time Interrogation Session: 20210301050608
Implantable Lead Implant Date: 20200221
Implantable Lead Location: 753860
Implantable Pulse Generator Implant Date: 20200221
Lead Channel Impedance Value: 640 Ohm
Lead Channel Pacing Threshold Amplitude: 0.75 V
Lead Channel Pacing Threshold Pulse Width: 0.5 ms
Lead Channel Sensing Intrinsic Amplitude: 12 mV
Lead Channel Setting Pacing Amplitude: 3.5 V
Lead Channel Setting Pacing Pulse Width: 0.5 ms
Lead Channel Setting Sensing Sensitivity: 2 mV
Pulse Gen Model: 1272
Pulse Gen Serial Number: 9114203

## 2019-04-03 NOTE — Progress Notes (Signed)
PPM Remote  

## 2019-04-13 DIAGNOSIS — I4892 Unspecified atrial flutter: Secondary | ICD-10-CM | POA: Diagnosis not present

## 2019-04-13 DIAGNOSIS — I4891 Unspecified atrial fibrillation: Secondary | ICD-10-CM | POA: Diagnosis not present

## 2019-04-17 DIAGNOSIS — I4892 Unspecified atrial flutter: Secondary | ICD-10-CM | POA: Diagnosis not present

## 2019-04-17 DIAGNOSIS — I4891 Unspecified atrial fibrillation: Secondary | ICD-10-CM | POA: Diagnosis not present

## 2019-04-21 DIAGNOSIS — S51811A Laceration without foreign body of right forearm, initial encounter: Secondary | ICD-10-CM | POA: Diagnosis not present

## 2019-04-21 DIAGNOSIS — W010XXA Fall on same level from slipping, tripping and stumbling without subsequent striking against object, initial encounter: Secondary | ICD-10-CM | POA: Diagnosis not present

## 2019-05-19 DIAGNOSIS — I4892 Unspecified atrial flutter: Secondary | ICD-10-CM | POA: Diagnosis not present

## 2019-05-19 DIAGNOSIS — I4891 Unspecified atrial fibrillation: Secondary | ICD-10-CM | POA: Diagnosis not present

## 2019-06-05 DIAGNOSIS — E119 Type 2 diabetes mellitus without complications: Secondary | ICD-10-CM | POA: Diagnosis not present

## 2019-06-12 DIAGNOSIS — I4891 Unspecified atrial fibrillation: Secondary | ICD-10-CM | POA: Diagnosis not present

## 2019-06-12 DIAGNOSIS — I4892 Unspecified atrial flutter: Secondary | ICD-10-CM | POA: Diagnosis not present

## 2019-06-12 DIAGNOSIS — Z7901 Long term (current) use of anticoagulants: Secondary | ICD-10-CM | POA: Diagnosis not present

## 2019-06-12 DIAGNOSIS — I10 Essential (primary) hypertension: Secondary | ICD-10-CM | POA: Diagnosis not present

## 2019-06-12 DIAGNOSIS — E119 Type 2 diabetes mellitus without complications: Secondary | ICD-10-CM | POA: Diagnosis not present

## 2019-06-12 DIAGNOSIS — Z79899 Other long term (current) drug therapy: Secondary | ICD-10-CM | POA: Diagnosis not present

## 2019-06-12 DIAGNOSIS — Z0000 Encounter for general adult medical examination without abnormal findings: Secondary | ICD-10-CM | POA: Diagnosis not present

## 2019-06-12 DIAGNOSIS — E782 Mixed hyperlipidemia: Secondary | ICD-10-CM | POA: Diagnosis not present

## 2019-06-19 DIAGNOSIS — I4892 Unspecified atrial flutter: Secondary | ICD-10-CM | POA: Diagnosis not present

## 2019-06-19 DIAGNOSIS — I4891 Unspecified atrial fibrillation: Secondary | ICD-10-CM | POA: Diagnosis not present

## 2019-06-21 DIAGNOSIS — I4892 Unspecified atrial flutter: Secondary | ICD-10-CM | POA: Diagnosis not present

## 2019-06-21 DIAGNOSIS — I4891 Unspecified atrial fibrillation: Secondary | ICD-10-CM | POA: Diagnosis not present

## 2019-06-23 DIAGNOSIS — H524 Presbyopia: Secondary | ICD-10-CM | POA: Diagnosis not present

## 2019-07-05 ENCOUNTER — Ambulatory Visit (INDEPENDENT_AMBULATORY_CARE_PROVIDER_SITE_OTHER): Payer: Medicare HMO | Admitting: *Deleted

## 2019-07-05 DIAGNOSIS — R001 Bradycardia, unspecified: Secondary | ICD-10-CM

## 2019-07-05 LAB — CUP PACEART REMOTE DEVICE CHECK
Battery Remaining Longevity: 97 mo
Battery Remaining Percentage: 95.5 %
Battery Voltage: 2.99 V
Brady Statistic RV Percent Paced: 81 %
Date Time Interrogation Session: 20210531020015
Implantable Lead Implant Date: 20200221
Implantable Lead Location: 753860
Implantable Pulse Generator Implant Date: 20200221
Lead Channel Impedance Value: 610 Ohm
Lead Channel Pacing Threshold Amplitude: 0.75 V
Lead Channel Pacing Threshold Pulse Width: 0.5 ms
Lead Channel Sensing Intrinsic Amplitude: 12 mV
Lead Channel Setting Pacing Amplitude: 3.5 V
Lead Channel Setting Pacing Pulse Width: 0.5 ms
Lead Channel Setting Sensing Sensitivity: 2 mV
Pulse Gen Model: 1272
Pulse Gen Serial Number: 9114203

## 2019-07-07 NOTE — Progress Notes (Signed)
Remote pacemaker transmission.   

## 2019-07-20 DIAGNOSIS — I4892 Unspecified atrial flutter: Secondary | ICD-10-CM | POA: Diagnosis not present

## 2019-07-20 DIAGNOSIS — I4891 Unspecified atrial fibrillation: Secondary | ICD-10-CM | POA: Diagnosis not present

## 2019-08-24 DIAGNOSIS — I4892 Unspecified atrial flutter: Secondary | ICD-10-CM | POA: Diagnosis not present

## 2019-08-24 DIAGNOSIS — I4891 Unspecified atrial fibrillation: Secondary | ICD-10-CM | POA: Diagnosis not present

## 2019-08-28 DIAGNOSIS — I4892 Unspecified atrial flutter: Secondary | ICD-10-CM | POA: Diagnosis not present

## 2019-08-28 DIAGNOSIS — I4891 Unspecified atrial fibrillation: Secondary | ICD-10-CM | POA: Diagnosis not present

## 2019-08-30 DIAGNOSIS — R791 Abnormal coagulation profile: Secondary | ICD-10-CM | POA: Diagnosis not present

## 2019-09-12 DIAGNOSIS — E119 Type 2 diabetes mellitus without complications: Secondary | ICD-10-CM | POA: Diagnosis not present

## 2019-09-20 DIAGNOSIS — Z0001 Encounter for general adult medical examination with abnormal findings: Secondary | ICD-10-CM | POA: Diagnosis not present

## 2019-09-20 DIAGNOSIS — I4892 Unspecified atrial flutter: Secondary | ICD-10-CM | POA: Diagnosis not present

## 2019-09-20 DIAGNOSIS — E782 Mixed hyperlipidemia: Secondary | ICD-10-CM | POA: Diagnosis not present

## 2019-09-20 DIAGNOSIS — I10 Essential (primary) hypertension: Secondary | ICD-10-CM | POA: Diagnosis not present

## 2019-09-20 DIAGNOSIS — I4891 Unspecified atrial fibrillation: Secondary | ICD-10-CM | POA: Diagnosis not present

## 2019-09-20 DIAGNOSIS — E119 Type 2 diabetes mellitus without complications: Secondary | ICD-10-CM | POA: Diagnosis not present

## 2019-10-04 ENCOUNTER — Ambulatory Visit (INDEPENDENT_AMBULATORY_CARE_PROVIDER_SITE_OTHER): Payer: Medicare HMO | Admitting: *Deleted

## 2019-10-04 DIAGNOSIS — I480 Paroxysmal atrial fibrillation: Secondary | ICD-10-CM

## 2019-10-04 LAB — CUP PACEART REMOTE DEVICE CHECK
Battery Remaining Longevity: 96 mo
Battery Remaining Percentage: 95.5 %
Battery Voltage: 2.99 V
Brady Statistic RV Percent Paced: 84 %
Date Time Interrogation Session: 20210901020012
Implantable Lead Implant Date: 20200221
Implantable Lead Location: 753860
Implantable Pulse Generator Implant Date: 20200221
Lead Channel Impedance Value: 630 Ohm
Lead Channel Pacing Threshold Amplitude: 0.75 V
Lead Channel Pacing Threshold Pulse Width: 0.5 ms
Lead Channel Sensing Intrinsic Amplitude: 12 mV
Lead Channel Setting Pacing Amplitude: 3.5 V
Lead Channel Setting Pacing Pulse Width: 0.5 ms
Lead Channel Setting Sensing Sensitivity: 2 mV
Pulse Gen Model: 1272
Pulse Gen Serial Number: 9114203

## 2019-10-06 NOTE — Progress Notes (Signed)
Remote pacemaker transmission.   

## 2019-10-13 DIAGNOSIS — I4891 Unspecified atrial fibrillation: Secondary | ICD-10-CM | POA: Diagnosis not present

## 2019-10-13 DIAGNOSIS — I4892 Unspecified atrial flutter: Secondary | ICD-10-CM | POA: Diagnosis not present

## 2019-10-16 DIAGNOSIS — I4891 Unspecified atrial fibrillation: Secondary | ICD-10-CM | POA: Diagnosis not present

## 2019-10-16 DIAGNOSIS — I4892 Unspecified atrial flutter: Secondary | ICD-10-CM | POA: Diagnosis not present

## 2019-11-15 DIAGNOSIS — I4891 Unspecified atrial fibrillation: Secondary | ICD-10-CM | POA: Diagnosis not present

## 2019-11-15 DIAGNOSIS — I4892 Unspecified atrial flutter: Secondary | ICD-10-CM | POA: Diagnosis not present

## 2019-11-19 IMAGING — DX DG CHEST 1V PORT
1 series · 1 of 1 positions shown · non-contrast
Comparison: None.

CLINICAL DATA: New onset CHF.

EXAM:
PORTABLE CHEST 1 VIEW

[chest ap]
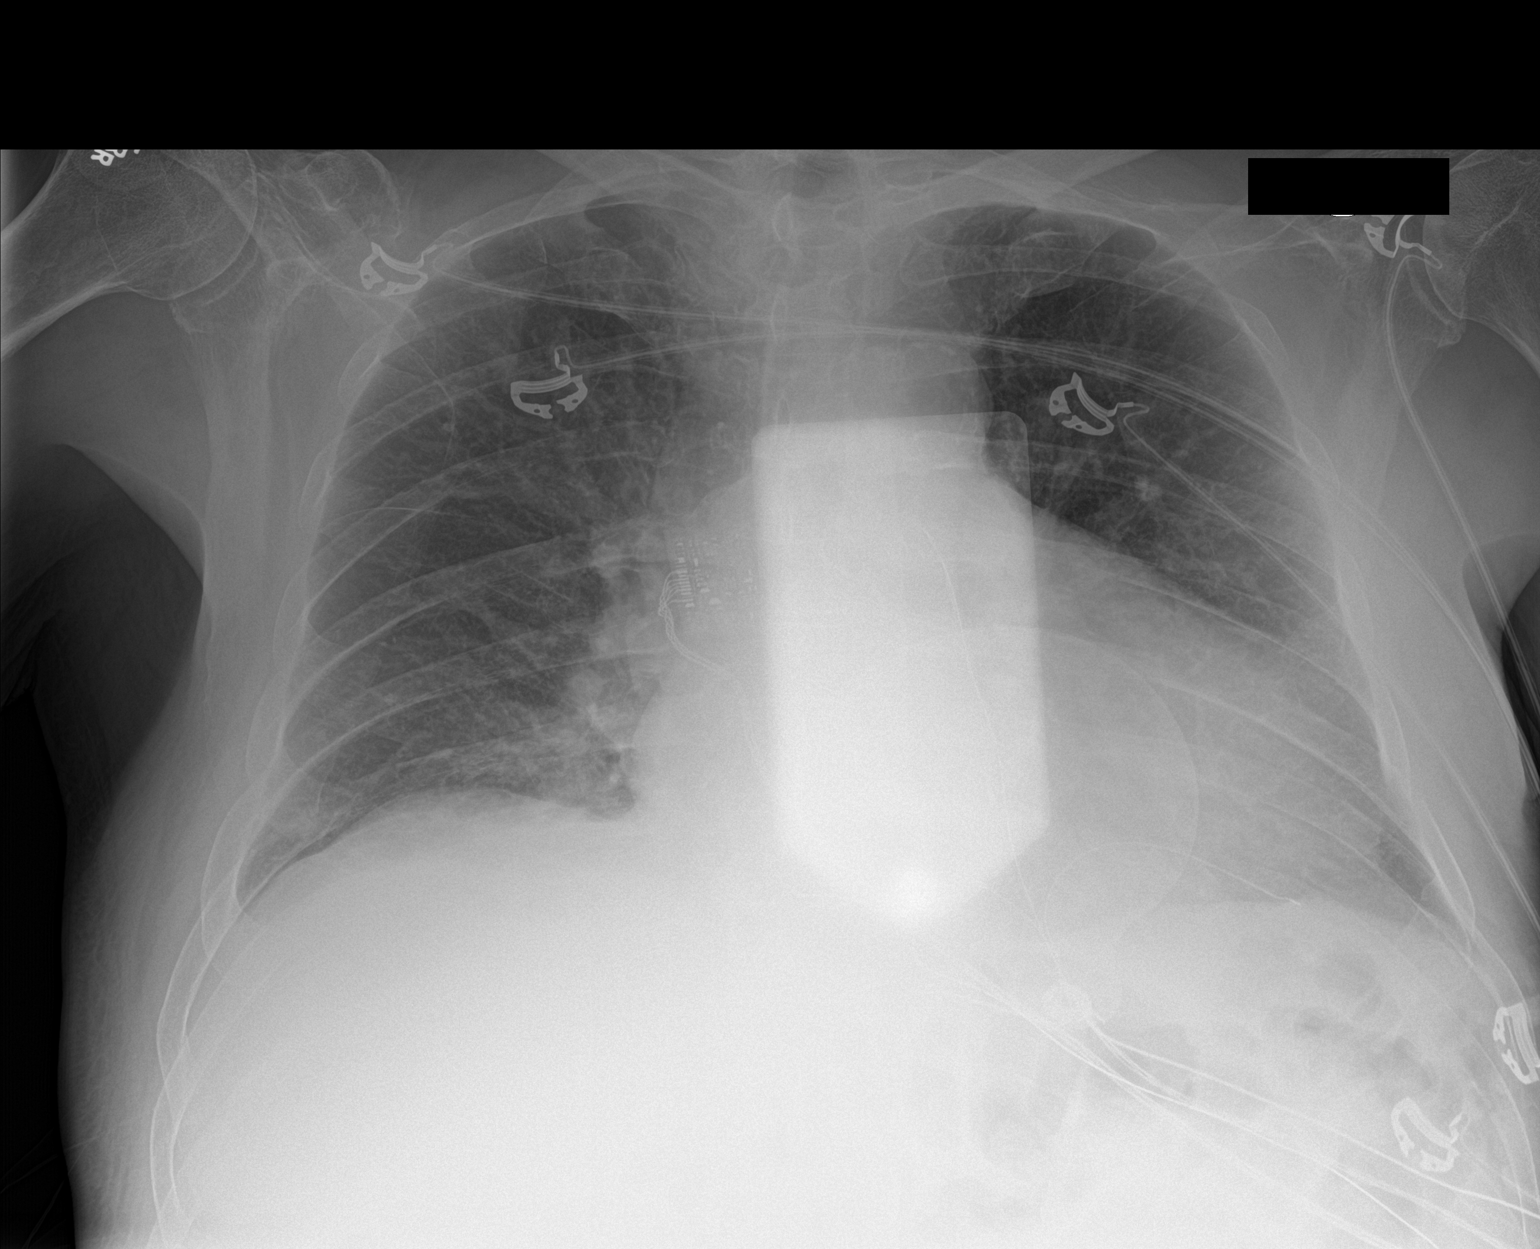

[1 of 1 positions shown; findings below may reference images not displayed]

FINDINGS: 6207 hours. Low lung volumes accentuated by lordotic positioning.
The cardio pericardial silhouette is enlarged. There is pulmonary
vascular congestion without overt pulmonary edema. No substantial
pleural effusion or focal airspace consolidation. The visualized
bony structures of the thorax are intact. Telemetry leads overlie
the chest.
IMPRESSION: Low volumes with cardiomegaly and vascular congestion. No overt
pulmonary edema.

## 2019-11-20 DIAGNOSIS — R791 Abnormal coagulation profile: Secondary | ICD-10-CM | POA: Diagnosis not present

## 2019-12-05 DIAGNOSIS — I4892 Unspecified atrial flutter: Secondary | ICD-10-CM | POA: Diagnosis not present

## 2019-12-05 DIAGNOSIS — I4891 Unspecified atrial fibrillation: Secondary | ICD-10-CM | POA: Diagnosis not present

## 2019-12-07 DIAGNOSIS — I4891 Unspecified atrial fibrillation: Secondary | ICD-10-CM | POA: Diagnosis not present

## 2019-12-07 DIAGNOSIS — I4892 Unspecified atrial flutter: Secondary | ICD-10-CM | POA: Diagnosis not present

## 2019-12-15 DIAGNOSIS — E119 Type 2 diabetes mellitus without complications: Secondary | ICD-10-CM | POA: Diagnosis not present

## 2019-12-21 DIAGNOSIS — I10 Essential (primary) hypertension: Secondary | ICD-10-CM | POA: Diagnosis not present

## 2019-12-21 DIAGNOSIS — E782 Mixed hyperlipidemia: Secondary | ICD-10-CM | POA: Diagnosis not present

## 2019-12-21 DIAGNOSIS — E119 Type 2 diabetes mellitus without complications: Secondary | ICD-10-CM | POA: Diagnosis not present

## 2019-12-21 DIAGNOSIS — I4891 Unspecified atrial fibrillation: Secondary | ICD-10-CM | POA: Diagnosis not present

## 2020-01-03 ENCOUNTER — Ambulatory Visit (INDEPENDENT_AMBULATORY_CARE_PROVIDER_SITE_OTHER): Payer: Medicare HMO

## 2020-01-03 DIAGNOSIS — R001 Bradycardia, unspecified: Secondary | ICD-10-CM | POA: Diagnosis not present

## 2020-01-05 LAB — CUP PACEART REMOTE DEVICE CHECK
Battery Remaining Longevity: 95 mo
Battery Remaining Percentage: 95.5 %
Battery Voltage: 2.99 V
Brady Statistic RV Percent Paced: 85 %
Date Time Interrogation Session: 20211202020022
Implantable Lead Implant Date: 20200221
Implantable Lead Location: 753860
Implantable Pulse Generator Implant Date: 20200221
Lead Channel Impedance Value: 600 Ohm
Lead Channel Pacing Threshold Amplitude: 0.75 V
Lead Channel Pacing Threshold Pulse Width: 0.5 ms
Lead Channel Sensing Intrinsic Amplitude: 12 mV
Lead Channel Setting Pacing Amplitude: 3.5 V
Lead Channel Setting Pacing Pulse Width: 0.5 ms
Lead Channel Setting Sensing Sensitivity: 2 mV
Pulse Gen Model: 1272
Pulse Gen Serial Number: 9114203

## 2020-01-10 NOTE — Progress Notes (Signed)
Remote pacemaker transmission.   

## 2020-01-18 DIAGNOSIS — I4891 Unspecified atrial fibrillation: Secondary | ICD-10-CM | POA: Diagnosis not present

## 2020-01-18 DIAGNOSIS — I4892 Unspecified atrial flutter: Secondary | ICD-10-CM | POA: Diagnosis not present

## 2020-03-04 DIAGNOSIS — R413 Other amnesia: Secondary | ICD-10-CM | POA: Diagnosis not present

## 2020-03-27 DIAGNOSIS — E119 Type 2 diabetes mellitus without complications: Secondary | ICD-10-CM | POA: Diagnosis not present

## 2020-03-27 DIAGNOSIS — B351 Tinea unguium: Secondary | ICD-10-CM | POA: Diagnosis not present

## 2020-03-27 DIAGNOSIS — I4892 Unspecified atrial flutter: Secondary | ICD-10-CM | POA: Diagnosis not present

## 2020-03-27 DIAGNOSIS — L851 Acquired keratosis [keratoderma] palmaris et plantaris: Secondary | ICD-10-CM | POA: Diagnosis not present

## 2020-03-27 DIAGNOSIS — I4891 Unspecified atrial fibrillation: Secondary | ICD-10-CM | POA: Diagnosis not present

## 2020-04-03 ENCOUNTER — Ambulatory Visit (INDEPENDENT_AMBULATORY_CARE_PROVIDER_SITE_OTHER): Payer: Medicare HMO

## 2020-04-03 DIAGNOSIS — R001 Bradycardia, unspecified: Secondary | ICD-10-CM | POA: Diagnosis not present

## 2020-04-04 LAB — CUP PACEART REMOTE DEVICE CHECK
Battery Remaining Longevity: 94 mo
Battery Remaining Percentage: 95.5 %
Battery Voltage: 2.99 V
Brady Statistic RV Percent Paced: 87 %
Date Time Interrogation Session: 20220302083148
Implantable Lead Implant Date: 20200221
Implantable Lead Location: 753860
Implantable Pulse Generator Implant Date: 20200221
Lead Channel Impedance Value: 590 Ohm
Lead Channel Pacing Threshold Amplitude: 0.75 V
Lead Channel Pacing Threshold Pulse Width: 0.5 ms
Lead Channel Sensing Intrinsic Amplitude: 12 mV
Lead Channel Setting Pacing Amplitude: 3.5 V
Lead Channel Setting Pacing Pulse Width: 0.5 ms
Lead Channel Setting Sensing Sensitivity: 2 mV
Pulse Gen Model: 1272
Pulse Gen Serial Number: 9114203

## 2020-04-11 NOTE — Progress Notes (Signed)
Remote pacemaker transmission.   

## 2020-04-24 DIAGNOSIS — I4892 Unspecified atrial flutter: Secondary | ICD-10-CM | POA: Diagnosis not present

## 2020-04-24 DIAGNOSIS — I4891 Unspecified atrial fibrillation: Secondary | ICD-10-CM | POA: Diagnosis not present

## 2020-05-24 DIAGNOSIS — I4891 Unspecified atrial fibrillation: Secondary | ICD-10-CM | POA: Diagnosis not present

## 2020-05-24 DIAGNOSIS — I4892 Unspecified atrial flutter: Secondary | ICD-10-CM | POA: Diagnosis not present

## 2020-06-03 IMAGING — DX DG CHEST 1V PORT
1 series · 1 of 1 positions shown · non-contrast
Comparison: Chest x-ray dated March 26, 2018.

CLINICAL DATA: Fever.

EXAM:
PORTABLE CHEST 1 VIEW

[chest ap]
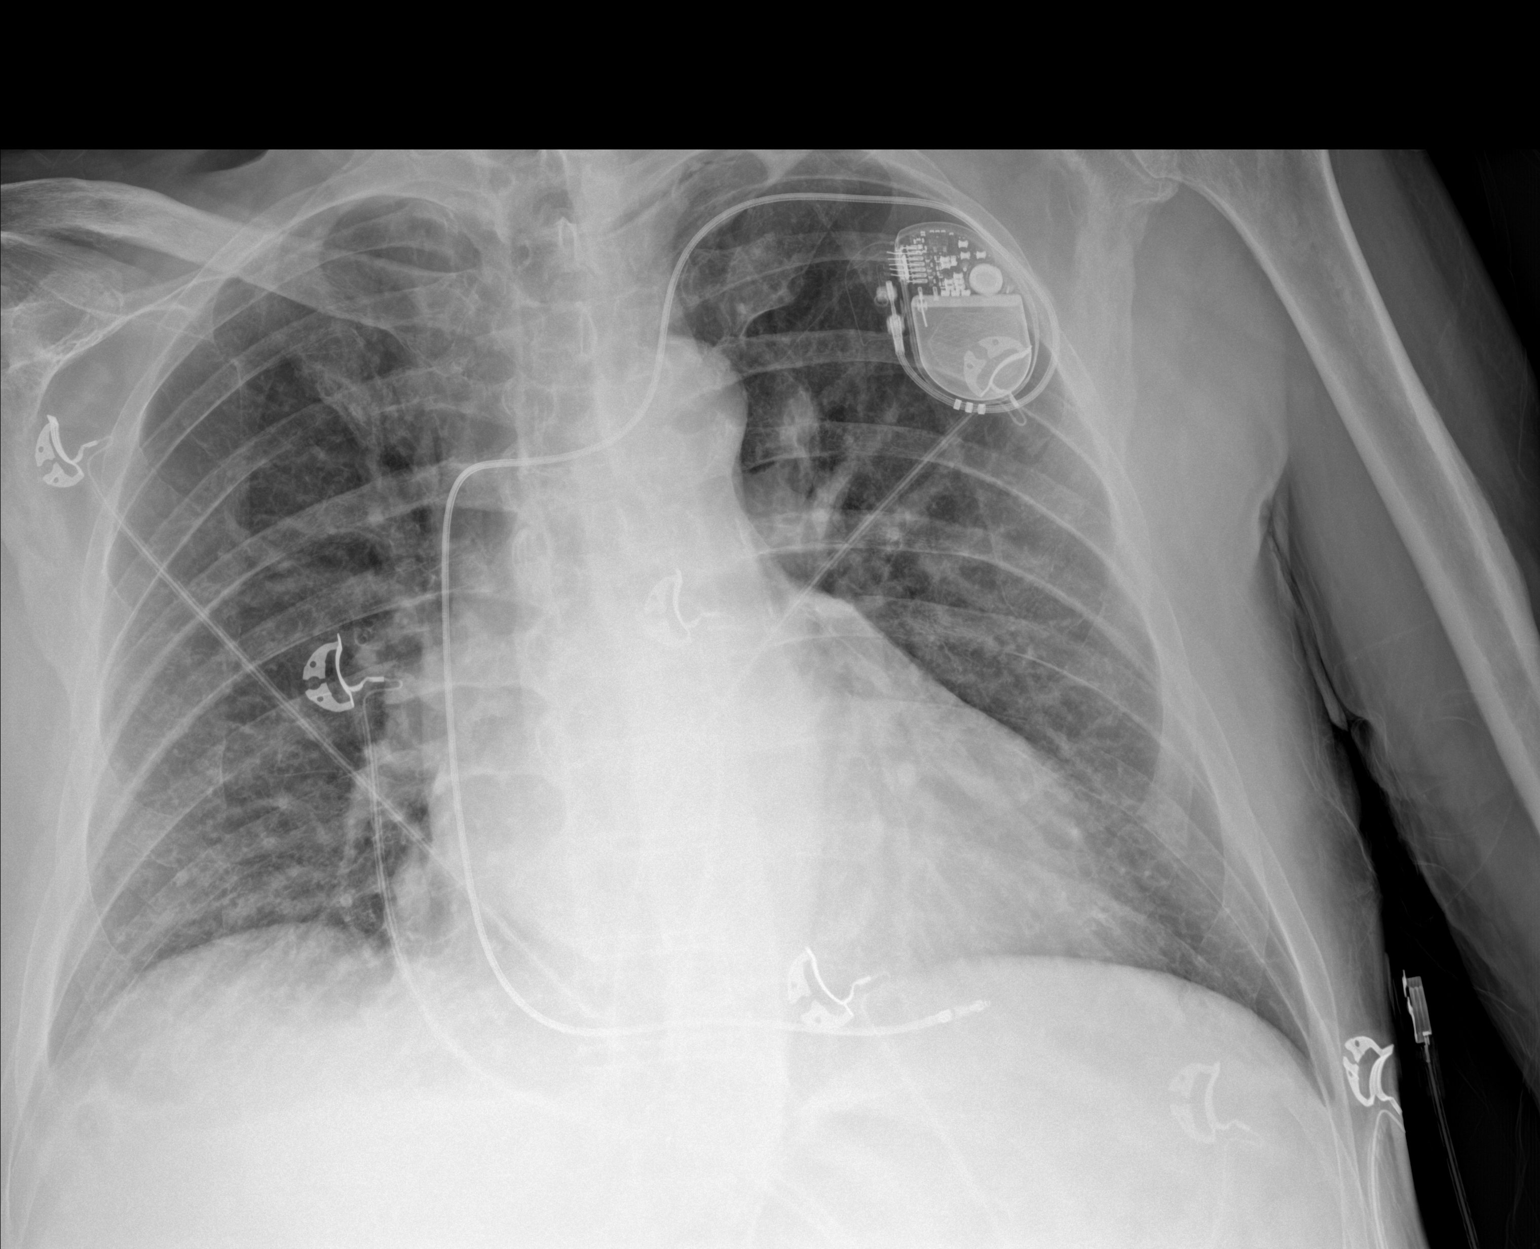

[1 of 1 positions shown; findings below may reference images not displayed]

FINDINGS: Unchanged left chest wall pacemaker. Stable cardiomegaly. Mild
pulmonary vascular congestion. No focal consolidation, pleural
effusion, or pneumothorax. No acute osseous abnormality.
IMPRESSION: 1. Mild pulmonary vascular congestion.

## 2020-06-04 IMAGING — CR DG CHEST 2V
2 series · 2 of 2 positions shown · non-contrast
Comparison: 10/06/2018

CLINICAL DATA: Fever.

EXAM:
CHEST - 2 VIEW

[chest lat]
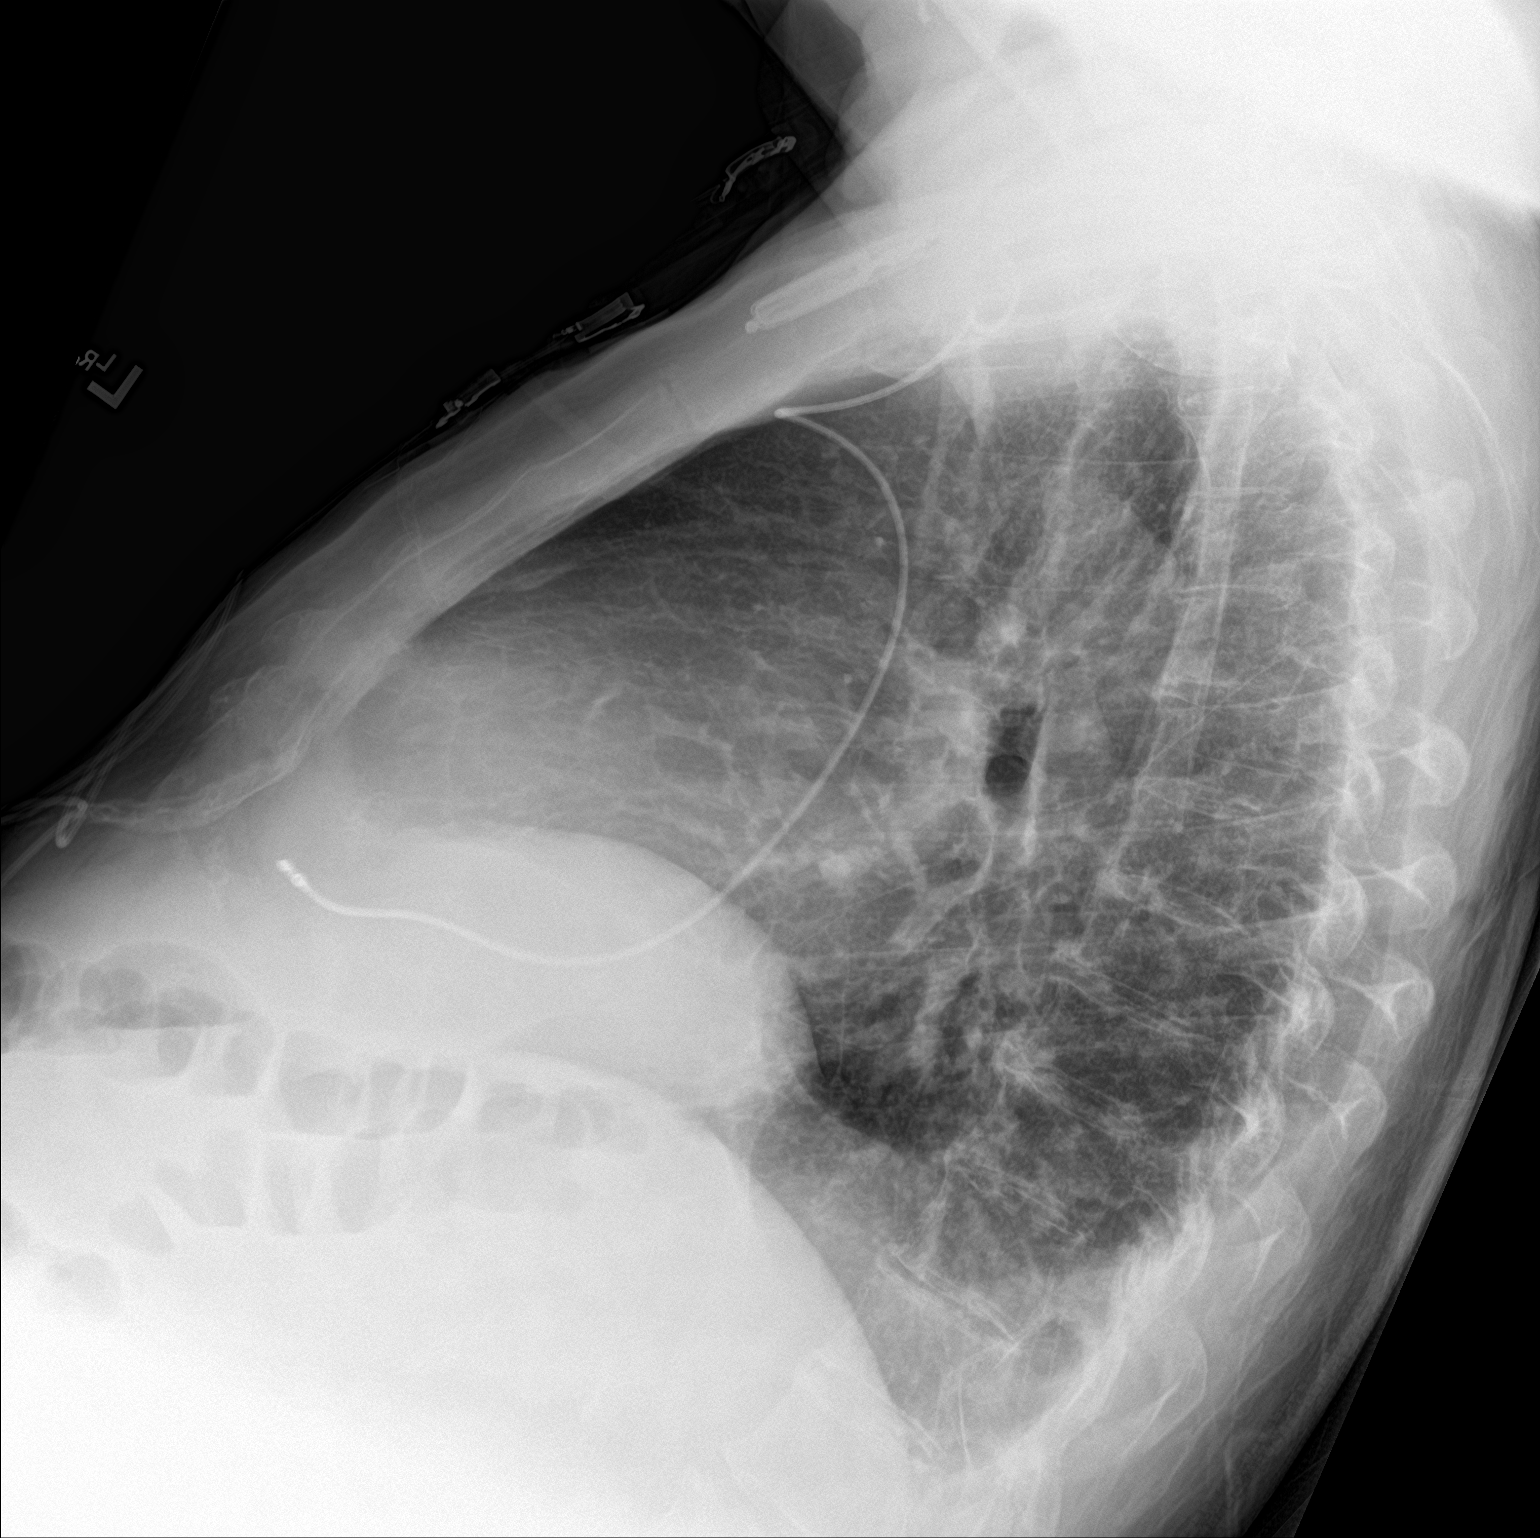

[chest ap]
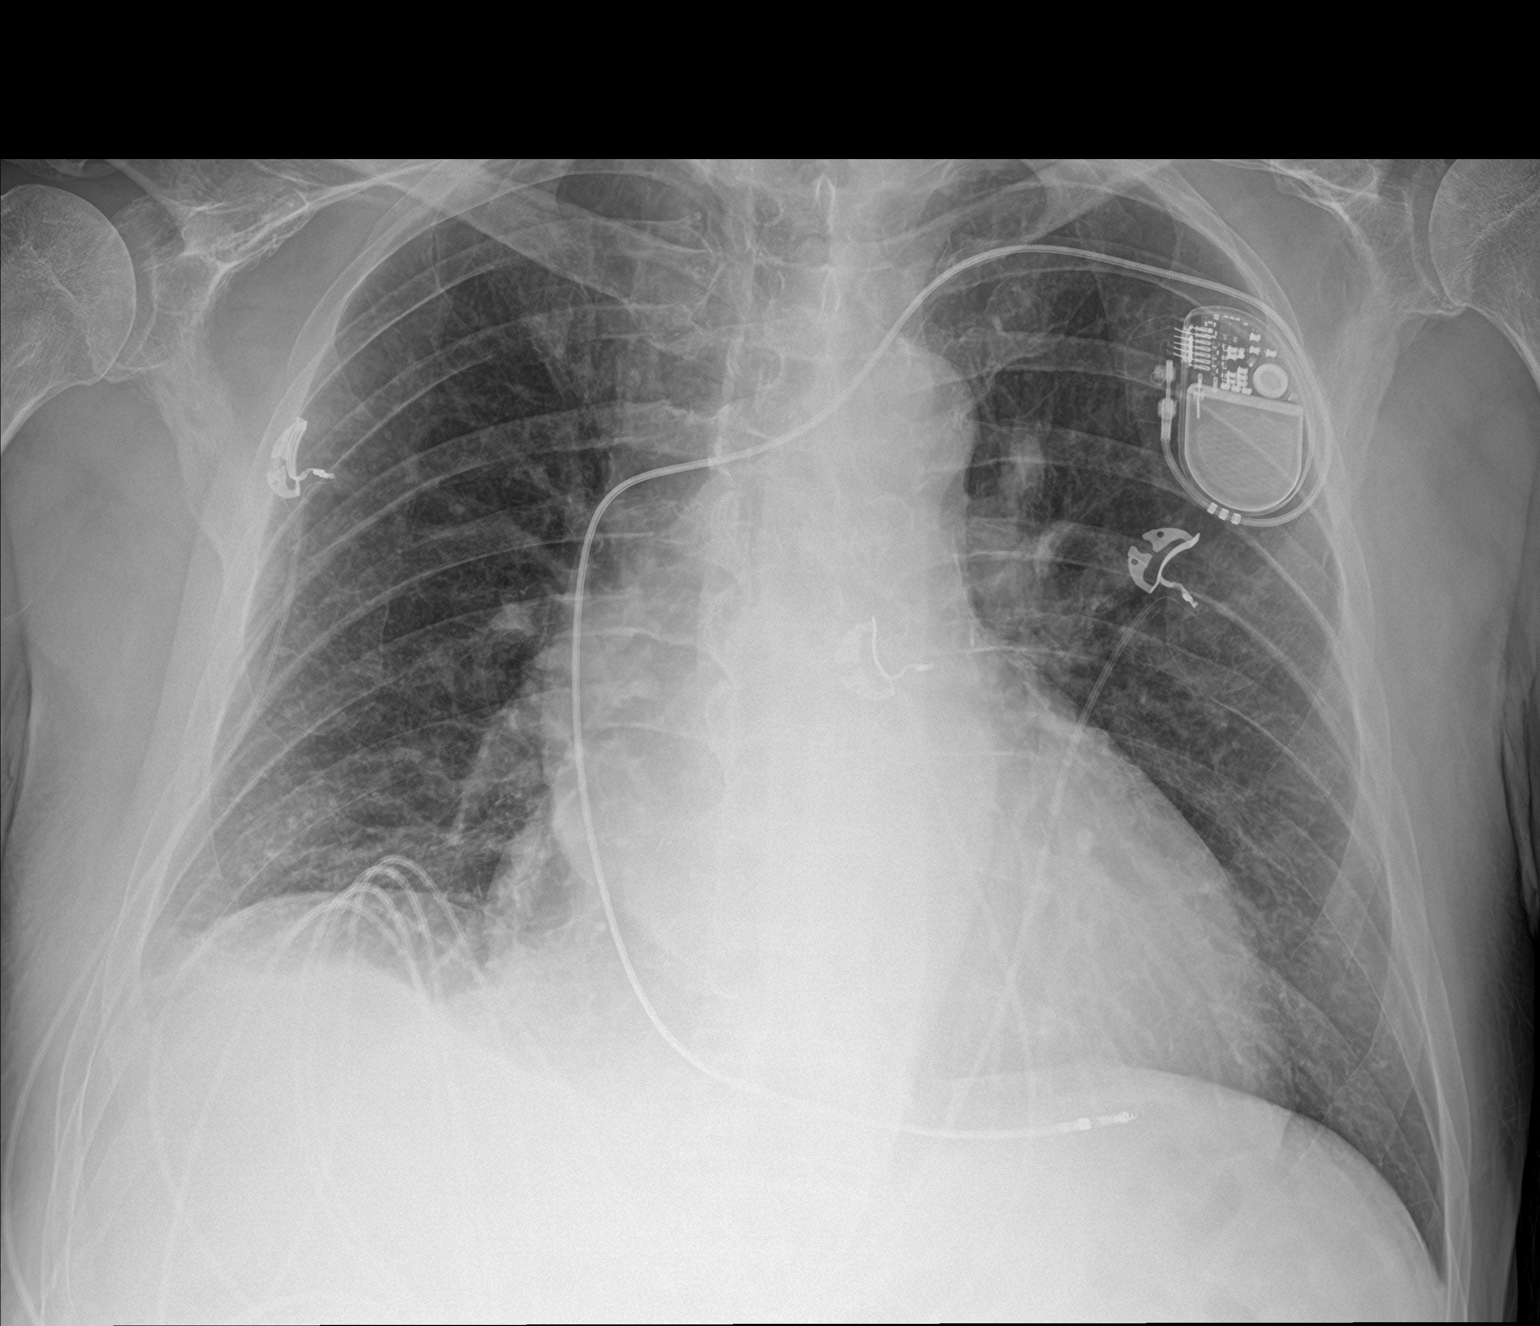

[2 of 2 positions shown; findings below may reference images not displayed]

FINDINGS: A single lead pacemaker remains in place. The cardiac silhouette
remains mildly enlarged. There is slight elevation of the right
hemidiaphragm with mild right basilar lung opacity and suspected
small right pleural effusion. The left lung is clear. No
pneumothorax is identified. No acute osseous abnormality is seen.
IMPRESSION: Mild right basilar opacity likely reflecting atelectasis and a small
pleural effusion.

## 2020-06-05 DIAGNOSIS — I10 Essential (primary) hypertension: Secondary | ICD-10-CM | POA: Diagnosis not present

## 2020-06-05 DIAGNOSIS — Z0000 Encounter for general adult medical examination without abnormal findings: Secondary | ICD-10-CM | POA: Diagnosis not present

## 2020-06-05 DIAGNOSIS — I509 Heart failure, unspecified: Secondary | ICD-10-CM | POA: Diagnosis not present

## 2020-06-05 DIAGNOSIS — I4891 Unspecified atrial fibrillation: Secondary | ICD-10-CM | POA: Diagnosis not present

## 2020-06-05 DIAGNOSIS — I4892 Unspecified atrial flutter: Secondary | ICD-10-CM | POA: Diagnosis not present

## 2020-06-05 DIAGNOSIS — E119 Type 2 diabetes mellitus without complications: Secondary | ICD-10-CM | POA: Diagnosis not present

## 2020-06-05 DIAGNOSIS — I110 Hypertensive heart disease with heart failure: Secondary | ICD-10-CM | POA: Diagnosis not present

## 2020-06-05 DIAGNOSIS — E782 Mixed hyperlipidemia: Secondary | ICD-10-CM | POA: Diagnosis not present

## 2020-06-24 DIAGNOSIS — E119 Type 2 diabetes mellitus without complications: Secondary | ICD-10-CM | POA: Diagnosis not present

## 2020-06-24 DIAGNOSIS — B351 Tinea unguium: Secondary | ICD-10-CM | POA: Diagnosis not present

## 2020-06-24 DIAGNOSIS — I4891 Unspecified atrial fibrillation: Secondary | ICD-10-CM | POA: Diagnosis not present

## 2020-06-24 DIAGNOSIS — I4892 Unspecified atrial flutter: Secondary | ICD-10-CM | POA: Diagnosis not present

## 2020-07-03 ENCOUNTER — Ambulatory Visit (INDEPENDENT_AMBULATORY_CARE_PROVIDER_SITE_OTHER): Payer: Medicare HMO

## 2020-07-03 DIAGNOSIS — I480 Paroxysmal atrial fibrillation: Secondary | ICD-10-CM | POA: Diagnosis not present

## 2020-07-04 LAB — CUP PACEART REMOTE DEVICE CHECK
Battery Remaining Longevity: 94 mo
Battery Remaining Percentage: 95.5 %
Battery Voltage: 2.99 V
Brady Statistic RV Percent Paced: 88 %
Date Time Interrogation Session: 20220602054210
Implantable Lead Implant Date: 20200221
Implantable Lead Location: 753860
Implantable Pulse Generator Implant Date: 20200221
Lead Channel Impedance Value: 590 Ohm
Lead Channel Pacing Threshold Amplitude: 0.75 V
Lead Channel Pacing Threshold Pulse Width: 0.5 ms
Lead Channel Sensing Intrinsic Amplitude: 12 mV
Lead Channel Setting Pacing Amplitude: 3.5 V
Lead Channel Setting Pacing Pulse Width: 0.5 ms
Lead Channel Setting Sensing Sensitivity: 2 mV
Pulse Gen Model: 1272
Pulse Gen Serial Number: 9114203

## 2020-07-12 DIAGNOSIS — E083293 Diabetes mellitus due to underlying condition with mild nonproliferative diabetic retinopathy without macular edema, bilateral: Secondary | ICD-10-CM | POA: Diagnosis not present

## 2020-07-12 DIAGNOSIS — H2513 Age-related nuclear cataract, bilateral: Secondary | ICD-10-CM | POA: Diagnosis not present

## 2020-07-12 DIAGNOSIS — H524 Presbyopia: Secondary | ICD-10-CM | POA: Diagnosis not present

## 2020-07-12 DIAGNOSIS — E113293 Type 2 diabetes mellitus with mild nonproliferative diabetic retinopathy without macular edema, bilateral: Secondary | ICD-10-CM | POA: Diagnosis not present

## 2020-07-23 DIAGNOSIS — I4892 Unspecified atrial flutter: Secondary | ICD-10-CM | POA: Diagnosis not present

## 2020-07-23 DIAGNOSIS — I4891 Unspecified atrial fibrillation: Secondary | ICD-10-CM | POA: Diagnosis not present

## 2020-07-26 NOTE — Progress Notes (Signed)
Remote pacemaker transmission.   

## 2020-08-22 DIAGNOSIS — I4892 Unspecified atrial flutter: Secondary | ICD-10-CM | POA: Diagnosis not present

## 2020-08-22 DIAGNOSIS — I4891 Unspecified atrial fibrillation: Secondary | ICD-10-CM | POA: Diagnosis not present

## 2020-09-24 DIAGNOSIS — E119 Type 2 diabetes mellitus without complications: Secondary | ICD-10-CM | POA: Diagnosis not present

## 2020-09-24 DIAGNOSIS — B351 Tinea unguium: Secondary | ICD-10-CM | POA: Diagnosis not present

## 2020-09-24 DIAGNOSIS — I4892 Unspecified atrial flutter: Secondary | ICD-10-CM | POA: Diagnosis not present

## 2020-09-24 DIAGNOSIS — I4891 Unspecified atrial fibrillation: Secondary | ICD-10-CM | POA: Diagnosis not present

## 2020-10-03 ENCOUNTER — Ambulatory Visit (INDEPENDENT_AMBULATORY_CARE_PROVIDER_SITE_OTHER): Payer: Medicare HMO

## 2020-10-03 DIAGNOSIS — I480 Paroxysmal atrial fibrillation: Secondary | ICD-10-CM | POA: Diagnosis not present

## 2020-10-08 LAB — CUP PACEART REMOTE DEVICE CHECK
Battery Remaining Longevity: 71 mo
Battery Remaining Percentage: 78 %
Battery Voltage: 2.99 V
Brady Statistic RV Percent Paced: 89 %
Date Time Interrogation Session: 20220901022524
Implantable Lead Implant Date: 20200221
Implantable Lead Location: 753860
Implantable Pulse Generator Implant Date: 20200221
Lead Channel Impedance Value: 590 Ohm
Lead Channel Pacing Threshold Amplitude: 0.75 V
Lead Channel Pacing Threshold Pulse Width: 0.5 ms
Lead Channel Sensing Intrinsic Amplitude: 12 mV
Lead Channel Setting Pacing Amplitude: 3.5 V
Lead Channel Setting Pacing Pulse Width: 0.5 ms
Lead Channel Setting Sensing Sensitivity: 2 mV
Pulse Gen Model: 1272
Pulse Gen Serial Number: 9114203

## 2020-10-15 NOTE — Progress Notes (Signed)
Remote pacemaker transmission.   

## 2020-10-25 DIAGNOSIS — I4891 Unspecified atrial fibrillation: Secondary | ICD-10-CM | POA: Diagnosis not present

## 2020-10-25 DIAGNOSIS — I4892 Unspecified atrial flutter: Secondary | ICD-10-CM | POA: Diagnosis not present

## 2020-11-22 DIAGNOSIS — I4891 Unspecified atrial fibrillation: Secondary | ICD-10-CM | POA: Diagnosis not present

## 2020-11-22 DIAGNOSIS — I4892 Unspecified atrial flutter: Secondary | ICD-10-CM | POA: Diagnosis not present

## 2020-12-05 DIAGNOSIS — E119 Type 2 diabetes mellitus without complications: Secondary | ICD-10-CM | POA: Diagnosis not present

## 2020-12-12 DIAGNOSIS — I10 Essential (primary) hypertension: Secondary | ICD-10-CM | POA: Diagnosis not present

## 2020-12-12 DIAGNOSIS — Z0001 Encounter for general adult medical examination with abnormal findings: Secondary | ICD-10-CM | POA: Diagnosis not present

## 2020-12-12 DIAGNOSIS — B351 Tinea unguium: Secondary | ICD-10-CM | POA: Diagnosis not present

## 2020-12-12 DIAGNOSIS — E782 Mixed hyperlipidemia: Secondary | ICD-10-CM | POA: Diagnosis not present

## 2020-12-12 DIAGNOSIS — I4891 Unspecified atrial fibrillation: Secondary | ICD-10-CM | POA: Diagnosis not present

## 2020-12-12 DIAGNOSIS — E119 Type 2 diabetes mellitus without complications: Secondary | ICD-10-CM | POA: Diagnosis not present

## 2020-12-20 DIAGNOSIS — I4892 Unspecified atrial flutter: Secondary | ICD-10-CM | POA: Diagnosis not present

## 2020-12-20 DIAGNOSIS — I4891 Unspecified atrial fibrillation: Secondary | ICD-10-CM | POA: Diagnosis not present

## 2021-01-02 ENCOUNTER — Ambulatory Visit (INDEPENDENT_AMBULATORY_CARE_PROVIDER_SITE_OTHER): Payer: Medicare HMO

## 2021-01-02 DIAGNOSIS — I480 Paroxysmal atrial fibrillation: Secondary | ICD-10-CM | POA: Diagnosis not present

## 2021-01-02 LAB — CUP PACEART REMOTE DEVICE CHECK
Battery Remaining Longevity: 68 mo
Battery Remaining Percentage: 75 %
Battery Voltage: 2.98 V
Brady Statistic RV Percent Paced: 89 %
Date Time Interrogation Session: 20221201020012
Implantable Lead Implant Date: 20200221
Implantable Lead Location: 753860
Implantable Pulse Generator Implant Date: 20200221
Lead Channel Impedance Value: 590 Ohm
Lead Channel Pacing Threshold Amplitude: 0.75 V
Lead Channel Pacing Threshold Pulse Width: 0.5 ms
Lead Channel Sensing Intrinsic Amplitude: 12 mV
Lead Channel Setting Pacing Amplitude: 3.5 V
Lead Channel Setting Pacing Pulse Width: 0.5 ms
Lead Channel Setting Sensing Sensitivity: 2 mV
Pulse Gen Model: 1272
Pulse Gen Serial Number: 9114203

## 2021-01-09 DIAGNOSIS — H903 Sensorineural hearing loss, bilateral: Secondary | ICD-10-CM | POA: Diagnosis not present

## 2021-01-13 NOTE — Progress Notes (Signed)
Remote pacemaker transmission.   

## 2021-01-16 DIAGNOSIS — H903 Sensorineural hearing loss, bilateral: Secondary | ICD-10-CM | POA: Diagnosis not present

## 2021-01-17 DIAGNOSIS — I4892 Unspecified atrial flutter: Secondary | ICD-10-CM | POA: Diagnosis not present

## 2021-01-17 DIAGNOSIS — I4891 Unspecified atrial fibrillation: Secondary | ICD-10-CM | POA: Diagnosis not present

## 2021-02-20 DIAGNOSIS — I4891 Unspecified atrial fibrillation: Secondary | ICD-10-CM | POA: Diagnosis not present

## 2021-02-20 DIAGNOSIS — I4892 Unspecified atrial flutter: Secondary | ICD-10-CM | POA: Diagnosis not present

## 2021-03-14 DIAGNOSIS — B351 Tinea unguium: Secondary | ICD-10-CM | POA: Diagnosis not present

## 2021-03-14 DIAGNOSIS — E119 Type 2 diabetes mellitus without complications: Secondary | ICD-10-CM | POA: Diagnosis not present

## 2021-04-03 ENCOUNTER — Ambulatory Visit (INDEPENDENT_AMBULATORY_CARE_PROVIDER_SITE_OTHER): Payer: Medicare HMO

## 2021-04-03 DIAGNOSIS — I480 Paroxysmal atrial fibrillation: Secondary | ICD-10-CM

## 2021-04-03 LAB — CUP PACEART REMOTE DEVICE CHECK
Battery Remaining Longevity: 66 mo
Battery Remaining Percentage: 72 %
Battery Voltage: 2.98 V
Brady Statistic RV Percent Paced: 89 %
Date Time Interrogation Session: 20230302020016
Implantable Lead Implant Date: 20200221
Implantable Lead Location: 753860
Implantable Pulse Generator Implant Date: 20200221
Lead Channel Impedance Value: 590 Ohm
Lead Channel Pacing Threshold Amplitude: 0.75 V
Lead Channel Pacing Threshold Pulse Width: 0.5 ms
Lead Channel Sensing Intrinsic Amplitude: 12 mV
Lead Channel Setting Pacing Amplitude: 3.5 V
Lead Channel Setting Pacing Pulse Width: 0.5 ms
Lead Channel Setting Sensing Sensitivity: 2 mV
Pulse Gen Model: 1272
Pulse Gen Serial Number: 9114203

## 2021-04-09 DIAGNOSIS — I4892 Unspecified atrial flutter: Secondary | ICD-10-CM | POA: Diagnosis not present

## 2021-04-09 DIAGNOSIS — I4891 Unspecified atrial fibrillation: Secondary | ICD-10-CM | POA: Diagnosis not present

## 2021-04-10 NOTE — Progress Notes (Signed)
Remote pacemaker transmission.   

## 2021-04-18 DIAGNOSIS — I4891 Unspecified atrial fibrillation: Secondary | ICD-10-CM | POA: Diagnosis not present

## 2021-04-18 DIAGNOSIS — I4892 Unspecified atrial flutter: Secondary | ICD-10-CM | POA: Diagnosis not present

## 2021-05-02 DIAGNOSIS — I4891 Unspecified atrial fibrillation: Secondary | ICD-10-CM | POA: Diagnosis not present

## 2021-05-02 DIAGNOSIS — I4892 Unspecified atrial flutter: Secondary | ICD-10-CM | POA: Diagnosis not present

## 2021-05-02 DIAGNOSIS — Z7901 Long term (current) use of anticoagulants: Secondary | ICD-10-CM | POA: Diagnosis not present

## 2021-05-19 ENCOUNTER — Emergency Department: Payer: Medicare HMO

## 2021-05-19 ENCOUNTER — Inpatient Hospital Stay
Admission: EM | Admit: 2021-05-19 | Discharge: 2021-05-21 | DRG: 871 | Disposition: A | Discharge status: Home Health | Payer: Medicare HMO | Attending: Internal Medicine | Admitting: Internal Medicine

## 2021-05-19 ENCOUNTER — Other Ambulatory Visit: Payer: Self-pay

## 2021-05-19 ENCOUNTER — Encounter: Payer: Self-pay | Admitting: *Deleted

## 2021-05-19 DIAGNOSIS — I480 Paroxysmal atrial fibrillation: Secondary | ICD-10-CM | POA: Diagnosis not present

## 2021-05-19 DIAGNOSIS — D649 Anemia, unspecified: Secondary | ICD-10-CM | POA: Diagnosis present

## 2021-05-19 DIAGNOSIS — I248 Other forms of acute ischemic heart disease: Secondary | ICD-10-CM | POA: Diagnosis not present

## 2021-05-19 DIAGNOSIS — Z87891 Personal history of nicotine dependence: Secondary | ICD-10-CM

## 2021-05-19 DIAGNOSIS — I110 Hypertensive heart disease with heart failure: Secondary | ICD-10-CM | POA: Diagnosis not present

## 2021-05-19 DIAGNOSIS — Z950 Presence of cardiac pacemaker: Secondary | ICD-10-CM

## 2021-05-19 DIAGNOSIS — Z79890 Hormone replacement therapy (postmenopausal): Secondary | ICD-10-CM

## 2021-05-19 DIAGNOSIS — Z823 Family history of stroke: Secondary | ICD-10-CM

## 2021-05-19 DIAGNOSIS — E782 Mixed hyperlipidemia: Secondary | ICD-10-CM | POA: Diagnosis present

## 2021-05-19 DIAGNOSIS — R6520 Severe sepsis without septic shock: Secondary | ICD-10-CM | POA: Diagnosis not present

## 2021-05-19 DIAGNOSIS — R0602 Shortness of breath: Secondary | ICD-10-CM | POA: Diagnosis not present

## 2021-05-19 DIAGNOSIS — J9601 Acute respiratory failure with hypoxia: Secondary | ICD-10-CM | POA: Diagnosis not present

## 2021-05-19 DIAGNOSIS — I10 Essential (primary) hypertension: Secondary | ICD-10-CM | POA: Diagnosis present

## 2021-05-19 DIAGNOSIS — Z20822 Contact with and (suspected) exposure to covid-19: Secondary | ICD-10-CM | POA: Diagnosis present

## 2021-05-19 DIAGNOSIS — I5032 Chronic diastolic (congestive) heart failure: Secondary | ICD-10-CM | POA: Diagnosis present

## 2021-05-19 DIAGNOSIS — Z7901 Long term (current) use of anticoagulants: Secondary | ICD-10-CM | POA: Diagnosis not present

## 2021-05-19 DIAGNOSIS — Z888 Allergy status to other drugs, medicaments and biological substances status: Secondary | ICD-10-CM

## 2021-05-19 DIAGNOSIS — E119 Type 2 diabetes mellitus without complications: Secondary | ICD-10-CM

## 2021-05-19 DIAGNOSIS — J189 Pneumonia, unspecified organism: Secondary | ICD-10-CM

## 2021-05-19 DIAGNOSIS — F0390 Unspecified dementia without behavioral disturbance: Secondary | ICD-10-CM | POA: Diagnosis not present

## 2021-05-19 DIAGNOSIS — N179 Acute kidney failure, unspecified: Secondary | ICD-10-CM | POA: Diagnosis not present

## 2021-05-19 DIAGNOSIS — R791 Abnormal coagulation profile: Secondary | ICD-10-CM | POA: Diagnosis present

## 2021-05-19 DIAGNOSIS — Z8052 Family history of malignant neoplasm of bladder: Secondary | ICD-10-CM

## 2021-05-19 DIAGNOSIS — I517 Cardiomegaly: Secondary | ICD-10-CM | POA: Diagnosis not present

## 2021-05-19 DIAGNOSIS — E860 Dehydration: Secondary | ICD-10-CM | POA: Diagnosis present

## 2021-05-19 DIAGNOSIS — J181 Lobar pneumonia, unspecified organism: Secondary | ICD-10-CM | POA: Diagnosis not present

## 2021-05-19 DIAGNOSIS — Z79899 Other long term (current) drug therapy: Secondary | ICD-10-CM

## 2021-05-19 DIAGNOSIS — E1165 Type 2 diabetes mellitus with hyperglycemia: Secondary | ICD-10-CM | POA: Diagnosis present

## 2021-05-19 DIAGNOSIS — R7989 Other specified abnormal findings of blood chemistry: Secondary | ICD-10-CM | POA: Diagnosis not present

## 2021-05-19 DIAGNOSIS — E785 Hyperlipidemia, unspecified: Secondary | ICD-10-CM | POA: Diagnosis not present

## 2021-05-19 DIAGNOSIS — A419 Sepsis, unspecified organism: Principal | ICD-10-CM | POA: Diagnosis present

## 2021-05-19 LAB — BASIC METABOLIC PANEL
Anion gap: 12 (ref 5–15)
BUN: 48 mg/dL — ABNORMAL HIGH (ref 8–23)
CO2: 26 mmol/L (ref 22–32)
Calcium: 8.8 mg/dL — ABNORMAL LOW (ref 8.9–10.3)
Chloride: 98 mmol/L (ref 98–111)
Creatinine, Ser: 1.26 mg/dL — ABNORMAL HIGH (ref 0.61–1.24)
GFR, Estimated: 56 mL/min — ABNORMAL LOW (ref >60)
Glucose, Bld: 354 mg/dL — ABNORMAL HIGH (ref 70–99)
Potassium: 4 mmol/L (ref 3.5–5.1)
Sodium: 136 mmol/L (ref 135–145)

## 2021-05-19 LAB — CBC
HCT: 33.5 % — ABNORMAL LOW (ref 39.0–52.0)
Hemoglobin: 10.6 g/dL — ABNORMAL LOW (ref 13.0–17.0)
MCH: 30.6 pg (ref 26.0–34.0)
MCHC: 31.6 g/dL (ref 30.0–36.0)
MCV: 96.8 fL (ref 80.0–100.0)
Platelets: 171 10*3/uL (ref 150–400)
RBC: 3.46 MIL/uL — ABNORMAL LOW (ref 4.22–5.81)
RDW: 13.3 % (ref 11.5–15.5)
WBC: 12.1 10*3/uL — ABNORMAL HIGH (ref 4.0–10.5)
nRBC: 0 % (ref 0.0–0.2)

## 2021-05-19 LAB — HEPATIC FUNCTION PANEL
ALT: 13 U/L (ref 0–44)
AST: 25 U/L (ref 15–41)
Albumin: 2.8 g/dL — ABNORMAL LOW (ref 3.5–5.0)
Alkaline Phosphatase: 63 U/L (ref 38–126)
Bilirubin, Direct: <0.1 mg/dL (ref 0.0–0.2)
Total Bilirubin: 0.6 mg/dL (ref 0.3–1.2)
Total Protein: 6 g/dL — ABNORMAL LOW (ref 6.5–8.1)

## 2021-05-19 LAB — LACTIC ACID, PLASMA: Lactic Acid, Venous: 3.8 mmol/L (ref 0.5–1.9)

## 2021-05-19 LAB — RESP PANEL BY RT-PCR (FLU A&B, COVID) ARPGX2
Influenza A by PCR: NEGATIVE
Influenza B by PCR: NEGATIVE
SARS Coronavirus 2 by RT PCR: NEGATIVE

## 2021-05-19 LAB — TROPONIN I (HIGH SENSITIVITY): Troponin I (High Sensitivity): 186 ng/L (ref <18)

## 2021-05-19 LAB — PROTIME-INR
INR: 6.1 (ref 0.8–1.2)
Prothrombin Time: 53.5 seconds — ABNORMAL HIGH (ref 11.4–15.2)

## 2021-05-19 LAB — PROCALCITONIN: Procalcitonin: 0.42 ng/mL

## 2021-05-19 MED ORDER — PHYTONADIONE 5 MG PO TABS
2.5000 mg | ORAL_TABLET | Freq: Once | ORAL | Status: AC
Start: 2021-05-19 — End: 2021-05-20
  Administered 2021-05-20: 2.5 mg via ORAL
  Filled 2021-05-19: qty 1

## 2021-05-19 MED ORDER — SODIUM CHLORIDE 0.9 % IV SOLN
500.0000 mg | INTRAVENOUS | Status: DC
  Administered 2021-05-20: 500 mg via INTRAVENOUS
  Filled 2021-05-19 (×2): qty 5

## 2021-05-19 MED ORDER — HYDRALAZINE HCL 20 MG/ML IJ SOLN
5.0000 mg | INTRAMUSCULAR | Status: DC | PRN
Start: 2021-05-19 — End: 2021-05-21

## 2021-05-19 MED ORDER — GUAIFENESIN ER 600 MG PO TB12: 600.0000 mg | ORAL_TABLET | Freq: Two times a day (BID) | ORAL | Status: DC | PRN

## 2021-05-19 MED ORDER — HYDROCODONE-ACETAMINOPHEN 5-325 MG PO TABS: 1.0000 | ORAL_TABLET | ORAL | Status: DC | PRN

## 2021-05-19 MED ORDER — SODIUM CHLORIDE 0.9 % IV BOLUS
1000.0000 mL | Freq: Once | INTRAVENOUS | Status: AC
  Administered 2021-05-19: 1000 mL via INTRAVENOUS

## 2021-05-19 MED ORDER — BISACODYL 5 MG PO TBEC: 5.0000 mg | DELAYED_RELEASE_TABLET | Freq: Every day | ORAL | Status: DC | PRN

## 2021-05-19 MED ORDER — ACETAMINOPHEN 325 MG PO TABS: 650.0000 mg | ORAL_TABLET | Freq: Four times a day (QID) | ORAL | Status: DC | PRN

## 2021-05-19 MED ORDER — DOCUSATE SODIUM 100 MG PO CAPS
100.0000 mg | ORAL_CAPSULE | Freq: Two times a day (BID) | ORAL | Status: DC
  Administered 2021-05-20 – 2021-05-21 (×3): 100 mg via ORAL
  Filled 2021-05-19 (×3): qty 1

## 2021-05-19 MED ORDER — POLYETHYLENE GLYCOL 3350 17 G PO PACK: 17.0000 g | PACK | Freq: Every day | ORAL | Status: DC | PRN

## 2021-05-19 MED ORDER — ACETAMINOPHEN 650 MG RE SUPP: 650.0000 mg | Freq: Four times a day (QID) | RECTAL | Status: DC | PRN

## 2021-05-19 MED ORDER — ASPIRIN 81 MG PO CHEW
324.0000 mg | CHEWABLE_TABLET | Freq: Once | ORAL | Status: DC
  Filled 2021-05-19: qty 4

## 2021-05-19 MED ORDER — ALBUTEROL SULFATE (2.5 MG/3ML) 0.083% IN NEBU: 2.5000 mg | INHALATION_SOLUTION | RESPIRATORY_TRACT | Status: DC | PRN

## 2021-05-19 MED ORDER — VANCOMYCIN HCL 1500 MG/300ML IV SOLN: 1500.0000 mg | Freq: Once | INTRAVENOUS | Status: DC

## 2021-05-19 MED ORDER — MORPHINE SULFATE (PF) 2 MG/ML IV SOLN: 2.0000 mg | INTRAVENOUS | Status: DC | PRN

## 2021-05-19 MED ORDER — SODIUM CHLORIDE 0.9 % IV SOLN
2.0000 g | INTRAVENOUS | Status: DC
  Administered 2021-05-20: 2 g via INTRAVENOUS
  Filled 2021-05-19 (×2): qty 20

## 2021-05-19 MED ORDER — SODIUM CHLORIDE 0.9 % IV SOLN
2.0000 g | Freq: Once | INTRAVENOUS | Status: AC
  Administered 2021-05-20: 2 g via INTRAVENOUS
  Filled 2021-05-19: qty 20

## 2021-05-19 MED ORDER — SODIUM CHLORIDE 0.9 % IV SOLN
1.0000 g | Freq: Once | INTRAVENOUS | Status: DC
  Filled 2021-05-19: qty 10

## 2021-05-19 MED ORDER — SODIUM CHLORIDE 0.9 % IV SOLN
500.0000 mg | Freq: Once | INTRAVENOUS | Status: AC
  Administered 2021-05-20: 500 mg via INTRAVENOUS
  Filled 2021-05-19: qty 5

## 2021-05-19 MED ORDER — SODIUM CHLORIDE 0.9 % IV SOLN: INTRAVENOUS | Status: AC

## 2021-05-19 NOTE — ED Triage Notes (Addendum)
Pt has sob, fever, chills today.  Pt also reports weakness today.  No n/v/d.   Pt placed on 2 liters oxygen in triage.  Hx chf, pacemaker    no chest pain.   Pt alert  speech clear.  ?

## 2021-05-19 NOTE — ED Provider Notes (Signed)
? ?[facility] ?Provider Note ? ? ? Event Date/Time  ? First MD Initiated Contact with Patient 05/19/21 2124   ?  (approximate) ? ? ?History  ? ?Shortness of Breath ? ? ?HPI ? ?Andrew [name] is a 86 y.o. male with past medical history of atrial fibrillation, hyperlipidemia, hypertension, diabetes on Coumadin who presents with cough and fever.  Patient's wife notes that he had upper respiratory symptoms several days last week overall she felt he was improving over the last couple days.  Until this afternoon when he developed what sounds like rigors with shivering and had a fever.  Has also been coughing today.  Has been somewhat confused as well.  Has not had any nausea vomiting abdominal pain.  No chest pain. ? ?  ? ?Past Medical History:  ?Diagnosis Date  ? CHF (congestive heart failure) (HCC)   ? Diabetes mellitus without complication (HCC)   ? Essential hypertension   ? Mixed hyperlipidemia   ? Permanent atrial fibrillation (HCC)   ? a. s/p catheter ablation ~ 12 yrs ago @ [facility] per family->unsuccessful; b. CHA2DS2VASc = 5-->chronic coumadin.  ? ? ?Patient Active Problem List  ? Diagnosis Date Noted  ? Sepsis (HCC) 10/06/2018  ? Chronic diastolic (congestive) heart failure (HCC) 04/01/2018  ? HTN (hypertension) 04/01/2018  ? DM (diabetes mellitus) (HCC) 04/01/2018  ? Paroxysmal atrial fibrillation (HCC) 04/01/2018  ? Symptomatic bradycardia 03/25/2018  ? Acute CHF (congestive heart failure) (HCC) 03/23/2018  ? ? ? ?Physical Exam  ?Triage Vital Signs: ?ED Triage Vitals  ?Enc Vitals Group  ?   BP 05/19/21 2111 (!) 107/50  ?   Pulse Rate 05/19/21 2111 (!) 59  ?   Resp 05/19/21 2111 (!) 34  ?   Temp 05/19/21 2111 99.4 ?F (37.4 ?C)  ?   Temp src --   ?   SpO2 05/19/21 2111 (!) 86 %  ?   Weight 05/19/21 2113 170 lb (77.1 kg)  ?   Height 05/19/21 2113 5\' 8"  (1.727 m)  ?   Head Circumference --   ?   Peak Flow --   ?   Pain Score 05/19/21 2113 0  ?   Pain Loc --   ?   Pain Edu? --   ?   Excl.  in GC? --   ? ? ?Most recent vital signs: ?Vitals:  ? 05/19/21 2130 05/19/21 2215  ?BP: (!) 118/53 (!) 103/52  ?Pulse: (!) 59 60  ?Resp: 17 19  ?Temp:    ?SpO2: 93% 95%  ? ? ? ?General: Awake, no distress.  ?CV:  Good peripheral perfusion.  ?Resp:  Normal effort.  No increased work of breathing, rhonchi throughout ?Abd:  No distention.  Abdomen is soft and nontender ?Neuro:             Awake, Alert, Oriented x 3 patient is hard of hearing ?Other:   ? ? ?ED Results / Procedures / Treatments  ?Labs ?(all labs ordered are listed, but only abnormal results are displayed) ?Labs Reviewed  ?BASIC METABOLIC PANEL - Abnormal; Notable for the following components:  ?    Result Value  ? Glucose, Bld 354 (*)   ? BUN 48 (*)   ? Creatinine, Ser 1.26 (*)   ? Calcium 8.8 (*)   ? GFR, Estimated 56 (*)   ? All other components within normal limits  ?CBC - Abnormal; Notable for the following components:  ? WBC 12.1 (*)   ?  RBC 3.46 (*)   ? Hemoglobin 10.6 (*)   ? HCT 33.5 (*)   ? All other components within normal limits  ?TROPONIN I (HIGH SENSITIVITY) - Abnormal; Notable for the following components:  ? Troponin I (High Sensitivity) 186 (*)   ? All other components within normal limits  ?CULTURE, BLOOD (ROUTINE X 2)  ?CULTURE, BLOOD (ROUTINE X 2)  ?RESP PANEL BY RT-PCR (FLU A&B, COVID) [id number]  ?LACTIC ACID, PLASMA  ?LACTIC ACID, PLASMA  ?PROCALCITONIN  ?HEPATIC FUNCTION PANEL  ?PROTIME-INR  ?URINALYSIS, COMPLETE (UACMP) WITH MICROSCOPIC  ? ? ? ?EKG ? ?EKG interpreted by myself, ventricular paced rhythm, no Sgarbossa criteria to suggest ischemia ? ? ?RADIOLOGY ?I reviewed the x-ray which shows a developing left lower lobe infiltrate ? ? ?PROCEDURES: ? ?Critical Care performed: No ? ?Procedures ? ?The patient is on the cardiac monitor to evaluate for evidence of arrhythmia and/or significant heart rate changes. ? ? ?MEDICATIONS ORDERED IN ED: ?Medications  ?sodium chloride 0.9 % bolus 1,000 mL (has no administration in time range)   ?cefTRIAXone (ROCEPHIN) 1 g in sodium chloride 0.9 % 100 mL IVPB (has no administration in time range)  ?azithromycin (ZITHROMAX) 500 mg in sodium chloride 0.9 % 250 mL IVPB (has no administration in time range)  ? ? ? ?IMPRESSION / MDM / ASSESSMENT AND PLAN / ED COURSE  ?I reviewed the triage vital signs and the nursing notes. ?             ?               ? ?Differential diagnosis includes, but is not limited to, viral syndrome, community-acquired pneumonia, CHF, UTI ? ?The patient is an 86 year old male who presents with what sounds like rigors at home fever.  On arrival patient is satting in the mid 80s on room air, placed on 3 L nasal cannula for saturations around 95%.  He is tachypneic with temp of 99.4.  Feels warm to me I suspect that rectal temp would likely show fever.  He has a leukocytosis of 12 mild AKI and elevated blood sugar but no signs of DKA including no anion gap and normal bicarb.  Troponin is elevated at 196.  Lactate Pro-Cal blood cultures and hepatic function panel and urine are still pending at this time.  Patient had what sounds to me like a viral illness for several days last week which initially improved and then worsened today.  His wife describes him as shivering today and was more globally weak and somewhat confused.  Patient denies complaints at this time including abdominal pain chest pain or shortness of breath.  Does have rhonchi throughout.  Chest x-ray shows a developing left lower lobe infiltrate.  Presentation is consistent with community-acquired pneumonia.  Will cover with ceftriaxone and azithromycin. ? ?  ? ? ?FINAL CLINICAL IMPRESSION(S) / ED DIAGNOSES  ? ?Final diagnoses:  ?Community acquired pneumonia, unspecified laterality  ? ? ? ?Rx / DC Orders  ? ?ED Discharge Orders   ? ? None  ? ?  ? ? ? ?Note:  This document was prepared using Dragon voice recognition software and may include unintentional dictation errors. ?  ?Georga Hacking, MD ?05/19/21 2223 ? ?

## 2021-05-19 NOTE — H&P (Addendum)
?History and Physical  ? ? ?Patient: Andrew [name] TGY:[id number] DOB: 1935-05-15 ?DOA: 05/19/2021 ?DOS: the patient was seen and examined on 05/20/2021 ?PCP: Gracelyn Nurse, MD  ?Patient coming from: Home ? ?Chief Complaint:  ?Chief Complaint  ?Patient presents with  ? Shortness of Breath  ? ?HPI: Andrew [name] is a 86 y.o. male with medical history significant of congestive heart failure, diabetes mellitus type 2, hypertension atrial fibrillation status post ablation about 12 years ago currently on Coumadin, pacemaker placed 2 years ago in [place] by [facility] health cardiology Dr. Okey Dupre.  Patient states he has not seen Dr. Okey Dupre and followed up with his cardiologist, his pacemaker is monitored every 3 months . ?Patient has dementia and is not able to provide any history. ?Wife at bedside also gives history for today but does not know why he is on Coumadin does not know any of his cardiac history. ?Pt has been sob since today mid day.  ?Wife reports shivering /shaking 15 minutes. ?Later then he got a fever.  ?Wife says he was warm.  ?Daughter came over and said to bring him here he was breathing too fast.  ?He has a can and could not get him up out of chair.  ? ?Review of Systems: Review of Systems  ?Constitutional:  Positive for chills and fever.  ?Respiratory:  Positive for cough.   ?     Cold for 10 days.  ?Wife was also with cold and was checked for covid and was negative.   ? ?Past Medical History:  ?Diagnosis Date  ? CHF (congestive heart failure) (HCC)   ? Diabetes mellitus without complication (HCC)   ? Essential hypertension   ? Mixed hyperlipidemia   ? Permanent atrial fibrillation (HCC)   ? a. s/p catheter ablation ~ 12 yrs ago @ [facility] per family->unsuccessful; b. CHA2DS2VASc = 5-->chronic coumadin.  ? ?Past Surgical History:  ?Procedure Laterality Date  ? APPENDECTOMY    ? INSERT / REPLACE / REMOVE PACEMAKER    ? PACEMAKER IMPLANT N/A 03/25/2018  ? Procedure: PACEMAKER IMPLANT;  Surgeon: Hillis Range,  MD;  Location: [facility] INVASIVE CV LAB;  Service: Cardiovascular;  Laterality: N/A;  ? ?Social History:  reports that he has quit smoking. His smoking use included cigarettes. He has a 52.50 pack-year smoking history. He has never used smokeless tobacco. He reports that he does not currently use alcohol. He reports that he does not use drugs. ? ?Allergies  ?Allergen Reactions  ? Methyldopa Nausea Only and Palpitations  ?  Other reaction(s): Unknown ?Other reaction(s): NAUSEA ?'palpitation" ?Other reaction(s): NAUSEA ?Other reaction(s): NAUSEA ?  ? ? ?Family History  ?Problem Relation Age of Onset  ? CVA Mother   ? Bladder Cancer Father   ? ? ?Prior to Admission medications   ?Medication Sig Start Date End Date Taking? Authorizing Provider  ?amLODipine (NORVASC) 5 MG tablet Take 1 tablet (5 mg total) by mouth daily. MUST MAKE APPT FOR FURTHER REFILLS 03/31/19   Delma Freeze, FNP  ?doxazosin (CARDURA) 4 MG tablet Take 4 mg by mouth Nightly. 12/31/16   [provider]  ?doxycycline (MONODOX) 100 MG capsule Take 1 capsule (100 mg total) by mouth 2 (two) times daily. 10/08/18   Mayo, Allyn Kenner, MD  ?furosemide (LASIX) 80 MG tablet Take 80 mg by mouth daily.    [provider]  ?glimepiride (AMARYL) 2 MG tablet Take 4 mg by mouth daily. 08/14/16   [provider]  ?lisinopril (PRINIVIL,ZESTRIL)  40 MG tablet Take 1 tablet (40 mg total) by mouth daily. ?Patient taking differently: Take 40 mg by mouth at bedtime.  03/27/18   Arty Baumgartner, NP  ?metFORMIN (GLUCOPHAGE) 1000 MG tablet Take 1,000 mg by mouth 2 (two) times daily.    [provider]  ?pioglitazone (ACTOS) 15 MG tablet Take 15 mg by mouth daily.     [provider]  ?Potassium 99 MG TABS Take 1.5 tablets by mouth daily.    [provider]  ?simvastatin (ZOCOR) 20 MG tablet Take 20 mg by mouth Nightly. 12/31/16   [provider]  ?vitamin B-12 (CYANOCOBALAMIN) 1000 MCG tablet Take 1,000 mcg by mouth daily.     [provider]  ?warfarin (COUMADIN) 2 MG tablet Take 2 mg by mouth See admin instructions. Take 1 tablet by mouth  Monday-Friday  ?Take 1/2 tablet by mouth Saturday and Sunday.  ?Patient takes in the evening.    [provider]  ? ? ?Physical Exam: ?Vitals:  ? 05/19/21 2330 05/20/21 0000 05/20/21 0030 05/20/21 0100  ?BP: 109/61 (!) 111/57 (!) 116/59 128/73  ?Pulse: 60 (!) 59 65 69  ?Resp: (!) 21 (!) 21 19 17   ?Temp:      ?SpO2: 96% 95% 96% 92%  ?Weight:      ?Height:      ? ?Physical Exam ?Vitals and nursing note reviewed.  ?Constitutional:   ?   General: He is not in acute distress. ?   Appearance: Normal appearance. He is not ill-appearing, toxic-appearing or diaphoretic.  ?HENT:  ?   Head: Normocephalic and atraumatic.  ?   Right Ear: Hearing and external ear normal.  ?   Left Ear: Hearing and external ear normal.  ?   Nose: Nose normal. No nasal deformity.  ?   Mouth/Throat:  ?   Lips: Pink.  ?   Mouth: Mucous membranes are moist.  ?   Tongue: No lesions.  ?   Pharynx: Oropharynx is clear.  ?Eyes:  ?   Extraocular Movements: Extraocular movements intact.  ?   Pupils: Pupils are equal, round, and reactive to light.  ?Neck:  ?   Vascular: No carotid bruit.  ?Cardiovascular:  ?   Rate and Rhythm: Normal rate and regular rhythm.  ?   Pulses: Normal pulses.  ?   Heart sounds: Normal heart sounds.  ?Pulmonary:  ?   Effort: Pulmonary effort is normal.  ?   Breath sounds: Examination of the right-lower field reveals wheezing. Examination of the left-lower field reveals wheezing. Wheezing present.  ?Abdominal:  ?   General: Bowel sounds are normal. There is no distension.  ?   Palpations: Abdomen is soft. There is no mass.  ?   Tenderness: There is no abdominal tenderness. There is no guarding.  ?   Hernia: No hernia is present.  ?Musculoskeletal:  ?   Right lower leg: No edema.  ?   Left lower leg: No edema.  ?Skin: ?   General: Skin is warm.  ?Neurological:  ?   General: No focal deficit present.   ?   Mental Status: He is alert and oriented to person, place, and time.  ?   Cranial Nerves: Cranial nerves 2-12 are intact.  ?   Motor: Motor function is intact.  ?Psychiatric:     ?   Attention and Perception: Attention normal.     ?   Mood and Affect: Mood normal.     ?  Speech: Speech normal.     ?   Behavior: Behavior normal. Behavior is cooperative.     ?   Cognition and Memory: Cognition normal.  ? ? ?Data Reviewed: ?Results for orders placed or performed during the hospital encounter of 05/19/21 (from the past 24 hour(s))  ?Resp Panel by RT-PCR (Flu A&B, Covid) Nasopharyngeal Swab     Status: None  ? Collection Time: 05/19/21  9:22 PM  ? Specimen: Nasopharyngeal Swab; Nasopharyngeal(NP) swabs in vial transport medium  ?Result Value Ref Range  ? SARS Coronavirus 2 by RT PCR NEGATIVE NEGATIVE  ? Influenza A by PCR NEGATIVE NEGATIVE  ? Influenza B by PCR NEGATIVE NEGATIVE  ?Basic metabolic panel     Status: Abnormal  ? Collection Time: 05/19/21  9:23 PM  ?Result Value Ref Range  ? Sodium 136 135 - 145 mmol/L  ? Potassium 4.0 3.5 - 5.1 mmol/L  ? Chloride 98 98 - 111 mmol/L  ? CO2 26 22 - 32 mmol/L  ? Glucose, Bld 354 (H) 70 - 99 mg/dL  ? BUN 48 (H) 8 - 23 mg/dL  ? Creatinine, Ser 1.26 (H) 0.61 - 1.24 mg/dL  ? Calcium 8.8 (L) 8.9 - 10.3 mg/dL  ? GFR, Estimated 56 (L) >60 mL/min  ? Anion gap 12 5 - 15  ?CBC     Status: Abnormal  ? Collection Time: 05/19/21  9:23 PM  ?Result Value Ref Range  ? WBC 12.1 (H) 4.0 - 10.5 K/uL  ? RBC 3.46 (L) 4.22 - 5.81 MIL/uL  ? Hemoglobin 10.6 (L) 13.0 - 17.0 g/dL  ? HCT 33.5 (L) 39.0 - 52.0 %  ? MCV 96.8 80.0 - 100.0 fL  ? MCH 30.6 26.0 - 34.0 pg  ? MCHC 31.6 30.0 - 36.0 g/dL  ? RDW 13.3 11.5 - 15.5 %  ? Platelets 171 150 - 400 K/uL  ? nRBC 0.0 0.0 - 0.2 %  ?Troponin I (High Sensitivity)     Status: Abnormal  ? Collection Time: 05/19/21  9:23 PM  ?Result Value Ref Range  ? Troponin I (High Sensitivity) 186 (HH) <18 ng/L  ?Lactic acid, plasma     Status: Abnormal  ? Collection  Time: 05/19/21 10:18 PM  ?Result Value Ref Range  ? Lactic Acid, Venous 3.8 (HH) 0.5 - 1.9 mmol/L  ?Procalcitonin - Baseline     Status: None  ? Collection Time: 05/19/21 10:18 PM  ?Result Value Ref Ra

## 2021-05-19 NOTE — ED Notes (Signed)
Critical troponin of 186--MD notified and aware ?

## 2021-05-19 NOTE — ED Notes (Signed)
Critical INR of 6--Dr. Sidney Ace notified and aware ?

## 2021-05-19 NOTE — ED Notes (Signed)
Critical Lactic--3.8, Dr. Sidney Ace notified and aware. ?

## 2021-05-19 NOTE — ED Notes (Signed)
Pt return from radiology

## 2021-05-20 DIAGNOSIS — D649 Anemia, unspecified: Secondary | ICD-10-CM | POA: Diagnosis present

## 2021-05-20 DIAGNOSIS — J9601 Acute respiratory failure with hypoxia: Secondary | ICD-10-CM

## 2021-05-20 DIAGNOSIS — Z7901 Long term (current) use of anticoagulants: Secondary | ICD-10-CM | POA: Diagnosis not present

## 2021-05-20 DIAGNOSIS — I10 Essential (primary) hypertension: Secondary | ICD-10-CM | POA: Diagnosis not present

## 2021-05-20 DIAGNOSIS — R0602 Shortness of breath: Secondary | ICD-10-CM | POA: Diagnosis present

## 2021-05-20 DIAGNOSIS — N179 Acute kidney failure, unspecified: Secondary | ICD-10-CM | POA: Diagnosis present

## 2021-05-20 DIAGNOSIS — E782 Mixed hyperlipidemia: Secondary | ICD-10-CM | POA: Diagnosis present

## 2021-05-20 DIAGNOSIS — A419 Sepsis, unspecified organism: Secondary | ICD-10-CM | POA: Diagnosis present

## 2021-05-20 DIAGNOSIS — I5032 Chronic diastolic (congestive) heart failure: Secondary | ICD-10-CM

## 2021-05-20 DIAGNOSIS — R7989 Other specified abnormal findings of blood chemistry: Secondary | ICD-10-CM

## 2021-05-20 DIAGNOSIS — J181 Lobar pneumonia, unspecified organism: Secondary | ICD-10-CM | POA: Diagnosis present

## 2021-05-20 DIAGNOSIS — Z888 Allergy status to other drugs, medicaments and biological substances status: Secondary | ICD-10-CM | POA: Diagnosis not present

## 2021-05-20 DIAGNOSIS — F0390 Unspecified dementia without behavioral disturbance: Secondary | ICD-10-CM | POA: Diagnosis present

## 2021-05-20 DIAGNOSIS — I110 Hypertensive heart disease with heart failure: Secondary | ICD-10-CM | POA: Diagnosis present

## 2021-05-20 DIAGNOSIS — Z79899 Other long term (current) drug therapy: Secondary | ICD-10-CM | POA: Diagnosis not present

## 2021-05-20 DIAGNOSIS — E860 Dehydration: Secondary | ICD-10-CM | POA: Diagnosis present

## 2021-05-20 DIAGNOSIS — R791 Abnormal coagulation profile: Secondary | ICD-10-CM | POA: Diagnosis present

## 2021-05-20 DIAGNOSIS — E119 Type 2 diabetes mellitus without complications: Secondary | ICD-10-CM

## 2021-05-20 DIAGNOSIS — I480 Paroxysmal atrial fibrillation: Secondary | ICD-10-CM | POA: Diagnosis present

## 2021-05-20 DIAGNOSIS — Z8052 Family history of malignant neoplasm of bladder: Secondary | ICD-10-CM | POA: Diagnosis not present

## 2021-05-20 DIAGNOSIS — J189 Pneumonia, unspecified organism: Secondary | ICD-10-CM

## 2021-05-20 DIAGNOSIS — D500 Iron deficiency anemia secondary to blood loss (chronic): Secondary | ICD-10-CM

## 2021-05-20 DIAGNOSIS — E1165 Type 2 diabetes mellitus with hyperglycemia: Secondary | ICD-10-CM | POA: Diagnosis present

## 2021-05-20 DIAGNOSIS — Z950 Presence of cardiac pacemaker: Secondary | ICD-10-CM

## 2021-05-20 DIAGNOSIS — Z20822 Contact with and (suspected) exposure to covid-19: Secondary | ICD-10-CM | POA: Diagnosis present

## 2021-05-20 DIAGNOSIS — I248 Other forms of acute ischemic heart disease: Secondary | ICD-10-CM | POA: Diagnosis present

## 2021-05-20 DIAGNOSIS — Z79890 Hormone replacement therapy (postmenopausal): Secondary | ICD-10-CM | POA: Diagnosis not present

## 2021-05-20 DIAGNOSIS — Z87891 Personal history of nicotine dependence: Secondary | ICD-10-CM | POA: Diagnosis not present

## 2021-05-20 DIAGNOSIS — R6520 Severe sepsis without septic shock: Secondary | ICD-10-CM | POA: Diagnosis present

## 2021-05-20 DIAGNOSIS — E785 Hyperlipidemia, unspecified: Secondary | ICD-10-CM

## 2021-05-20 DIAGNOSIS — Z823 Family history of stroke: Secondary | ICD-10-CM | POA: Diagnosis not present

## 2021-05-20 LAB — LACTIC ACID, PLASMA: Lactic Acid, Venous: 1.5 mmol/L (ref 0.5–1.9)

## 2021-05-20 LAB — URINALYSIS, COMPLETE (UACMP) WITH MICROSCOPIC
Bilirubin Urine: NEGATIVE
Glucose, UA: NEGATIVE mg/dL
Hgb urine dipstick: NEGATIVE
Ketones, ur: NEGATIVE mg/dL
Nitrite: NEGATIVE
Protein, ur: NEGATIVE mg/dL
Specific Gravity, Urine: 1.011 (ref 1.005–1.030)
pH: 5 (ref 5.0–8.0)

## 2021-05-20 LAB — RETICULOCYTES
Immature Retic Fract: 18 % — ABNORMAL HIGH (ref 2.3–15.9)
RBC.: 2.84 MIL/uL — ABNORMAL LOW (ref 4.22–5.81)
Retic Count, Absolute: 40.3 10*3/uL (ref 19.0–186.0)
Retic Ct Pct: 1.4 % (ref 0.4–3.1)

## 2021-05-20 LAB — FOLATE: Folate: 15 ng/mL (ref >5.9)

## 2021-05-20 LAB — GLUCOSE, CAPILLARY
Glucose-Capillary: 153 mg/dL — ABNORMAL HIGH (ref 70–99)
Glucose-Capillary: 182 mg/dL — ABNORMAL HIGH (ref 70–99)

## 2021-05-20 LAB — CBG MONITORING, ED
Glucose-Capillary: 108 mg/dL — ABNORMAL HIGH (ref 70–99)
Glucose-Capillary: 148 mg/dL — ABNORMAL HIGH (ref 70–99)

## 2021-05-20 LAB — TROPONIN I (HIGH SENSITIVITY): Troponin I (High Sensitivity): 190 ng/L (ref <18)

## 2021-05-20 LAB — VITAMIN B12: Vitamin B-12: 872 pg/mL (ref 180–914)

## 2021-05-20 LAB — HEMOGLOBIN A1C
Hgb A1c MFr Bld: 8 % — ABNORMAL HIGH (ref 4.8–5.6)
Mean Plasma Glucose: 182.9 mg/dL

## 2021-05-20 LAB — TYPE AND SCREEN
ABO/RH(D): A POS
Antibody Screen: NEGATIVE

## 2021-05-20 LAB — IRON AND TIBC
Iron: 16 ug/dL — ABNORMAL LOW (ref 45–182)
Saturation Ratios: 8 % — ABNORMAL LOW (ref 17.9–39.5)
TIBC: 206 ug/dL — ABNORMAL LOW (ref 250–450)
UIBC: 190 ug/dL

## 2021-05-20 LAB — FERRITIN: Ferritin: 88 ng/mL (ref 24–336)

## 2021-05-20 MED ORDER — INSULIN ASPART 100 UNIT/ML IJ SOLN
0.0000 [IU] | Freq: Three times a day (TID) | INTRAMUSCULAR | Status: DC
  Administered 2021-05-20: 3 [IU] via SUBCUTANEOUS
  Administered 2021-05-20: 2 [IU] via SUBCUTANEOUS
  Administered 2021-05-21: 3 [IU] via SUBCUTANEOUS
  Filled 2021-05-20 (×3): qty 1

## 2021-05-20 MED ORDER — PANTOPRAZOLE SODIUM 40 MG IV SOLR
40.0000 mg | INTRAVENOUS | Status: DC
  Administered 2021-05-20 – 2021-05-21 (×2): 40 mg via INTRAVENOUS
  Filled 2021-05-20 (×2): qty 10

## 2021-05-20 MED ORDER — AMLODIPINE BESYLATE 5 MG PO TABS
2.5000 mg | ORAL_TABLET | Freq: Every day | ORAL | Status: DC
Start: 2021-05-20 — End: 2021-05-21
  Administered 2021-05-20 – 2021-05-21 (×2): 2.5 mg via ORAL
  Filled 2021-05-20 (×2): qty 1

## 2021-05-20 MED ORDER — WARFARIN - PHARMACIST DOSING INPATIENT
Freq: Every day | Status: DC
  Filled 2021-05-20: qty 1

## 2021-05-20 MED ORDER — SIMVASTATIN 20 MG PO TABS
20.0000 mg | ORAL_TABLET | Freq: Every day | ORAL | Status: DC
  Administered 2021-05-20: 20 mg via ORAL
  Filled 2021-05-20: qty 1

## 2021-05-20 NOTE — Assessment & Plan Note (Addendum)
Initially dehydrated and dry.  Received IV fluids.  Euvolemic on discharge. ?

## 2021-05-20 NOTE — TOC Initial Note (Signed)
Transition of Care (TOC) - Initial/Assessment Note  ? ? ?Patient Details  ?Name: Andrew [name] ?MRN: [id number] ?Date of Birth: Feb 19, 1935 ? ?Transition of Care (TOC) CM/SW Contact:    ?Andrew Hutching, RN ?Phone Number: ?05/20/2021, 12:29 PM ? ?Clinical Narrative:                 ?Patient placed under observation for shortness of breath, community acquired pneumonia on acute O2 at 2L.  RNCM met with patient at the beside.  He is from home with his wife, he reports walking at home with a cane and if he is feeling really weak with a walker.  History of diastolic CHF.   ?RNCM was able to speak with wife, Andrew [name] via phone.  Andrew [name] provides all transportation patient does not drive.  She would be interested in having PT come out and patient also agreed to that.  She is not sure if he would agree to rehab if recommended.   ? ?TOC will follow, PT and OT consults pending. ? ?Expected Discharge Plan: Andrew [name] ?Barriers to Discharge: Continued Medical Work up ? ? ?Patient Goals and CMS Choice ?  ?  ?  ? ?Expected Discharge Plan and Services ?Expected Discharge Plan: [place] ?  ?Discharge Planning Services: CM Consult ?  ?Living arrangements for the past 2 months: [place] ?                ?  ?  ?  ?  ?  ?  ?  ?  ?  ?  ? ?Prior Living Arrangements/Services ?Living arrangements for the past 2 months: [place] ?Lives with:: Spouse ?Patient language and need for interpreter reviewed:: Yes ?Do you feel safe going [name] to the place where you live?: Yes      ?Need for Family Participation in Patient Care: Yes (Comment) ?Care giver support system in place?: Yes (comment) ?Current home services: DME (cane and walker) ?Criminal Activity/Legal Involvement Pertinent to Current Situation/Hospitalization: No - Comment as needed ? ?Activities of Daily Living ?  ?  ? ?Permission Sought/Granted ?Permission sought to share information with : Case Manager, Family Supports ?Permission granted to  share information with : Yes, Verbal Permission Granted ? Share Information with NAME: Andrew [name] ?   ? Permission granted to share info w Relationship: spouse ? Permission granted to share info w Contact Information: [phone] ? ?Emotional Assessment ?Appearance:: Appears stated age ?Attitude/Demeanor/Rapport: Engaged ?Affect (typically observed): Accepting ?Orientation: : Oriented to Self, Oriented to Place ?Alcohol / Substance Use: Not Applicable ?Psych Involvement: No (comment) ? ?Admission diagnosis:  SOB (shortness of breath) [R06.02] ?Patient Active Problem List  ? Diagnosis Date Noted  ? PNA (pneumonia) 05/20/2021  ? AKI (acute kidney injury) ([place]) 05/20/2021  ? Chronic anticoagulation 05/20/2021  ? Anemia 05/20/2021  ? Acute respiratory failure with hypoxia ([place]) 05/20/2021  ? SOB (shortness of breath) 05/19/2021  ? Sepsis ([place]) 10/06/2018  ? Chronic diastolic (congestive) heart failure ([place]) 04/01/2018  ? HTN (hypertension) 04/01/2018  ? DM (diabetes mellitus) ([place]) 04/01/2018  ? Paroxysmal atrial fibrillation ([place]) 04/01/2018  ? Symptomatic bradycardia 03/25/2018  ? Acute CHF (congestive heart failure) ([place]) 03/23/2018  ? ?PCP:  Andrew Hire, MD ?Pharmacy:   ?[facility] [address] ?[place] [address] ?Phone: [phone] Fax: [phone] ? ? ? ? ?Social Determinants of Health (SDOH) Interventions ?  ? ?Readmission Risk Interventions ? ?  10/08/2018  ?  11:12 AM  ?Readmission Risk Prevention Plan  ?Transportation Screening Complete  ?PCP or Specialist Appt within 5-7 Days Complete  ?Home Care Screening Complete  ?Medication Review (RN CM) Complete  ? ? ? ?

## 2021-05-20 NOTE — ED Notes (Signed)
Assisted pt to the toilet to have a BM. ?

## 2021-05-20 NOTE — ED Notes (Signed)
Assisted pt with using the urinal. Observed that his brief is wet. Will place a clean brief on patient. ?

## 2021-05-20 NOTE — Assessment & Plan Note (Addendum)
Patient met criteria for severe sepsis on admission given leukocytosis, tachypnea, pneumonia source with lactic acid level of 3.8.  Treated aggressively with IV fluids and antibiotics.  By 4/18, lactic acidosis had improved.  By 4/19, patient no longer hypoxic. ?

## 2021-05-20 NOTE — Assessment & Plan Note (Addendum)
Rate controlled.  Initially supratherapeutic.  INR trended downward and by day of discharge, INR down to 3.6.  He will resume his Coumadin on 4/20. ?

## 2021-05-20 NOTE — Consult Note (Signed)
ANTICOAGULATION CONSULT NOTE - Initial Consult ? ?Pharmacy Consult for warfarin ?Indication: atrial fibrillation ? ?Allergies  ?Allergen Reactions  ? Methyldopa Nausea Only and Palpitations  ?  Other reaction(s): Unknown ?Other reaction(s): NAUSEA ?'palpitation" ?Other reaction(s): NAUSEA ?Other reaction(s): NAUSEA ?  ? ? ?Patient Measurements: ?Height: 5\' 8"  (172.7 cm) ?Weight: 77.1 kg (170 lb) ?IBW/kg (Calculated) : 68.4 ?Heparin Dosing Weight: 77.1 kg ? ?Vital Signs: ?Temp: 99.4 ?F (37.4 ?C) (04/17 2111) ?BP: 127/64 (04/18 0630) ?Pulse Rate: 61 (04/18 0630) ? ?Labs: ?Recent Labs  ?  05/19/21 ?2123 05/19/21 ?2218 05/19/21 ?2350  ?HGB 10.6*  --   --   ?HCT 33.5*  --   --   ?PLT 171  --   --   ?LABPROT  --  53.5*  --   ?INR  --  6.1*  --   ?CREATININE 1.26*  --   --   ?TROPONINIHS 186*  --  190*  ? ? ?Estimated Creatinine Clearance: 42.2 mL/min (A) (by C-G formula based on SCr of 1.26 mg/dL (H)). ? ? ?Medical History: ?Past Medical History:  ?Diagnosis Date  ? CHF (congestive heart failure) ([place])   ? Diabetes mellitus without complication ([place])   ? Essential hypertension   ? Mixed hyperlipidemia   ? Permanent atrial fibrillation ([place])   ? a. s/p catheter ablation ~ 12 yrs ago @ [facility] per family->unsuccessful; b. CHA2DS2VASc = 5-->chronic coumadin.  ? ? ?Medications:  ?Warfarin 1.25mg  MWF, 2.5mg  TuThSaSu ? ?Assessment: ?Pharmacy has been consulted to monitor and dose Warfarin in 86yo patient with history of atrial fibrillation admitted with acute hypoxic respiratory failure. Initial troponin level of 190. Patient's home dose is stated above, with last dose taken being 2.5mg  on 05/19/21.  ? ?Baseline labs: INR 6.1, Hgb 10.6, Plts 171 ? ?Date INR Dose ?4/17 6.1 held ? ?DDI: Azithromycin ? ?Goal of Therapy:  ?INR 2-3 ?Monitor platelets by anticoagulation protocol: Yes ?  ?Plan:  ?- INR 6.1 on 4/17@2218 . Will defer repeat level currently. No signs of bleeding noted per attending, therefore deferring Vitamin K  administration. ?- Holding dose for today ?- repeat INR with AM labs tomorrow ? ?Andrew [name] ?05/20/2021,7:36 AM ? ? ?

## 2021-05-20 NOTE — ED Notes (Signed)
Pink top for type and screen recollected, labeled, checked by another RN, and sent to the lab ?

## 2021-05-20 NOTE — Assessment & Plan Note (Addendum)
Secondary to pneumonia.  Improved with antibiotics and nebulizers.  Able to ambulate on room air by day of discharge without dropping his oxygen saturation ?

## 2021-05-20 NOTE — Progress Notes (Addendum)
PROGRESS NOTE  CEDAR NARDIELLO UJW:[id number] DOB: 1935/07/26 DOA: 05/19/2021 PCP: Gracelyn Nurse, MD   LOS: 0 days   Brief Narrative / Interim history: 86 year old male with history of diastolic CHF, DM2, HTN, A-fib status post ablation about 12 years ago currently on Coumadin, pacemaker, dementia, comes to the hospital with complaints of shortness of breath.  Wife is at bedside and reports that he started having shortness of breath, cough, congestion and felt warm.  His symptoms progressed and about couple hours later she realized that he has a fever, was breathing fast and was brought to the emergency room.  He was found to have a left lower lobe infiltrate on chest x-ray, elevated WBC, he was hypoxic on room air requiring supplemental oxygen, he was placed on antibiotics for community-acquired pneumonia and admitted to the hospital.   Subjective / 24h Interval events: He is feeling well this morning.  Underlying dementia and does not remember much.  States that he has a cough and feels somewhat congested this morning.  Denies any shortness of breath, denies any chest pain.  Assesement and Plan: Principal Problem:   SOB (shortness of breath) Active Problems:   Acute respiratory failure with hypoxia (HCC)   PNA (pneumonia)   Sepsis (HCC)   AKI (acute kidney injury) (HCC)   Paroxysmal atrial fibrillation (HCC)   Chronic anticoagulation   Anemia   Chronic diastolic (congestive) heart failure (HCC)   HTN (hypertension)   DM (diabetes mellitus) (HCC)   Assessment and Plan: Principal problem Acute hypoxic respiratory failure due to lobar pneumonia, left lower lobe-patient started on ceftriaxone and azithromycin, continue.  Remains hypoxic this morning.  Continue to monitor cultures.  Try to wean off oxygen, PT to see  Active problems Sepsis due to left lower lobe pneumonia-see discussion above, continue antibiotics.  Met sepsis criteria with leukocytosis, tachypnea, source.  His  procalcitonin was also slightly elevated.  Paroxysmal A-fib-he does not seem to be on nodal agents, he is on Coumadin.  Supratherapeutic INR-INR 6 on admission, no bleeding, continue to closely monitor.  Essential hypertension-continue low-dose amlodipine  DM2-continue sliding scale.  A1c 8.0 which is acceptable in his age group  Hyperlipidemia-continue statin  Dementia-resume home medications this morning  Mild creatinine elevation-baseline creatinine 0.8-1.0, currently 1.2.  Monitor, hold home ACE inhibitor.  Receiving fluids  Troponin elevation-flat, no chest pain, not in a pattern consistent with ACS, likely demand ischemia  Chronic diastolic CHF-most recent 2D echo in 2020 with EF 50-55%.  Has a degree of chronic lower extremity swelling which seems to be at baseline  Pacemaker in place-monitor on telemetry  Normocytic anemia-anemia panel shows slightly low iron levels.  Will need iron on discharge.  Scheduled Meds:  amLODipine  2.5 mg Oral Daily   aspirin  324 mg Oral Once   docusate sodium  100 mg Oral BID   insulin aspart  0-15 Units Subcutaneous TID WC   pantoprazole (PROTONIX) IV  40 mg Intravenous Q24H   simvastatin  20 mg Oral QHS   Warfarin - Pharmacist Dosing Inpatient   Does not apply q1600   Continuous Infusions:  sodium chloride 50 mL/hr at 05/20/21 0452   azithromycin     cefTRIAXone (ROCEPHIN)  IV     PRN Meds:.acetaminophen **OR** acetaminophen, albuterol, bisacodyl, guaiFENesin, hydrALAZINE, HYDROcodone-acetaminophen, morphine injection, polyethylene glycol  Diet Orders (From admission, onward)     Start     Ordered   05/20/21 0056  Diet Carb Modified Fluid consistency: Thin; Room  service appropriate? Yes  Diet effective now       Question Answer Comment  Diet-HS Snack? Nothing   Calorie Level Medium 1600-2000   Fluid consistency: Thin   Room service appropriate? Yes      05/20/21 0055            DVT prophylaxis: Place TED hose Start:  05/20/21 0705   Lab Results  Component Value Date   PLT 171 05/19/2021      Code Status: Full Code  Family Communication: Wife present at bedside  Status is: Observation  The patient will require care spanning > 2 midnights and should be moved to inpatient because: Persistent hypoxia, IV antibiotics   Level of care: Telemetry Cardiac  Consultants:  None  Procedures:  none  Microbiology  Blood cultures - NGTD  Antimicrobials: Ceftriaxone 4/17 >> Azithromycin 4/17 >>    Objective: Vitals:   05/20/21 0430 05/20/21 0530 05/20/21 0600 05/20/21 0630  BP: (!) 116/57 126/62 126/70 127/64  Pulse: 62 61 61 61  Resp: 16 (!) 23 (!) 23 (!) 24  Temp:      SpO2: 92% 99% 98% 100%  Weight:      Height:        Intake/Output Summary (Last 24 hours) at 05/20/2021 5638 Last data filed at 05/20/2021 0153 Gross per 24 hour  Intake 2350 ml  Output --  Net 2350 ml   Wt Readings from Last 3 Encounters:  05/19/21 77.1 kg  10/08/18 83.6 kg  08/22/18 80.1 kg    Examination:  Constitutional: NAD Eyes: no scleral icterus ENMT: Mucous membranes are moist.  Neck: normal, supple Respiratory: Diminished at the bases, faint rhonchi bilateral lower fields, no wheezing Cardiovascular: Regular rate and rhythm, no murmurs / rubs / gallops.  1+ pitting edema.  Chronic venous stasis changes bilateral lower extremities Abdomen: non distended, no tenderness. Bowel sounds positive.  Musculoskeletal: no clubbing / cyanosis.  Skin: no rashes Neurologic: non focal    Data Reviewed: I have independently reviewed following labs and imaging studies   CBC Recent Labs  Lab 05/19/21 2123  WBC 12.1*  HGB 10.6*  HCT 33.5*  PLT 171  MCV 96.8  MCH 30.6  MCHC 31.6  RDW 13.3    Recent Labs  Lab 05/19/21 2123 05/19/21 2218 05/20/21 0154  NA 136  --   --   K 4.0  --   --   CL 98  --   --   CO2 26  --   --   GLUCOSE 354*  --   --   BUN 48*  --   --   CREATININE 1.26*  --   --    CALCIUM 8.8*  --   --   AST  --  25  --   ALT  --  13  --   ALKPHOS  --  63  --   BILITOT  --  0.6  --   ALBUMIN  --  2.8*  --   PROCALCITON  --  0.42  --   LATICACIDVEN  --  3.8* 1.5  INR  --  6.1*  --   HGBA1C 8.0*  --   --     ------------------------------------------------------------------------------------------------------------------ No results for input(s): CHOL, HDL, LDLCALC, TRIG, CHOLHDL, LDLDIRECT in the last 72 hours.  Lab Results  Component Value Date   HGBA1C 8.0 (H) 05/19/2021   ------------------------------------------------------------------------------------------------------------------ No results for input(s): TSH, T4TOTAL, T3FREE, THYROIDAB in the last 72 hours.  Invalid input(s):  FREET3  Cardiac Enzymes No results for input(s): CKMB, TROPONINI, MYOGLOBIN in the last 168 hours.  Invalid input(s): CK ------------------------------------------------------------------------------------------------------------------    Component Value Date/Time   BNP 281.0 (H) 10/07/2018 1227    CBG: No results for input(s): GLUCAP in the last 168 hours.  Recent Results (from the past 240 hour(s))  Resp Panel by RT-PCR (Flu A&B, Covid) Nasopharyngeal Swab     Status: None   Collection Time: 05/19/21  9:22 PM   Specimen: Nasopharyngeal Swab; Nasopharyngeal(NP) swabs in vial transport medium  Result Value Ref Range Status   SARS Coronavirus 2 by RT PCR NEGATIVE NEGATIVE Final    Comment: (NOTE) SARS-CoV-2 target nucleic acids are NOT DETECTED.  The SARS-CoV-2 RNA is generally detectable in upper respiratory specimens during the acute phase of infection. The lowest concentration of SARS-CoV-2 viral copies this assay can detect is 138 copies/mL. A negative result does not preclude SARS-Cov-2 infection and should not be used as the sole basis for treatment or other patient management decisions. A negative result may occur with  improper specimen  collection/handling, submission of specimen other than nasopharyngeal swab, presence of viral mutation(s) within the areas targeted by this assay, and inadequate number of viral copies(<138 copies/mL). A negative result must be combined with clinical observations, patient history, and epidemiological information. The expected result is Negative.  Fact Sheet for Patients:  BloggerCourse.com  Fact Sheet for Healthcare Providers:  SeriousBroker.it  This test is no t yet approved or cleared by the [place] FDA and  has been authorized for detection and/or diagnosis of SARS-CoV-2 by FDA under an Emergency Use Authorization (EUA). This EUA will remain  in effect (meaning this test can be used) for the duration of the COVID-19 declaration under Section 564(b)(1) of the Act, 21 U.S.C.section 360bbb-3(b)(1), unless the authorization is terminated  or revoked sooner.       Influenza A by PCR NEGATIVE NEGATIVE Final   Influenza B by PCR NEGATIVE NEGATIVE Final    Comment: (NOTE) The Xpert Xpress SARS-CoV-2/FLU/RSV plus assay is intended as an aid in the diagnosis of influenza from Nasopharyngeal swab specimens and should not be used as a sole basis for treatment. Nasal washings and aspirates are unacceptable for Xpert Xpress SARS-CoV-2/FLU/RSV testing.  Fact Sheet for Patients: BloggerCourse.com  Fact Sheet for Healthcare Providers: SeriousBroker.it  This test is not yet approved or cleared by the [place] FDA and has been authorized for detection and/or diagnosis of SARS-CoV-2 by FDA under an Emergency Use Authorization (EUA). This EUA will remain in effect (meaning this test can be used) for the duration of the COVID-19 declaration under Section 564(b)(1) of the Act, 21 U.S.C. section 360bbb-3(b)(1), unless the authorization is terminated or revoked.  Performed at [facility], [address] [place], [place] [address]   Blood culture (routine x 2)     Status: None (Preliminary result)   Collection Time: 05/19/21 10:19 PM   Specimen: BLOOD  Result Value Ref Range Status   Specimen Description BLOOD RIGHT ASSIST CONTROL  Final   Special Requests   Final    BOTTLES DRAWN AEROBIC AND ANAEROBIC Blood Culture adequate volume   Culture   Final    NO GROWTH < 12 HOURS Performed at [facility], [address], [place], [place] [address]    Report Status PENDING  Incomplete  Blood culture (routine x 2)     Status: None (Preliminary result)   Collection Time: 05/19/21 10:19 PM   Specimen:  BLOOD  Result Value Ref Range Status   Specimen Description BLOOD RIGHT HAND  Final   Special Requests   Final    BOTTLES DRAWN AEROBIC AND ANAEROBIC Blood Culture adequate volume   Culture   Final    NO GROWTH < 12 HOURS Performed at [facility], [address] [place], [place] [address]    Report Status PENDING  Incomplete     Radiology Studies: DG Chest 2 View  Result Date: 05/19/2021 CLINICAL DATA:  Shortness of breath. EXAM: CHEST - 2 VIEW COMPARISON:  Chest radiograph dated 10/07/2018. FINDINGS: Left lung base/retrocardiac opacity most consistent with developing infiltrate. Clinical correlation and follow-up to resolution recommended. Minimal right lung base atelectasis or infiltrate. No pleural effusion or pneumothorax. Mild cardiomegaly. Atherosclerotic calcification of the aorta. Left pectoral pacemaker device. No acute osseous pathology. IMPRESSION: 1. Developing left lower lobe infiltrate. 2. Minimal right lung base atelectasis or infiltrate. Electronically Signed   By: Elgie Collard M.D.   On: 05/19/2021 22:12     Pamella Pert, MD, PhD [facility]  Between 7 am - 7 pm I am available, please contact me via Amion (for emergencies) or Securechat (non urgent messages)  Between 7 pm - 7 am I am not available, please  contact night coverage MD/APP via Amion

## 2021-05-20 NOTE — Evaluation (Signed)
Occupational Therapy Evaluation ?Patient Details ?Name: [name] ?MRN: [id number] ?DOB: 1935-10-08 ?Today's Date: 05/20/2021 ? ? ?History of Present Illness presented to ER secondary to fever, cough, SOB; admitted for management of sepsis, acute hypoxic respiratory failure related to CAP  ? ?Clinical Impression ?  ?Mr. Schlueter was seen for OT evaluation this date. Prior to hospital admission, pt was modified independent with ADL management. Pt lives with his spouse in a 1 level home with a level entry. He generally uses a SPC for functional mobility but has a RW that he uses for longer community distances. Pt denies falls history in the past 6 months. Currently pt demonstrates impairments as described below (See OT problem list) which functionally limit his ability to perform ADL/self-care tasks. Pt currently requires MIN A for bed/functional mobility, MOD A for LB ADL management, and SET UP assist for seated UB ADL tasks. Pt would benefit from skilled OT services to address noted impairments and functional limitations (see below for any additional details) in order to maximize safety and independence while minimizing falls risk and caregiver burden. Upon hospital discharge, recommend HHOT to maximize pt safety and return to functional independence during meaningful occupations of daily life.   ? ?Recommendations for follow up therapy are one component of a multi-disciplinary discharge planning process, led by the attending physician.  Recommendations may be updated based on patient status, additional functional criteria and insurance authorization.  ? ?Follow Up Recommendations ? Home health OT  ?  ?Assistance Recommended at Discharge PRN  ?Patient can return home with the following   ? ?  ?Functional Status Assessment ? Patient has had a recent decline in their functional status and demonstrates the ability to make significant improvements in function in a reasonable and predictable amount of time.  ?Equipment  Recommendations ? BSC/3in1  ?  ?Recommendations for Other Services   ? ? ?  ?Precautions / Restrictions Precautions ?Precautions: Fall;ICD/Pacemaker ?Restrictions ?Weight Bearing Restrictions: No  ? ?  ? ?Mobility Bed Mobility ?Overal bed mobility: Needs Assistance ?Bed Mobility: Supine to Sit ?  ?  ?Supine to sit: HOB elevated, Min assist ?  ?  ?General bed mobility comments: Min A for trunk elevation. ?  ? ?Transfers ?Overall transfer level: Needs assistance ?Equipment used: 1 person hand held assist ?Transfers: Sit to/from Stand ?Sit to Stand: Min guard, Min assist ?  ?  ?  ?  ?  ?  ?  ? ?  ?Balance Overall balance assessment: Needs assistance ?Sitting-balance support: No upper extremity supported, Feet supported ?Sitting balance-Leahy Scale: Good ?Sitting balance - Comments: Steady static sitting, reaching inside BOS. ?  ?Standing balance support: Single extremity supported, Bilateral upper extremity supported, During functional activity, Reliant on assistive device for balance ?Standing balance-Leahy Scale: Fair ?  ?  ?  ?  ?  ?  ?  ?  ?  ?  ?  ?  ?   ? ?ADL either performed or assessed with clinical judgement  ? ?ADL Overall ADL's : Needs assistance/impaired ?  ?  ?  ?  ?  ?  ?  ?  ?  ?  ?  ?  ?  ?  ?  ?  ?  ?  ?  ?General ADL Comments: functionally limited by generalized weakness, decreased activity tolerance, and cardiopulmonary status. Requires MIN A for bed mobility, MIN A for STS and taking steps at EOB. Anticipate MIN-MOD A For more exertional ADL management including LB bathing/dressing.  ? ? ? ?  Vision Patient Visual Report: No change from baseline ?   ?   ?Perception   ?  ?Praxis   ?  ? ?Pertinent Vitals/Pain Pain Assessment ?Pain Assessment: No/denies pain  ? ? ? ?Hand Dominance Right ?  ?Extremity/Trunk Assessment Upper Extremity Assessment ?Upper Extremity Assessment: Generalized weakness ?  ?Lower Extremity Assessment ?Lower Extremity Assessment: Overall WFL for tasks assessed ?  ?  ?   ?Communication Communication ?Communication: No difficulties ?  ?Cognition Arousal/Alertness: Awake/alert ?Behavior During Therapy: [facility] for tasks assessed/performed ?Overall Cognitive Status: Within Functional Limits for tasks assessed ?  ?  ?  ?  ?  ?  ?  ?  ?  ?  ?  ?  ?  ?  ?  ?  ?General Comments: Pleasant, conversational, eager to participate in therapy. HOH. ?  ?  ?General Comments  VSS t/o session. pt on 2L [place] and O2 sats remain in upper 90's. ? ?  ?Exercises Other Exercises ?Other Exercises: Pt educated on role of OT in acute setting, safe use of AE/DME for ADL management, routines modifications to support safety and functional independence during ADL management, and falls prevention strategies. ?  ?Shoulder Instructions    ? ? ?Home Living Family/patient expects to be discharged to:: Private residence ?Living Arrangements: Spouse/significant other ?Available Help at Discharge: Family;Available 24 hours/day ?Type of Home: House ?Home Access: Level entry ?  ?  ?Home Layout: One level ?  ?  ?Bathroom Shower/Tub: Walk-in shower;Curtain ?  ?Bathroom Toilet: Standard ?Bathroom Accessibility: No ?  ?Home Equipment: Shower seat;Grab bars - tub/shower;Cane - single point;Rollator (4 wheels) ?  ?  ?  ? ?  ?Prior Functioning/Environment Prior Level of Function : Independent/Modified Independent ?  ?  ?  ?  ?  ?  ?Mobility Comments: Uses a SPC for household mobility. Will use walker for limited community distances.  No home O2. ?ADLs Comments: Tries to stay as independent as possible. Occasionally requires assistance with LB dressing and foot care. Sees podiatrist for regular foot care. Denies falls. ?  ? ?  ?  ?OT Problem List: Decreased strength;Decreased coordination;Cardiopulmonary status limiting activity;Decreased range of motion;Decreased activity tolerance;Decreased safety awareness;Impaired balance (sitting and/or standing);Decreased knowledge of use of DME or AE ?  ?   ?OT Treatment/Interventions:  Self-care/ADL training;Therapeutic activities;Therapeutic exercise;DME and/or AE instruction;Patient/family education;Balance training;Energy conservation  ?  ?OT Goals(Current goals can be found in the care plan section) Acute Rehab OT Goals ?Patient Stated Goal: To go home ?OT Goal Formulation: With patient ?Time For Goal Achievement: 06/03/21 ?Potential to Achieve Goals: Good ?ADL Goals ?Pt Will Perform Grooming: sitting;with modified independence ?Pt Will Perform Lower Body Dressing: sit to/from stand;with set-up;with supervision;with caregiver independent in assisting ?Pt Will Transfer to Toilet: bedside commode;with set-up;with supervision ?Pt Will Perform Toileting - Clothing Manipulation and hygiene: with adaptive equipment;sit to/from stand;with set-up;with supervision;with caregiver independent in assisting  ?OT Frequency: Min 2X/week ?  ? ?Co-evaluation   ?  ?  ?  ?  ? ?  ?AM-PAC OT "6 Clicks" Daily Activity     ?Outcome Measure Help from another person eating meals?: None ?Help from another person taking care of personal grooming?: A Little ?Help from another person toileting, which includes using toliet, bedpan, or urinal?: A Little ?Help from another person bathing (including washing, rinsing, drying)?: A Little ?Help from another person to put on and taking off regular upper body clothing?: A Little ?Help from another person to put on and taking off regular lower  body clothing?: A Little ?6 Click Score: 19 ?  ?End of Session Equipment Utilized During Treatment: Gait belt;Rolling walker (2 wheels) ? ?Activity Tolerance: Patient tolerated treatment well ?Patient left: in bed;with call bell/phone within reach;with bed alarm set ? ?OT Visit Diagnosis: Other abnormalities of gait and mobility (R26.89);Muscle weakness (generalized) (M62.81)  ?              ?Time: 5621-3086 ?OT Time Calculation (min): 22 min ?Charges:  OT General Charges ?$OT Visit: 1 Visit ?OT Evaluation ?$OT Eval Moderate Complexity: 1  Mod ?OT Treatments ?$Self Care/Home Management : 8-22 mins ? ?Rockney Ghee, M.S., OTR/L ?Feeding Team - [facility] ?Ascom: [phone] ?05/20/21, 3:59 PM ? ?

## 2021-05-20 NOTE — Evaluation (Signed)
Physical Therapy Evaluation ?Patient Details ?Name: Andrew [name] ?MRN: [id number] ?DOB: 1935/03/20 ?Today's Date: 05/20/2021 ? ?History of Present Illness ? presented to ER secondary to fever, cough, SOB; admitted for management of sepsis, acute hypoxic respiratory failure related to CAP  ?Clinical Impression ? Patient resting in bed upon arrival to room; easily awakens to voice.  Wife at bedside, supportive and encouraging to patient.  Denies pain; does endorse mild weakness throughout. ?Bilat UE/LE strength and ROM grossly symmetrical and WFL for basic transfers and gait; no focal weakness appreciated.  Able to complete bed mobility with mod indep; sit/stand, basic transfers and gait (60') with RW, cga/min assist.  Demonstrates forward flexed posture with RW arms length anterior to patient (min cuing for position and management); reciprocal stepping, often stepping outside BOS of RW.  May consider trial of [facility] next session.  Mod SOB with exertion; sats maintained >91% on RA at rest and with gait. ?Left on RA end of session; RN informed/aware.  Patient leaving unit for transfer to med-surg floor. ?Would benefit from skilled PT to address above deficits and promote optimal return to PLOF.; Recommend transition to HHPT upon discharge from acute hospitalization. ?   ?   ? ?Recommendations for follow up therapy are one component of a multi-disciplinary discharge planning process, led by the attending physician.  Recommendations may be updated based on patient status, additional functional criteria and insurance authorization. ? ?Follow Up Recommendations Home health PT ? ?  ?Assistance Recommended at Discharge    ?Patient can return home with the following ? A little help with walking and/or transfers;A little help with bathing/dressing/bathroom ? ?  ?Equipment Recommendations  (has RW)  ?Recommendations for Other Services ?    ?  ?Functional Status Assessment Patient has had a recent decline in their functional status  and demonstrates the ability to make significant improvements in function in a reasonable and predictable amount of time.  ? ?  ?Precautions / Restrictions Precautions ?Precautions: Fall;ICD/Pacemaker ?Restrictions ?Weight Bearing Restrictions: No  ? ?  ? ?Mobility ? Bed Mobility ?Overal bed mobility: Modified Independent ?  ?  ?  ?  ?  ?  ?  ?  ? ?Transfers ?Overall transfer level: Needs assistance ?Equipment used: Rolling walker (2 wheels) ?Transfers: Sit to/from Stand ?Sit to Stand: Min guard, Min assist ?  ?  ?  ?  ?  ?General transfer comment: cuing for hand placement to prevent pulling on RW ?  ? ?Ambulation/Gait ?Ambulation/Gait assistance: Min guard, Min assist ?Gait Distance (Feet): 60 Feet ?Assistive device: Rolling walker (2 wheels) ?  ?  ?  ?  ?General Gait Details: forward flexed posture with RW arms length anterior to patient (min cuing for position and management); reciprocal stepping, often stepping outside BOS of RW.  May consider trial of [facility] next session.  Mod SOB with exertion; sats maintained >91% on RA at rest and with gait ? ?Stairs ?  ?  ?  ?  ?  ? ?Wheelchair Mobility ?  ? ?Modified Rankin (Stroke Patients Only) ?  ? ?  ? ?Balance Overall balance assessment: Needs assistance ?Sitting-balance support: No upper extremity supported, Feet supported ?Sitting balance-Leahy Scale: Good ?  ?  ?Standing balance support: Bilateral upper extremity supported ?Standing balance-Leahy Scale: Fair ?  ?  ?  ?  ?  ?  ?  ?  ?  ?  ?  ?  ?   ? ? ? ?Pertinent Vitals/Pain Pain Assessment ?Pain Assessment: No/denies pain  ? ? ?  Home Living Family/patient expects to be discharged to:: Private residence ?Living Arrangements: Spouse/significant other ?Available Help at Discharge: Family;Available 24 hours/day ?Type of Home: House ?Home Access: Level entry ?  ?  ?  ?Home Layout: One level ?Home Equipment: Shower seat;Grab bars - tub/shower;Cane - single point;Rollator (4 wheels) ?   ?  ?Prior Function Prior Level of  Function : Independent/Modified Independent ?  ?  ?  ?  ?  ?  ?Mobility Comments: Uses a SPC for household mobility. Will use walker for limited community distances.  No home O2. ?ADLs Comments: Tries to stay as independent as possible. Occasionally requires assistance with LB dressing and foot care. Sees podiatrist for regular foot care. Denies falls. ?  ? ? ?Hand Dominance  ? Dominant Hand: Right ? ?  ?Extremity/Trunk Assessment  ? Upper Extremity Assessment ?Upper Extremity Assessment: Overall WFL for tasks assessed ?  ? ?Lower Extremity Assessment ?Lower Extremity Assessment: Overall WFL for tasks assessed (grossly at least 4/5 throughout) ?  ? ?   ?Communication  ? Communication: No difficulties  ?Cognition Arousal/Alertness: Awake/alert ?Behavior During Therapy: [facility] for tasks assessed/performed ?Overall Cognitive Status: Within Functional Limits for tasks assessed ?  ?  ?  ?  ?  ?  ?  ?  ?  ?  ?  ?  ?  ?  ?  ?  ?  ?  ?  ? ?  ?General Comments   ? ?  ?Exercises Other Exercises ?Other Exercises: Reviewed rold of PT and progressive mobility; safety with transfers and gait. Patient/wife voiced understanding.  ? ?Assessment/Plan  ?  ?PT Assessment Patient needs continued PT services  ?PT Problem List Decreased strength;Decreased activity tolerance;Decreased balance;Decreased mobility;Cardiopulmonary status limiting activity ? ?   ?  ?PT Treatment Interventions DME instruction;Gait training;Functional mobility training;Balance training;Therapeutic activities;Therapeutic exercise;Patient/family education   ? ?PT Goals (Current goals can be found in the Care Plan section)  ?Acute Rehab PT Goals ?Patient Stated Goal: to go back home ?PT Goal Formulation: With patient/family ?Time For Goal Achievement: 06/03/21 ?Potential to Achieve Goals: Good ? ?  ?Frequency Min 2X/week ?  ? ? ?Co-evaluation   ?  ?  ?  ?  ? ? ?  ?AM-PAC PT "6 Clicks" Mobility  ?Outcome Measure Help needed turning from your back to your side while in a  flat bed without using bedrails?: None ?Help needed moving from lying on your back to sitting on the side of a flat bed without using bedrails?: None ?Help needed moving to and from a bed to a chair (including a wheelchair)?: A Little ?Help needed standing up from a chair using your arms (e.g., wheelchair or bedside chair)?: A Little ?Help needed to walk in hospital room?: A Little ?Help needed climbing 3-5 steps with a railing? : A Little ?6 Click Score: 20 ? ?  ?End of Session Equipment Utilized During Treatment: Gait belt ?Activity Tolerance: Patient tolerated treatment well ?Patient left: in bed;with call bell/phone within reach (with transport for transfer to floor) ?Nurse Communication: Mobility status ?PT Visit Diagnosis: Muscle weakness (generalized) (M62.81);Difficulty in walking, not elsewhere classified (R26.2) ?  ? ?Time: 4944-9675 ?PT Time Calculation (min) (ACUTE ONLY): 27 min ? ? ?Charges:   PT Evaluation ?$PT Eval Moderate Complexity: 1 Mod ?  ?  ?   ? ?Aylen Rambert H. Manson Passey, PT, DPT, NCS ?05/20/21, 3:53 PM ?[phone] ? ?

## 2021-05-20 NOTE — Assessment & Plan Note (Addendum)
.    Secondary to sepsis.  Creatinine at 1.26 on admission.  Down to 0.91 by day of discharge. ?

## 2021-05-20 NOTE — Assessment & Plan Note (Addendum)
Blood pressure stable.  Continue home medications on discharge ?

## 2021-05-20 NOTE — ED Notes (Addendum)
Blood bank called stating that the pink top for the type and screen is hemolyzed again. Per Consulting civil engineer, lab is to come do the redraw. This message given to blood bank with verbal unsderstanding. ?

## 2021-05-20 NOTE — ED Notes (Addendum)
This RN at bedside, pt repositioned and breakfast tray set up for pt to consume.  ?

## 2021-05-20 NOTE — Assessment & Plan Note (Addendum)
CBG stable during hospitalization, ranging from 100-180 ?

## 2021-05-21 DIAGNOSIS — J9601 Acute respiratory failure with hypoxia: Secondary | ICD-10-CM

## 2021-05-21 DIAGNOSIS — J189 Pneumonia, unspecified organism: Secondary | ICD-10-CM

## 2021-05-21 DIAGNOSIS — A419 Sepsis, unspecified organism: Secondary | ICD-10-CM

## 2021-05-21 DIAGNOSIS — I480 Paroxysmal atrial fibrillation: Secondary | ICD-10-CM | POA: Diagnosis not present

## 2021-05-21 DIAGNOSIS — R6520 Severe sepsis without septic shock: Secondary | ICD-10-CM

## 2021-05-21 LAB — CBC
HCT: 32.4 % — ABNORMAL LOW (ref 39.0–52.0)
Hemoglobin: 10.2 g/dL — ABNORMAL LOW (ref 13.0–17.0)
MCH: 30.6 pg (ref 26.0–34.0)
MCHC: 31.5 g/dL (ref 30.0–36.0)
MCV: 97.3 fL (ref 80.0–100.0)
Platelets: 137 10*3/uL — ABNORMAL LOW (ref 150–400)
RBC: 3.33 MIL/uL — ABNORMAL LOW (ref 4.22–5.81)
RDW: 13.5 % (ref 11.5–15.5)
WBC: 5.3 10*3/uL (ref 4.0–10.5)
nRBC: 0 % (ref 0.0–0.2)

## 2021-05-21 LAB — BASIC METABOLIC PANEL
Anion gap: 7 (ref 5–15)
BUN: 28 mg/dL — ABNORMAL HIGH (ref 8–23)
CO2: 29 mmol/L (ref 22–32)
Calcium: 8.8 mg/dL — ABNORMAL LOW (ref 8.9–10.3)
Chloride: 106 mmol/L (ref 98–111)
Creatinine, Ser: 0.91 mg/dL (ref 0.61–1.24)
GFR, Estimated: >60 mL/min (ref >60)
Glucose, Bld: 153 mg/dL — ABNORMAL HIGH (ref 70–99)
Potassium: 3.9 mmol/L (ref 3.5–5.1)
Sodium: 142 mmol/L (ref 135–145)

## 2021-05-21 LAB — GLUCOSE, CAPILLARY: Glucose-Capillary: 152 mg/dL — ABNORMAL HIGH (ref 70–99)

## 2021-05-21 LAB — PROTIME-INR
INR: 3.6 — ABNORMAL HIGH (ref 0.8–1.2)
Prothrombin Time: 35.8 seconds — ABNORMAL HIGH (ref 11.4–15.2)

## 2021-05-21 MED ORDER — PREDNISONE 10 MG PO TABS: 10.0000 mg | ORAL_TABLET | Freq: Every day | ORAL | 0 refills | Status: AC

## 2021-05-21 MED ORDER — CEFPODOXIME PROXETIL 100 MG PO TABS: 100.0000 mg | ORAL_TABLET | Freq: Two times a day (BID) | ORAL | 0 refills | Status: AC

## 2021-05-21 MED ORDER — GUAIFENESIN-DM 100-10 MG/5ML PO SYRP: 5.0000 mL | ORAL_SOLUTION | ORAL | 0 refills | Status: DC | PRN

## 2021-05-21 MED ORDER — AZITHROMYCIN 500 MG PO TABS: 500.0000 mg | ORAL_TABLET | Freq: Every day | ORAL | 0 refills | Status: AC

## 2021-05-21 NOTE — Consult Note (Signed)
ANTICOAGULATION CONSULT NOTE - Initial Consult ? ?Pharmacy Consult for warfarin ?Indication: atrial fibrillation ? ?Allergies  ?Allergen Reactions  ? Methyldopa Nausea Only and Palpitations  ?  Other reaction(s): Unknown ?Other reaction(s): NAUSEA ?'palpitation" ?Other reaction(s): NAUSEA ?Other reaction(s): NAUSEA ?  ? ? ?Patient Measurements: ?Height: 5\' 8"  (172.7 cm) ?Weight: 77.1 kg (170 lb) ?IBW/kg (Calculated) : 68.4 ?Heparin Dosing Weight: 77.1 kg ? ?Vital Signs: ?Temp: 98.1 ?F (36.7 ?C) (04/19 01-19-1994) ?Temp Source: Oral (04/19 0405) ?BP: 139/55 (04/19 0806) ?Pulse Rate: 67 (04/19 0806) ? ?Labs: ?Recent Labs  ?  05/19/21 ?2123 05/19/21 ?2218 05/19/21 ?2350 05/21/21 ?0444  ?HGB 10.6*  --   --  10.2*  ?HCT 33.5*  --   --  32.4*  ?PLT 171  --   --  137*  ?LABPROT  --  53.5*  --  35.8*  ?INR  --  6.1*  --  3.6*  ?CREATININE 1.26*  --   --  0.91  ?TROPONINIHS 186*  --  190*  --   ? ? ? ?Estimated Creatinine Clearance: 58.5 mL/min (by C-G formula based on SCr of 0.91 mg/dL). ? ? ?Medical History: ?Past Medical History:  ?Diagnosis Date  ? CHF (congestive heart failure) (HCC)   ? Diabetes mellitus without complication (HCC)   ? Essential hypertension   ? Mixed hyperlipidemia   ? Permanent atrial fibrillation (HCC)   ? a. s/p catheter ablation ~ 12 yrs ago @ [facility] per family->unsuccessful; b. CHA2DS2VASc = 5-->chronic coumadin.  ? ? ?Medications:  ?Warfarin 1.25mg  MWF, 2.5mg  TuThSaSu ? ?Assessment: ?Pharmacy has been consulted to monitor and dose Warfarin in 86yo patient with history of atrial fibrillation admitted with acute hypoxic respiratory failure. Initial troponin level of 190. Patient's home dose is stated above, with last dose taken being 2.5mg  on 05/19/21.  ? ?Baseline labs: INR 6.1, Hgb 10.6, Plts 171 ? ? ?Date INR Dose  ?4/17 6.1 Held  ?4/19 3.6 Held  ? ?DDI: Azithromycin (increased INR) ? ?Goal of Therapy:  ?INR 2-3 ?Monitor platelets by anticoagulation protocol: Yes ?  ?Plan:  ?- INR 3.6 on 4/19@0600 .  No signs of bleeding per attending. Hold warfarin today, expect INR to trend down tomorrow. ?- Repeat INR with AM labs tomorrow ? ?5/19, PharmD Candidate ?05/21/2021, 8:33 AM ? ? ?

## 2021-05-21 NOTE — Progress Notes (Signed)
At bedside shift report, patient found awake, very angry and upset. States he wants to see doctor now. He wants to go home. He has been sitting here waiting. He is argumentative with RN, Comptroller, and night shift Charity fundraiser. He can be heard from nurses station arguing with visitor. MD notified through secure chat. ?

## 2021-05-21 NOTE — Discharge Summary (Signed)
?Physician Discharge Summary ?  ?Patient: Andrew [name] MRN: [id number] DOB: Aug 09, 1935  ?Admit date:     05/19/2021  ?Discharge date: 05/21/21  ?Discharge Physician: Annita Brod  ? ?PCP: Baxter Hire, MD  ? ?Recommendations at discharge:  ? ?New medication: Zithromax 500 p.o. x3 days ?New medication: Vantin 100 mg twice daily x3 days ?Patient will be discharged home with home health therapy ?New medication: Prednisone 10 mg p.o. daily x3 days ?New medication: Robitussin-DM p.o. every 6 hours as needed for cough ? ?Discharge Diagnoses: ?Principal Problem: ?  Severe sepsis ([place]) ?Active Problems: ?  Acute respiratory failure with hypoxia ([place]) ?  CAP (community acquired pneumonia) ?  AKI (acute kidney injury) ([place]) ?  Paroxysmal atrial fibrillation (HCC) ?  Chronic anticoagulation ?  Anemia ?  Chronic diastolic (congestive) heart failure (HCC) ?  HTN (hypertension) ?  DM (diabetes mellitus) ([place]) ? ?Resolved Problems: ?  * No resolved hospital problems. * ? ?Hospital Course: ?86 year old male with history of diastolic CHF, DM2, HTN, A-fib status post ablation about 12 years ago currently on Coumadin, pacemaker, dementia, comes to the hospital with complaints of shortness of breath.  Wife is at bedside and reports that he started having shortness of breath, cough, congestion and felt warm.  His symptoms progressed and about couple hours later she realized that he has a fever, was breathing fast and was brought to the emergency room.  He was found to have a left lower lobe infiltrate on chest x-ray, elevated WBC, he was hypoxic on room air requiring supplemental oxygen, he was placed on antibiotics for community-acquired pneumonia and admitted to the hospital. ? ?Over the next few days, breathing slowly improved and patient able to be weaned off of oxygen.  By 4/19, able to ambulate on room air and keep oxygen saturations above 92%. ? ?Assessment and Plan: ?* Severe sepsis ([place]) ?Patient met criteria for  severe sepsis on admission given leukocytosis, tachypnea, pneumonia source with lactic acid level of 3.8.  Treated aggressively with IV fluids and antibiotics.  By 4/18, lactic acidosis had improved.  By 4/19, patient no longer hypoxic. ? ?SOB (shortness of breath) ?. ? ?CAP (community acquired pneumonia) ?Treated with Rocephin and Zithromax IV during hospitalization.  Discharged on p.o. Zithromax and Vantin. ? ?Acute respiratory failure with hypoxia ([place]) ?Secondary to pneumonia.  Improved with antibiotics and nebulizers.  Able to ambulate on room air by day of discharge without dropping his oxygen saturation ? ?AKI (acute kidney injury) ([place]) ?Marland Kitchen  Secondary to sepsis.  Creatinine at 1.26 on admission.  Down to 0.91 by day of discharge. ? ?Paroxysmal atrial fibrillation ([place]) ?Rate controlled.  Initially supratherapeutic.  INR trended downward and by day of discharge, INR down to 3.6.  He will resume his Coumadin on 4/20. ? ?Chronic anticoagulation ?. ? ?Anemia ?. ? ?Chronic diastolic (congestive) heart failure ([place]) ?Initially dehydrated and dry.  Received IV fluids.  Euvolemic on discharge. ? ?HTN (hypertension) ?Blood pressure stable.  Continue home medications on discharge ? ?DM (diabetes mellitus) ([place]) ?CBG stable during hospitalization, ranging from 100-180 ? ? ? ? ?  ? ? ?Consultants: None ?Procedures performed: None ?Disposition: Home ?Diet recommendation:  ?Discharge Diet Orders (From admission, onward)  ? ?  Start     Ordered  ? 05/21/21 0000  Diet - low sodium heart healthy       ? 05/21/21 1026  ? ?  ?  ? ?  ? ?Cardiac and Carb modified diet ?DISCHARGE  MEDICATION: ?Allergies as of 05/21/2021   ? ?   Reactions  ? Methyldopa Nausea Only, Palpitations  ? Other reaction(s): Unknown ?Other reaction(s): NAUSEA ?'palpitation" ?Other reaction(s): NAUSEA ?Other reaction(s): NAUSEA  ? ?  ? ?  ?Medication List  ?  ? ?TAKE these medications   ? ?amLODipine 2.5 MG tablet ?Commonly known as: NORVASC ?Take 2.5 mg by  mouth daily. ?What changed: Another medication with the same name was removed. Continue taking this medication, and follow the directions you see here. ?  ?azithromycin 500 MG tablet ?Commonly known as: Zithromax ?Take 1 tablet (500 mg total) by mouth daily for 3 days. ?  ?cefpodoxime 100 MG tablet ?Commonly known as: VANTIN ?Take 1 tablet (100 mg total) by mouth 2 (two) times daily for 3 days. ?  ?donepezil 10 MG tablet ?Commonly known as: ARICEPT ?Take 10 mg by mouth at bedtime. ?  ?doxazosin 4 MG tablet ?Commonly known as: CARDURA ?Take 4 mg by mouth Nightly. ?  ?furosemide 80 MG tablet ?Commonly known as: LASIX ?Take 80 mg by mouth daily. ?  ?glimepiride 2 MG tablet ?Commonly known as: AMARYL ?Take 4 mg by mouth daily. ?  ?guaiFENesin-dextromethorphan 100-10 MG/5ML syrup ?Commonly known as: ROBITUSSIN DM ?Take 5 mLs by mouth every 4 (four) hours as needed for cough. ?  ?lisinopril 20 MG tablet ?Commonly known as: ZESTRIL ?Take 20 mg by mouth daily. ?  ?memantine 10 MG tablet ?Commonly known as: NAMENDA ?Take 10 mg by mouth daily. ?  ?metFORMIN 1000 MG tablet ?Commonly known as: GLUCOPHAGE ?Take 1,000 mg by mouth 2 (two) times daily. ?  ?pioglitazone 15 MG tablet ?Commonly known as: ACTOS ?Take 15 mg by mouth daily. ?  ?Potassium 99 MG Tabs ?Take 1.5 tablets by mouth daily. ?  ?predniSONE 10 MG tablet ?Commonly known as: DELTASONE ?Take 1 tablet (10 mg total) by mouth daily for 5 days. ?  ?simvastatin 20 MG tablet ?Commonly known as: ZOCOR ?Take 20 mg by mouth Nightly. ?  ?vitamin B-12 1000 MCG tablet ?Commonly known as: CYANOCOBALAMIN ?Take 1,000 mcg by mouth daily. ?  ?warfarin 2.5 MG tablet ?Commonly known as: COUMADIN ?Take 1.25-2.5 mg by mouth as directed. Take 1/2 tablet (1.25 mg) on Monday, Wednesday and Friday. Take 1 tablet (2.5 mg) on Sunday, Tuesday, Thursday and Saturday. ?  ? ?  ? ? ?Discharge Exam: ?Filed Weights  ? 05/19/21 2113  ?Weight: 77.1 kg  ? ?General: Alert and oriented x2, no acute  distress ?Cardiovascular: Irregular rhythm, rate controlled ?Lungs: Minimal end expiratory wheeze ? ?Condition at discharge: good ? ?The results of significant diagnostics from this hospitalization (including imaging, microbiology, ancillary and laboratory) are listed below for reference.  ? ?Imaging Studies: ?DG Chest 2 View ? ?Result Date: 05/19/2021 ?CLINICAL DATA:  Shortness of breath. EXAM: CHEST - 2 VIEW COMPARISON:  Chest radiograph dated 10/07/2018. FINDINGS: Left lung base/retrocardiac opacity most consistent with developing infiltrate. Clinical correlation and follow-up to resolution recommended. Minimal right lung base atelectasis or infiltrate. No pleural effusion or pneumothorax. Mild cardiomegaly. Atherosclerotic calcification of the aorta. Left pectoral pacemaker device. No acute osseous pathology. IMPRESSION: 1. Developing left lower lobe infiltrate. 2. Minimal right lung base atelectasis or infiltrate. Electronically Signed   By: Anner Crete M.D.   On: 05/19/2021 22:12   ? ?Microbiology: ?Results for orders placed or performed during the hospital encounter of 05/19/21  ?Resp Panel by RT-PCR (Flu A&B, Covid) Nasopharyngeal Swab     Status: None  ? Collection Time: 05/19/21  9:22 PM  ? Specimen: Nasopharyngeal Swab; Nasopharyngeal(NP) swabs in vial transport medium  ?Result Value Ref Range Status  ? SARS Coronavirus 2 by RT PCR NEGATIVE NEGATIVE Final  ?  Comment: (NOTE) ?SARS-CoV-2 target nucleic acids are NOT DETECTED. ? ?The SARS-CoV-2 RNA is generally detectable in upper respiratory ?specimens during the acute phase of infection. The lowest ?concentration of SARS-CoV-2 viral copies this assay can detect is ?138 copies/mL. A negative result does not preclude SARS-Cov-2 ?infection and should not be used as the sole basis for treatment or ?other patient management decisions. A negative result may occur with  ?improper specimen collection/handling, submission of specimen other ?than nasopharyngeal  swab, presence of viral mutation(s) within the ?areas targeted by this assay, and inadequate number of viral ?copies(<138 copies/mL). A negative result must be combined with ?clinical observations, patient histor

## 2021-05-21 NOTE — Assessment & Plan Note (Signed)
Treated with Rocephin and Zithromax IV during hospitalization.  Discharged on p.o. Zithromax and Vantin. ?

## 2021-05-21 NOTE — TOC Progression Note (Addendum)
Transition of Care (TOC) - Progression Note  ? ? ?Patient Details  ?Name: Andrew [name] ?MRN: [id number] ?Date of Birth: 1935-12-01 ? ?Transition of Care (TOC) CM/SW Contact  ?Natisha Trzcinski A Mariusz Jubb, LCSW ?Phone Number: ?05/21/2021, 10:55 AM ? ?Clinical Narrative: Adelina Mings with [facility] notified of referral and will service at d/c.    ? ? ? ?Expected dc plan: home with home health ?Barriers to Discharge: Continued Medical Work up ? ?Expected Discharge Plan and Services ?Expected Discharge Plan: Skilled Nursing Facility ?  ?Discharge Planning Services: CM Consult ?  ?Living arrangements for the past 2 months: Single Family Home ?Expected Discharge Date: 05/21/21               ?  ?  ?  ?  ?  ?  ?  ?  ?  ?  ? ? ?Social Determinants of Health (SDOH) Interventions ?  ? ?Readmission Risk Interventions ? ?  10/08/2018  ? 11:12 AM  ?Readmission Risk Prevention Plan  ?Transportation Screening Complete  ?PCP or Specialist Appt within 5-7 Days Complete  ?Home Care Screening Complete  ?Medication Review (RN CM) Complete  ? ? ?

## 2021-05-21 NOTE — Hospital Course (Signed)
86 year old male with history of diastolic CHF, DM2, HTN, A-fib status post ablation about 12 years ago currently on Coumadin, pacemaker, dementia, comes to the hospital with complaints of shortness of breath.  Wife is at bedside and reports that he started having shortness of breath, cough, congestion and felt warm.  His symptoms progressed and about couple hours later she realized that he has a fever, was breathing fast and was brought to the emergency room.  He was found to have a left lower lobe infiltrate on chest x-ray, elevated WBC, he was hypoxic on room air requiring supplemental oxygen, he was placed on antibiotics for community-acquired pneumonia and admitted to the hospital. ? ?Over the next few days, breathing slowly improved and patient able to be weaned off of oxygen.  By 4/19, able to ambulate on room air and keep oxygen saturations above 92%. ?

## 2021-05-24 LAB — CULTURE, BLOOD (ROUTINE X 2)
Culture: NO GROWTH
Culture: NO GROWTH
Special Requests: ADEQUATE
Special Requests: ADEQUATE

## 2021-05-30 ENCOUNTER — Emergency Department
Admission: EM | Admit: 2021-05-30 | Discharge: 2021-05-30 | Disposition: A | Discharge status: Home / Self Care | Payer: Medicare HMO | Attending: Emergency Medicine | Admitting: Emergency Medicine

## 2021-05-30 ENCOUNTER — Other Ambulatory Visit: Payer: Self-pay

## 2021-05-30 DIAGNOSIS — R791 Abnormal coagulation profile: Secondary | ICD-10-CM | POA: Diagnosis not present

## 2021-05-30 DIAGNOSIS — Z7901 Long term (current) use of anticoagulants: Secondary | ICD-10-CM | POA: Insufficient documentation

## 2021-05-30 DIAGNOSIS — I4892 Unspecified atrial flutter: Secondary | ICD-10-CM | POA: Diagnosis not present

## 2021-05-30 DIAGNOSIS — I4891 Unspecified atrial fibrillation: Secondary | ICD-10-CM | POA: Diagnosis not present

## 2021-05-30 LAB — BASIC METABOLIC PANEL
Anion gap: 10 (ref 5–15)
BUN: 28 mg/dL — ABNORMAL HIGH (ref 8–23)
CO2: 32 mmol/L (ref 22–32)
Calcium: 9.2 mg/dL (ref 8.9–10.3)
Chloride: 98 mmol/L (ref 98–111)
Creatinine, Ser: 1.16 mg/dL (ref 0.61–1.24)
GFR, Estimated: >60 mL/min (ref >60)
Glucose, Bld: 403 mg/dL — ABNORMAL HIGH (ref 70–99)
Potassium: 3.9 mmol/L (ref 3.5–5.1)
Sodium: 140 mmol/L (ref 135–145)

## 2021-05-30 LAB — CBC WITH DIFFERENTIAL/PLATELET
Abs Immature Granulocytes: 0.03 10*3/uL (ref 0.00–0.07)
Basophils Absolute: 0 10*3/uL (ref 0.0–0.1)
Basophils Relative: 0 %
Eosinophils Absolute: 0.1 10*3/uL (ref 0.0–0.5)
Eosinophils Relative: 1 %
HCT: 35.6 % — ABNORMAL LOW (ref 39.0–52.0)
Hemoglobin: 11.1 g/dL — ABNORMAL LOW (ref 13.0–17.0)
Immature Granulocytes: 0 %
Lymphocytes Relative: 17 %
Lymphs Abs: 1.2 10*3/uL (ref 0.7–4.0)
MCH: 30.3 pg (ref 26.0–34.0)
MCHC: 31.2 g/dL (ref 30.0–36.0)
MCV: 97.3 fL (ref 80.0–100.0)
Monocytes Absolute: 0.5 10*3/uL (ref 0.1–1.0)
Monocytes Relative: 6 %
Neutro Abs: 5.3 10*3/uL (ref 1.7–7.7)
Neutrophils Relative %: 76 %
Platelets: 163 10*3/uL (ref 150–400)
RBC: 3.66 MIL/uL — ABNORMAL LOW (ref 4.22–5.81)
RDW: 14 % (ref 11.5–15.5)
WBC: 7 10*3/uL (ref 4.0–10.5)
nRBC: 0 % (ref 0.0–0.2)

## 2021-05-30 LAB — PROTIME-INR
INR: 4.2 (ref 0.8–1.2)
Prothrombin Time: 40 seconds — ABNORMAL HIGH (ref 11.4–15.2)

## 2021-05-30 MED ORDER — PHYTONADIONE 5 MG PO TABS
2.5000 mg | ORAL_TABLET | Freq: Once | ORAL | Status: AC
  Administered 2021-05-30: 2.5 mg via ORAL
  Filled 2021-05-30: qty 1

## 2021-05-30 NOTE — ED Triage Notes (Signed)
Pt arrives with c/o of elevated INR. Pt was called by doctor office stating his INR is 6.  ?

## 2021-05-30 NOTE — ED Provider Notes (Signed)
?  Emergency Medicine Provider Triage Evaluation Note ? ?Andrew [name] , a 86 y.o.male,  was evaluated in triage.  Pt complains of abnormal lab.  Patient states that he visited [facility] today for a checkup and found that his INR was 6.  He was sent over here for a vitamin K shot he is currently asymptomatic at this time ? ? ?Review of Systems  ?Positive: None. ?Negative: Denies fever, chest pain, vomiting ? ?Physical Exam  ? ?Vitals:  ? 05/30/21 1631  ?BP: (!) 147/67  ?Pulse: 65  ?Resp: 16  ?Temp: 98 ?F (36.7 ?C)  ?SpO2: 93%  ? ?Gen:   Awake, no distress   ?Resp:  Normal effort  ?MSK:   Moves extremities without difficulty  ?Other:   ? ?Medical Decision Making  ?Given the patient's initial medical screening exam, the following diagnostic evaluation has been ordered. The patient will be placed in the appropriate treatment space, once one is available, to complete the evaluation and treatment. I have discussed the plan of care with the patient and I have advised the patient that an ED physician or mid-level practitioner will reevaluate their condition after the test results have been received, as the results may give them additional insight into the type of treatment they may need.  ? ? ?Diagnostics: CBC, BMP, INR. ? ?Treatments: none immediately ?  ?Varney Daily, [place] ?05/30/21 1633 ? ?  ?Shaune Pollack, MD ?05/30/21 1647 ? ?

## 2021-05-30 NOTE — Discharge Instructions (Signed)
Follow-up with Dr. Edwina Barth on Monday for an INR recheck.  Return to the ER for any abnormal bleeding or any other new or worsening symptoms that concern you. ?

## 2021-05-30 NOTE — ED Provider Notes (Signed)
? ?[facility] ?Provider Note ? ? ? Event Date/Time  ? First MD Initiated Contact with Patient 05/30/21 1723   ?  (approximate) ? ? ?History  ? ?Abnormal Lab ? ? ?HPI ? ?Andrew [name] is a 86 y.o. male with a history of atrial fibrillation on Coumadin who presents with an elevated INR reading of 6 today on outpatient labs.  He was instructed to come to the ED for vitamin K administration since his PMD was unable to arrange it outpatient.  The patient himself has no acute complaints.  He denies any abnormal bleeding or bruising. ? ?  ? ? ?Physical Exam  ? ?Triage Vital Signs: ?ED Triage Vitals  ?Enc Vitals Group  ?   BP 05/30/21 1631 (!) 147/67  ?   Pulse Rate 05/30/21 1631 65  ?   Resp 05/30/21 1631 16  ?   Temp 05/30/21 1631 98 ?F (36.7 ?C)  ?   Temp Source 05/30/21 1631 Oral  ?   SpO2 05/30/21 1631 93 %  ?   Weight 05/30/21 1632 170 lb (77.1 kg)  ?   Height 05/30/21 1632 5\' 8"  (1.727 m)  ?   Head Circumference --   ?   Peak Flow --   ?   Pain Score 05/30/21 1632 0  ?   Pain Loc --   ?   Pain Edu? --   ?   Excl. in GC? --   ? ? ?Most recent vital signs: ?Vitals:  ? 05/30/21 1631 05/30/21 1808  ?BP: (!) 147/67 138/66  ?Pulse: 65 (!) 57  ?Resp: 16 18  ?Temp: 98 ?F (36.7 ?C)   ?SpO2: 93% 97%  ? ? ? ?General: Awake, no distress.  ?CV:  Good peripheral perfusion.  ?Resp:  Normal effort.  ?Abd:  No distention.  ?Other:  No edema. ? ? ?ED Results / Procedures / Treatments  ? ?Labs ?(all labs ordered are listed, but only abnormal results are displayed) ?Labs Reviewed  ?CBC WITH DIFFERENTIAL/PLATELET - Abnormal; Notable for the following components:  ?    Result Value  ? RBC 3.66 (*)   ? Hemoglobin 11.1 (*)   ? HCT 35.6 (*)   ? All other components within normal limits  ?PROTIME-INR - Abnormal; Notable for the following components:  ? Prothrombin Time 40.0 (*)   ? INR 4.2 (*)   ? All other components within normal limits  ?BASIC METABOLIC PANEL - Abnormal; Notable for the following components:  ?  Glucose, Bld 403 (*)   ? BUN 28 (*)   ? All other components within normal limits  ? ? ? ?EKG ? ? ? ? ?RADIOLOGY ? ? ? ?PROCEDURES: ? ?Critical Care performed: No ? ?Procedures ? ? ?MEDICATIONS ORDERED IN ED: ?Medications  ?phytonadione (VITAMIN K) tablet 2.5 mg (2.5 mg Oral Given 05/30/21 1803)  ? ? ? ?IMPRESSION / MDM / ASSESSMENT AND PLAN / ED COURSE  ?I reviewed the triage vital signs and the nursing notes. ? ?86 year old male with history of atrial fibrillation on Coumadin presents due to elevated INR I reviewed the past medical records including the note from his PMD Dr. 97 from earlier today which indicates an INR of 6.1.  The patient was instructed to come to the ED for administration of p.o. vitamin K and also was instructed to hold the Coumadin today, tomorrow, and Sunday with a plan to recheck the INR Monday. ? ?On exam the patient is well-appearing.  His vital  signs are normal except for minimal hypertension.  Physical exam is otherwise unremarkable. ? ?We will obtain basic labs and repeat INR here.  I have ordered p.o. vitamin K. ? ?----------------------------------------- ?6:35 PM on 05/30/2021 ?----------------------------------------- ? ?Vitamin K was given, and the INR has actually already improved.  The patient is stable for discharge home.  Return precautions provided, and he expresses understanding. ? ?FINAL CLINICAL IMPRESSION(S) / ED DIAGNOSES  ? ?Final diagnoses:  ?Elevated INR  ? ? ? ?Rx / DC Orders  ? ?ED Discharge Orders   ? ? None  ? ?  ? ? ? ?Note:  This document was prepared using Dragon voice recognition software and may include unintentional dictation errors.  ?  Dionne Bucy, MD ?05/30/21 1835 ? ?

## 2021-06-02 DIAGNOSIS — E119 Type 2 diabetes mellitus without complications: Secondary | ICD-10-CM | POA: Diagnosis not present

## 2021-06-02 DIAGNOSIS — I4892 Unspecified atrial flutter: Secondary | ICD-10-CM | POA: Diagnosis not present

## 2021-06-02 DIAGNOSIS — I4891 Unspecified atrial fibrillation: Secondary | ICD-10-CM | POA: Diagnosis not present

## 2021-06-09 DIAGNOSIS — E119 Type 2 diabetes mellitus without complications: Secondary | ICD-10-CM | POA: Diagnosis not present

## 2021-06-09 DIAGNOSIS — I480 Paroxysmal atrial fibrillation: Secondary | ICD-10-CM | POA: Diagnosis not present

## 2021-06-09 DIAGNOSIS — J9601 Acute respiratory failure with hypoxia: Secondary | ICD-10-CM | POA: Diagnosis not present

## 2021-06-09 DIAGNOSIS — A419 Sepsis, unspecified organism: Secondary | ICD-10-CM | POA: Diagnosis not present

## 2021-06-09 DIAGNOSIS — J181 Lobar pneumonia, unspecified organism: Secondary | ICD-10-CM | POA: Diagnosis not present

## 2021-06-09 DIAGNOSIS — I5032 Chronic diastolic (congestive) heart failure: Secondary | ICD-10-CM | POA: Diagnosis not present

## 2021-06-09 DIAGNOSIS — I110 Hypertensive heart disease with heart failure: Secondary | ICD-10-CM | POA: Diagnosis not present

## 2021-06-11 DIAGNOSIS — Z7901 Long term (current) use of anticoagulants: Secondary | ICD-10-CM | POA: Diagnosis not present

## 2021-06-11 DIAGNOSIS — I4891 Unspecified atrial fibrillation: Secondary | ICD-10-CM | POA: Diagnosis not present

## 2021-06-11 DIAGNOSIS — I4892 Unspecified atrial flutter: Secondary | ICD-10-CM | POA: Diagnosis not present

## 2021-06-16 DIAGNOSIS — Z7901 Long term (current) use of anticoagulants: Secondary | ICD-10-CM | POA: Diagnosis not present

## 2021-06-16 DIAGNOSIS — B351 Tinea unguium: Secondary | ICD-10-CM | POA: Diagnosis not present

## 2021-06-16 DIAGNOSIS — I4892 Unspecified atrial flutter: Secondary | ICD-10-CM | POA: Diagnosis not present

## 2021-06-16 DIAGNOSIS — E119 Type 2 diabetes mellitus without complications: Secondary | ICD-10-CM | POA: Diagnosis not present

## 2021-06-16 DIAGNOSIS — I4891 Unspecified atrial fibrillation: Secondary | ICD-10-CM | POA: Diagnosis not present

## 2021-06-25 DIAGNOSIS — I4892 Unspecified atrial flutter: Secondary | ICD-10-CM | POA: Diagnosis not present

## 2021-06-25 DIAGNOSIS — I4891 Unspecified atrial fibrillation: Secondary | ICD-10-CM | POA: Diagnosis not present

## 2021-07-03 ENCOUNTER — Ambulatory Visit (INDEPENDENT_AMBULATORY_CARE_PROVIDER_SITE_OTHER): Payer: Medicare HMO

## 2021-07-03 DIAGNOSIS — I5032 Chronic diastolic (congestive) heart failure: Secondary | ICD-10-CM | POA: Diagnosis not present

## 2021-07-03 LAB — CUP PACEART REMOTE DEVICE CHECK
Battery Remaining Longevity: 62 mo
Battery Remaining Percentage: 70 %
Battery Voltage: 2.99 V
Brady Statistic RV Percent Paced: 89 %
Date Time Interrogation Session: 20230601020014
Implantable Lead Implant Date: 20200221
Implantable Lead Location: 753860
Implantable Pulse Generator Implant Date: 20200221
Lead Channel Impedance Value: 540 Ohm
Lead Channel Pacing Threshold Amplitude: 0.75 V
Lead Channel Pacing Threshold Pulse Width: 0.5 ms
Lead Channel Sensing Intrinsic Amplitude: 12 mV
Lead Channel Setting Pacing Amplitude: 3.5 V
Lead Channel Setting Pacing Pulse Width: 0.5 ms
Lead Channel Setting Sensing Sensitivity: 2 mV
Pulse Gen Model: 1272
Pulse Gen Serial Number: 9114203

## 2021-07-07 DIAGNOSIS — L89151 Pressure ulcer of sacral region, stage 1: Secondary | ICD-10-CM | POA: Diagnosis not present

## 2021-07-11 NOTE — Progress Notes (Signed)
Remote pacemaker transmission.   

## 2021-07-18 DIAGNOSIS — I4891 Unspecified atrial fibrillation: Secondary | ICD-10-CM | POA: Diagnosis not present

## 2021-07-18 DIAGNOSIS — I4892 Unspecified atrial flutter: Secondary | ICD-10-CM | POA: Diagnosis not present

## 2021-08-01 DIAGNOSIS — I4891 Unspecified atrial fibrillation: Secondary | ICD-10-CM | POA: Diagnosis not present

## 2021-08-01 DIAGNOSIS — I4892 Unspecified atrial flutter: Secondary | ICD-10-CM | POA: Diagnosis not present

## 2021-08-14 DIAGNOSIS — I4892 Unspecified atrial flutter: Secondary | ICD-10-CM | POA: Diagnosis not present

## 2021-08-14 DIAGNOSIS — I4891 Unspecified atrial fibrillation: Secondary | ICD-10-CM | POA: Diagnosis not present

## 2021-08-21 DIAGNOSIS — N390 Urinary tract infection, site not specified: Secondary | ICD-10-CM | POA: Diagnosis not present

## 2021-08-21 DIAGNOSIS — R399 Unspecified symptoms and signs involving the genitourinary system: Secondary | ICD-10-CM | POA: Diagnosis not present

## 2021-08-22 ENCOUNTER — Emergency Department: Payer: Medicare HMO

## 2021-08-22 ENCOUNTER — Inpatient Hospital Stay: Payer: Medicare HMO

## 2021-08-22 ENCOUNTER — Inpatient Hospital Stay
Admission: EM | Admit: 2021-08-22 | Discharge: 2021-09-01 | DRG: 870 | Disposition: A | Discharge status: Skilled Nursing Facility | Payer: Medicare HMO | Attending: Internal Medicine | Admitting: Internal Medicine

## 2021-08-22 DIAGNOSIS — Z20822 Contact with and (suspected) exposure to covid-19: Secondary | ICD-10-CM | POA: Diagnosis present

## 2021-08-22 DIAGNOSIS — R6521 Severe sepsis with septic shock: Secondary | ICD-10-CM | POA: Diagnosis present

## 2021-08-22 DIAGNOSIS — Z950 Presence of cardiac pacemaker: Secondary | ICD-10-CM

## 2021-08-22 DIAGNOSIS — G301 Alzheimer's disease with late onset: Secondary | ICD-10-CM | POA: Diagnosis not present

## 2021-08-22 DIAGNOSIS — M47814 Spondylosis without myelopathy or radiculopathy, thoracic region: Secondary | ICD-10-CM | POA: Diagnosis not present

## 2021-08-22 DIAGNOSIS — Z888 Allergy status to other drugs, medicaments and biological substances status: Secondary | ICD-10-CM

## 2021-08-22 DIAGNOSIS — M160 Bilateral primary osteoarthritis of hip: Secondary | ICD-10-CM | POA: Diagnosis not present

## 2021-08-22 DIAGNOSIS — R0603 Acute respiratory distress: Secondary | ICD-10-CM | POA: Diagnosis not present

## 2021-08-22 DIAGNOSIS — J9611 Chronic respiratory failure with hypoxia: Secondary | ICD-10-CM | POA: Diagnosis not present

## 2021-08-22 DIAGNOSIS — Z4682 Encounter for fitting and adjustment of non-vascular catheter: Secondary | ICD-10-CM | POA: Diagnosis not present

## 2021-08-22 DIAGNOSIS — J181 Lobar pneumonia, unspecified organism: Secondary | ICD-10-CM | POA: Diagnosis not present

## 2021-08-22 DIAGNOSIS — I6529 Occlusion and stenosis of unspecified carotid artery: Secondary | ICD-10-CM | POA: Diagnosis not present

## 2021-08-22 DIAGNOSIS — Z7401 Bed confinement status: Secondary | ICD-10-CM | POA: Diagnosis not present

## 2021-08-22 DIAGNOSIS — I5043 Acute on chronic combined systolic (congestive) and diastolic (congestive) heart failure: Secondary | ICD-10-CM | POA: Diagnosis present

## 2021-08-22 DIAGNOSIS — J9602 Acute respiratory failure with hypercapnia: Secondary | ICD-10-CM | POA: Diagnosis not present

## 2021-08-22 DIAGNOSIS — A419 Sepsis, unspecified organism: Secondary | ICD-10-CM | POA: Diagnosis not present

## 2021-08-22 DIAGNOSIS — K56600 Partial intestinal obstruction, unspecified as to cause: Secondary | ICD-10-CM | POA: Diagnosis not present

## 2021-08-22 DIAGNOSIS — I878 Other specified disorders of veins: Secondary | ICD-10-CM | POA: Diagnosis not present

## 2021-08-22 DIAGNOSIS — E440 Moderate protein-calorie malnutrition: Secondary | ICD-10-CM | POA: Insufficient documentation

## 2021-08-22 DIAGNOSIS — Z9981 Dependence on supplemental oxygen: Secondary | ICD-10-CM | POA: Diagnosis not present

## 2021-08-22 DIAGNOSIS — K56609 Unspecified intestinal obstruction, unspecified as to partial versus complete obstruction: Secondary | ICD-10-CM | POA: Diagnosis not present

## 2021-08-22 DIAGNOSIS — R042 Hemoptysis: Secondary | ICD-10-CM | POA: Diagnosis not present

## 2021-08-22 DIAGNOSIS — J9690 Respiratory failure, unspecified, unspecified whether with hypoxia or hypercapnia: Secondary | ICD-10-CM | POA: Diagnosis not present

## 2021-08-22 DIAGNOSIS — N179 Acute kidney failure, unspecified: Secondary | ICD-10-CM | POA: Diagnosis present

## 2021-08-22 DIAGNOSIS — R918 Other nonspecific abnormal finding of lung field: Secondary | ICD-10-CM | POA: Diagnosis not present

## 2021-08-22 DIAGNOSIS — Z6825 Body mass index (BMI) 25.0-25.9, adult: Secondary | ICD-10-CM

## 2021-08-22 DIAGNOSIS — K567 Ileus, unspecified: Secondary | ICD-10-CM | POA: Diagnosis not present

## 2021-08-22 DIAGNOSIS — I110 Hypertensive heart disease with heart failure: Secondary | ICD-10-CM | POA: Diagnosis present

## 2021-08-22 DIAGNOSIS — I700 Atherosclerosis of aorta: Secondary | ICD-10-CM | POA: Diagnosis not present

## 2021-08-22 DIAGNOSIS — Z66 Do not resuscitate: Secondary | ICD-10-CM | POA: Diagnosis not present

## 2021-08-22 DIAGNOSIS — Z87891 Personal history of nicotine dependence: Secondary | ICD-10-CM

## 2021-08-22 DIAGNOSIS — J189 Pneumonia, unspecified organism: Secondary | ICD-10-CM | POA: Diagnosis not present

## 2021-08-22 DIAGNOSIS — E1165 Type 2 diabetes mellitus with hyperglycemia: Secondary | ICD-10-CM | POA: Diagnosis present

## 2021-08-22 DIAGNOSIS — I248 Other forms of acute ischemic heart disease: Secondary | ICD-10-CM | POA: Diagnosis present

## 2021-08-22 DIAGNOSIS — J9600 Acute respiratory failure, unspecified whether with hypoxia or hypercapnia: Secondary | ICD-10-CM | POA: Diagnosis not present

## 2021-08-22 DIAGNOSIS — E119 Type 2 diabetes mellitus without complications: Secondary | ICD-10-CM | POA: Diagnosis not present

## 2021-08-22 DIAGNOSIS — Z79899 Other long term (current) drug therapy: Secondary | ICD-10-CM

## 2021-08-22 DIAGNOSIS — I959 Hypotension, unspecified: Secondary | ICD-10-CM | POA: Diagnosis not present

## 2021-08-22 DIAGNOSIS — D689 Coagulation defect, unspecified: Secondary | ICD-10-CM | POA: Diagnosis present

## 2021-08-22 DIAGNOSIS — J9612 Chronic respiratory failure with hypercapnia: Secondary | ICD-10-CM | POA: Diagnosis not present

## 2021-08-22 DIAGNOSIS — N390 Urinary tract infection, site not specified: Secondary | ICD-10-CM | POA: Diagnosis present

## 2021-08-22 DIAGNOSIS — K8020 Calculus of gallbladder without cholecystitis without obstruction: Secondary | ICD-10-CM | POA: Diagnosis not present

## 2021-08-22 DIAGNOSIS — R0902 Hypoxemia: Secondary | ICD-10-CM | POA: Diagnosis not present

## 2021-08-22 DIAGNOSIS — J9601 Acute respiratory failure with hypoxia: Secondary | ICD-10-CM | POA: Diagnosis present

## 2021-08-22 DIAGNOSIS — Z7901 Long term (current) use of anticoagulants: Secondary | ICD-10-CM

## 2021-08-22 DIAGNOSIS — E872 Acidosis, unspecified: Secondary | ICD-10-CM | POA: Diagnosis not present

## 2021-08-22 DIAGNOSIS — I4891 Unspecified atrial fibrillation: Secondary | ICD-10-CM | POA: Diagnosis not present

## 2021-08-22 DIAGNOSIS — I255 Ischemic cardiomyopathy: Secondary | ICD-10-CM | POA: Diagnosis present

## 2021-08-22 DIAGNOSIS — G9341 Metabolic encephalopathy: Secondary | ICD-10-CM | POA: Diagnosis present

## 2021-08-22 DIAGNOSIS — Z823 Family history of stroke: Secondary | ICD-10-CM

## 2021-08-22 DIAGNOSIS — D696 Thrombocytopenia, unspecified: Secondary | ICD-10-CM | POA: Diagnosis not present

## 2021-08-22 DIAGNOSIS — T380X5A Adverse effect of glucocorticoids and synthetic analogues, initial encounter: Secondary | ICD-10-CM | POA: Diagnosis present

## 2021-08-22 DIAGNOSIS — R579 Shock, unspecified: Secondary | ICD-10-CM

## 2021-08-22 DIAGNOSIS — J690 Pneumonitis due to inhalation of food and vomit: Secondary | ICD-10-CM | POA: Diagnosis present

## 2021-08-22 DIAGNOSIS — R609 Edema, unspecified: Secondary | ICD-10-CM | POA: Diagnosis not present

## 2021-08-22 DIAGNOSIS — R188 Other ascites: Secondary | ICD-10-CM | POA: Diagnosis present

## 2021-08-22 DIAGNOSIS — E782 Mixed hyperlipidemia: Secondary | ICD-10-CM | POA: Diagnosis present

## 2021-08-22 DIAGNOSIS — R6520 Severe sepsis without septic shock: Secondary | ICD-10-CM | POA: Diagnosis not present

## 2021-08-22 DIAGNOSIS — I4821 Permanent atrial fibrillation: Secondary | ICD-10-CM | POA: Diagnosis present

## 2021-08-22 DIAGNOSIS — I350 Nonrheumatic aortic (valve) stenosis: Secondary | ICD-10-CM | POA: Diagnosis present

## 2021-08-22 DIAGNOSIS — I428 Other cardiomyopathies: Secondary | ICD-10-CM | POA: Diagnosis not present

## 2021-08-22 DIAGNOSIS — Z8052 Family history of malignant neoplasm of bladder: Secondary | ICD-10-CM

## 2021-08-22 DIAGNOSIS — R109 Unspecified abdominal pain: Secondary | ICD-10-CM | POA: Diagnosis not present

## 2021-08-22 DIAGNOSIS — Z7984 Long term (current) use of oral hypoglycemic drugs: Secondary | ICD-10-CM | POA: Diagnosis not present

## 2021-08-22 DIAGNOSIS — I639 Cerebral infarction, unspecified: Secondary | ICD-10-CM | POA: Diagnosis not present

## 2021-08-22 DIAGNOSIS — I6782 Cerebral ischemia: Secondary | ICD-10-CM | POA: Diagnosis not present

## 2021-08-22 DIAGNOSIS — R4182 Altered mental status, unspecified: Secondary | ICD-10-CM | POA: Diagnosis present

## 2021-08-22 DIAGNOSIS — F0390 Unspecified dementia without behavioral disturbance: Secondary | ICD-10-CM | POA: Diagnosis present

## 2021-08-22 LAB — CREATININE, URINE, RANDOM: Creatinine, Urine: 79 mg/dL

## 2021-08-22 LAB — CBC WITH DIFFERENTIAL/PLATELET
Abs Immature Granulocytes: 0.04 10*3/uL (ref 0.00–0.07)
Basophils Absolute: 0 10*3/uL (ref 0.0–0.1)
Basophils Relative: 0 %
Eosinophils Absolute: 0 10*3/uL (ref 0.0–0.5)
Eosinophils Relative: 0 %
HCT: 32.3 % — ABNORMAL LOW (ref 39.0–52.0)
Hemoglobin: 10.5 g/dL — ABNORMAL LOW (ref 13.0–17.0)
Immature Granulocytes: 0 %
Lymphocytes Relative: 7 %
Lymphs Abs: 0.8 10*3/uL (ref 0.7–4.0)
MCH: 31.3 pg (ref 26.0–34.0)
MCHC: 32.5 g/dL (ref 30.0–36.0)
MCV: 96.4 fL (ref 80.0–100.0)
Monocytes Absolute: 0.6 10*3/uL (ref 0.1–1.0)
Monocytes Relative: 5 %
Neutro Abs: 9.9 10*3/uL — ABNORMAL HIGH (ref 1.7–7.7)
Neutrophils Relative %: 88 %
Platelets: 140 10*3/uL — ABNORMAL LOW (ref 150–400)
RBC: 3.35 MIL/uL — ABNORMAL LOW (ref 4.22–5.81)
RDW: 14.7 % (ref 11.5–15.5)
WBC: 11.3 10*3/uL — ABNORMAL HIGH (ref 4.0–10.5)
nRBC: 0 % (ref 0.0–0.2)

## 2021-08-22 LAB — URINALYSIS, ROUTINE W REFLEX MICROSCOPIC
Bilirubin Urine: NEGATIVE
Glucose, UA: 50 mg/dL — AB
Ketones, ur: NEGATIVE mg/dL
Leukocytes,Ua: NEGATIVE
Nitrite: NEGATIVE
Protein, ur: NEGATIVE mg/dL
Specific Gravity, Urine: 1.016 (ref 1.005–1.030)
pH: 5 (ref 5.0–8.0)

## 2021-08-22 LAB — HEPATIC FUNCTION PANEL
ALT: 16 U/L (ref 0–44)
AST: 36 U/L (ref 15–41)
Albumin: 3.2 g/dL — ABNORMAL LOW (ref 3.5–5.0)
Alkaline Phosphatase: 50 U/L (ref 38–126)
Bilirubin, Direct: 0.2 mg/dL (ref 0.0–0.2)
Indirect Bilirubin: 0.8 mg/dL (ref 0.3–0.9)
Total Bilirubin: 1 mg/dL (ref 0.3–1.2)
Total Protein: 5.8 g/dL — ABNORMAL LOW (ref 6.5–8.1)

## 2021-08-22 LAB — BLOOD GAS, VENOUS
Acid-base deficit: 5.7 mmol/L — ABNORMAL HIGH (ref 0.0–2.0)
Bicarbonate: 23.5 mmol/L (ref 20.0–28.0)
Delivery systems: POSITIVE
FIO2: 60 %
O2 Saturation: 57.3 %
Patient temperature: 37
pCO2, Ven: 66 mmHg — ABNORMAL HIGH (ref 44–60)
pH, Ven: 7.16 — CL (ref 7.25–7.43)
pO2, Ven: 42 mmHg (ref 32–45)

## 2021-08-22 LAB — RESP PANEL BY RT-PCR (FLU A&B, COVID) ARPGX2
Influenza A by PCR: NEGATIVE
Influenza B by PCR: NEGATIVE
SARS Coronavirus 2 by RT PCR: NEGATIVE

## 2021-08-22 LAB — BASIC METABOLIC PANEL
Anion gap: 11 (ref 5–15)
BUN: 69 mg/dL — ABNORMAL HIGH (ref 8–23)
CO2: 25 mmol/L (ref 22–32)
Calcium: 8.5 mg/dL — ABNORMAL LOW (ref 8.9–10.3)
Chloride: 88 mmol/L — ABNORMAL LOW (ref 98–111)
Creatinine, Ser: 1.4 mg/dL — ABNORMAL HIGH (ref 0.61–1.24)
GFR, Estimated: 50 mL/min — ABNORMAL LOW (ref >60)
Glucose, Bld: 398 mg/dL — ABNORMAL HIGH (ref 70–99)
Potassium: 4.4 mmol/L (ref 3.5–5.1)
Sodium: 124 mmol/L — ABNORMAL LOW (ref 135–145)

## 2021-08-22 LAB — LACTIC ACID, PLASMA
Lactic Acid, Venous: 6.4 mmol/L (ref 0.5–1.9)
Lactic Acid, Venous: 5.1 mmol/L (ref 0.5–1.9)

## 2021-08-22 LAB — TROPONIN I (HIGH SENSITIVITY)
Troponin I (High Sensitivity): 204 ng/L (ref <18)
Troponin I (High Sensitivity): 177 ng/L (ref <18)

## 2021-08-22 LAB — GLUCOSE, CAPILLARY: Glucose-Capillary: 338 mg/dL — ABNORMAL HIGH (ref 70–99)

## 2021-08-22 LAB — MAGNESIUM
Magnesium: 2.8 mg/dL — ABNORMAL HIGH (ref 1.7–2.4)
Magnesium: 2.4 mg/dL (ref 1.7–2.4)

## 2021-08-22 LAB — PHOSPHORUS: Phosphorus: 4.8 mg/dL — ABNORMAL HIGH (ref 2.5–4.6)

## 2021-08-22 LAB — OSMOLALITY, URINE: Osmolality, Ur: 380 mOsm/kg (ref 300–900)

## 2021-08-22 LAB — BRAIN NATRIURETIC PEPTIDE: B Natriuretic Peptide: 97.9 pg/mL (ref 0.0–100.0)

## 2021-08-22 LAB — PROCALCITONIN: Procalcitonin: 6.87 ng/mL

## 2021-08-22 LAB — SODIUM, URINE, RANDOM: Sodium, Ur: <10 mmol/L

## 2021-08-22 MED ORDER — DONEPEZIL HCL 5 MG PO TABS: 10.0000 mg | ORAL_TABLET | Freq: Every day | ORAL | Status: DC

## 2021-08-22 MED ORDER — SODIUM CHLORIDE 0.9 % IV SOLN
100.0000 mg | Freq: Two times a day (BID) | INTRAVENOUS | Status: AC
  Administered 2021-08-23 – 2021-08-27 (×10): 100 mg via INTRAVENOUS
  Filled 2021-08-22 (×12): qty 100

## 2021-08-22 MED ORDER — INSULIN ASPART 100 UNIT/ML IJ SOLN
5.0000 [IU] | Freq: Once | INTRAMUSCULAR | Status: AC
  Administered 2021-08-22: 5 [IU] via SUBCUTANEOUS
  Filled 2021-08-22: qty 1

## 2021-08-22 MED ORDER — IPRATROPIUM-ALBUTEROL 0.5-2.5 (3) MG/3ML IN SOLN
3.0000 mL | RESPIRATORY_TRACT | Status: DC | PRN
Start: 2021-08-22 — End: 2021-09-01
  Administered 2021-08-30: 3 mL via RESPIRATORY_TRACT
  Filled 2021-08-22: qty 3

## 2021-08-22 MED ORDER — METHYLPREDNISOLONE SODIUM SUCC 40 MG IJ SOLR
40.0000 mg | Freq: Once | INTRAMUSCULAR | Status: AC
Start: 2021-08-22 — End: 2021-08-22
  Administered 2021-08-22: 40 mg via INTRAVENOUS
  Filled 2021-08-22: qty 1

## 2021-08-22 MED ORDER — LACTATED RINGERS IV SOLN
INTRAVENOUS | Status: DC
Start: 2021-08-22 — End: 2021-08-22

## 2021-08-22 MED ORDER — DOCUSATE SODIUM 100 MG PO CAPS: 100.0000 mg | ORAL_CAPSULE | Freq: Two times a day (BID) | ORAL | Status: DC | PRN

## 2021-08-22 MED ORDER — SODIUM CHLORIDE 0.9 % IV SOLN
2.0000 g | INTRAVENOUS | Status: AC
Start: 2021-08-22 — End: 2021-08-22
  Administered 2021-08-22: 2 g via INTRAVENOUS
  Filled 2021-08-22: qty 12.5

## 2021-08-22 MED ORDER — IPRATROPIUM-ALBUTEROL 0.5-2.5 (3) MG/3ML IN SOLN
3.0000 mL | Freq: Once | RESPIRATORY_TRACT | Status: AC
  Administered 2021-08-22: 3 mL via RESPIRATORY_TRACT
  Filled 2021-08-22: qty 3

## 2021-08-22 MED ORDER — FUROSEMIDE 10 MG/ML IJ SOLN
60.0000 mg | Freq: Once | INTRAMUSCULAR | Status: AC
  Administered 2021-08-22: 60 mg via INTRAVENOUS
  Filled 2021-08-22: qty 6

## 2021-08-22 MED ORDER — POLYETHYLENE GLYCOL 3350 17 G PO PACK: 17.0000 g | PACK | Freq: Every day | ORAL | Status: DC | PRN

## 2021-08-22 MED ORDER — ENOXAPARIN SODIUM 40 MG/0.4ML IJ SOSY: 40.0000 mg | PREFILLED_SYRINGE | INTRAMUSCULAR | Status: DC

## 2021-08-22 MED ORDER — LACTATED RINGERS IV BOLUS
1000.0000 mL | Freq: Once | INTRAVENOUS | Status: AC
  Administered 2021-08-22: 1000 mL via INTRAVENOUS

## 2021-08-22 MED ORDER — VANCOMYCIN HCL 1750 MG/350ML IV SOLN
1750.0000 mg | Freq: Once | INTRAVENOUS | Status: AC
  Administered 2021-08-22: 1750 mg via INTRAVENOUS
  Filled 2021-08-22: qty 350

## 2021-08-22 MED ORDER — MEMANTINE HCL 5 MG PO TABS: 10.0000 mg | ORAL_TABLET | Freq: Every day | ORAL | Status: DC

## 2021-08-22 MED ORDER — METHYLPREDNISOLONE SODIUM SUCC 125 MG IJ SOLR: 80.0000 mg | INTRAMUSCULAR | Status: DC

## 2021-08-22 MED ORDER — INSULIN ASPART 100 UNIT/ML IJ SOLN
0.0000 [IU] | INTRAMUSCULAR | Status: DC
  Administered 2021-08-22: 15 [IU] via SUBCUTANEOUS
  Administered 2021-08-23: 3 [IU] via SUBCUTANEOUS
  Administered 2021-08-23 (×2): 4 [IU] via SUBCUTANEOUS
  Administered 2021-08-23: 3 [IU] via SUBCUTANEOUS
  Administered 2021-08-23: 4 [IU] via SUBCUTANEOUS
  Administered 2021-08-24 (×2): 3 [IU] via SUBCUTANEOUS
  Administered 2021-08-24 (×2): 4 [IU] via SUBCUTANEOUS
  Administered 2021-08-25: 3 [IU] via SUBCUTANEOUS
  Administered 2021-08-26 (×3): 4 [IU] via SUBCUTANEOUS
  Administered 2021-08-26: 3 [IU] via SUBCUTANEOUS
  Administered 2021-08-27 (×3): 4 [IU] via SUBCUTANEOUS
  Administered 2021-08-28: 7 [IU] via SUBCUTANEOUS
  Administered 2021-08-28 (×2): 4 [IU] via SUBCUTANEOUS
  Administered 2021-08-28: 7 [IU] via SUBCUTANEOUS
  Administered 2021-08-28 (×3): 4 [IU] via SUBCUTANEOUS
  Administered 2021-08-29: 7 [IU] via SUBCUTANEOUS
  Administered 2021-08-29: 3 [IU] via SUBCUTANEOUS
  Administered 2021-08-29: 7 [IU] via SUBCUTANEOUS
  Administered 2021-08-29: 11 [IU] via SUBCUTANEOUS
  Administered 2021-08-29: 4 [IU] via SUBCUTANEOUS
  Administered 2021-08-30: 3 [IU] via SUBCUTANEOUS
  Administered 2021-08-30 (×2): 7 [IU] via SUBCUTANEOUS
  Administered 2021-08-31: 3 [IU] via SUBCUTANEOUS
  Filled 2021-08-22 (×33): qty 1

## 2021-08-22 MED ORDER — SODIUM CHLORIDE 0.9 % IV SOLN
1.0000 g | INTRAVENOUS | Status: DC
  Administered 2021-08-23 – 2021-08-25 (×3): 1 g via INTRAVENOUS
  Filled 2021-08-22 (×3): qty 10

## 2021-08-22 MED ORDER — SIMVASTATIN 20 MG PO TABS
20.0000 mg | ORAL_TABLET | Freq: Every evening | ORAL | Status: DC
Start: 2021-08-23 — End: 2021-08-23

## 2021-08-22 NOTE — Progress Notes (Signed)
CODE SEPSIS - PHARMACY COMMUNICATION  **Broad Spectrum Antibiotics should be administered within 1 hour of Sepsis diagnosis**  Time Code Sepsis Called/Page Received: 3009  Antibiotics Ordered: Cefepime & Vancomycin  Time of 1st antibiotic administration: 1954  Otelia Sergeant, PharmD, [facility] 08/22/2021 7:16 PM

## 2021-08-22 NOTE — Progress Notes (Signed)
PHARMACY -  BRIEF ANTIBIOTIC NOTE   Pharmacy has received consult(s) for Cefepime & Vancomycin from an ED provider.  The patient's profile has been reviewed for ht/wt/allergies/indication/available labs.    One time order(s) placed for Cefepime 2 gm & Vancomycin 1750 mg per pt wt: 72.6 kg.  Further antibiotics/pharmacy consults should be ordered by admitting physician if indicated.                       Thank you, Otelia Sergeant, PharmD, [facility] 08/22/2021 7:16 PM

## 2021-08-22 NOTE — Sepsis Progress Note (Signed)
Elink following Code Sepsis. 

## 2021-08-22 NOTE — Progress Notes (Signed)
eLink Physician-Brief Progress Note Patient Name: Andrew [name] DOB: [birth year] MRN: [id number]   Date of Service  08/22/2021  HPI/Events of Note  Patient admitted with acute hypoxemic respiratory failure, septic shock secondary to multifocal pneumonia, and altered  mental status, he is currently on BIPAP.  eICU Interventions  New Patient Evaluation.        Thomasene Lot Nazariah Cadet 08/22/2021, 11:54 PM

## 2021-08-22 NOTE — H&P (Cosign Needed)
NAME:  Andrew [name], MRN:  [id number], DOB:  02-Feb-1936, LOS: 0 ADMISSION DATE:  08/22/2021, CONSULTATION DATE:  08/22/21 REFERRING MD:  Dr. Fanny Bien, CHIEF COMPLAINT:  Shortness of Breath   History of Present Illness:  86 yo M presenting to [facility] ED from home via EMS with complaints of dyspnea, hypoxia & fatigue. History provided by the patient, and the patient's wife and daughter who are bedside. The patient was in his normal state of health until Thursday 08/21/21 when the patient developed lower abdominal pain and the "shakes". He was diagnosed with a UTI and started on Keflex. Then on 08/22/21 he took a nap and was awoken by his wife around 4:30 pm to take some medicine. At this time he woke up and was dyspneic, unable to complete a sentence, as well as severely fatigued unable to stand. EMS was called and upon their arrival his SpO2 was in the 50's, he was placed on CPAP improving to 85-89% and received 125 mg of Solumedrol, 2 g of Mg & 1 duo neb en route. Of note the patient also had an unwitnessed fall on 7/20, he has been taking all medication as prescribed including his Warfarin.  ED course: Upon arrival the patient was placed on the BIPAP for oxygen support, Sepsis protocol initiated. Due to respiratory status and concerns for fluid volume overload, only 1 L of LR bolus with improvement in color and perfusion per ED report. Lab work revealed severe lactic acidosis with mild leukocytosis and an elevated PCT, hyperglycemia, hyponatremia with pseudohyponatremia component, thrombocytopenia- though only slightly lower than baseline, AKI, elevated troponin- suspect from demand ischemia & respiratory acidosis Medications given: Cefepime & vancomycin, Duo neb, 1 L of LR Initial Vitals: 97.5, 18, 60, 105/49 & SpO2 93% on BIPAP Significant labs: (Labs/ Imaging personally reviewed) I, Cheryll Cockayne Rust-Chester, AGACNP-BC, personally viewed and interpreted this ECG. EKG Interpretation: Date: 08/22/21, EKG  Time: 18:18, Rate: 60, Rhythm: A-fib with permanent pacemaker in place, QRS Axis: extreme axis, Intervals: prolonged Qtc in the setting of paced rhythm, ST/T Wave abnormalities: none, Narrative Interpretation: Atrial Fibrillation with permanent pacemaker Chemistry: Na+: 124, K+: 4.4, BUN/Cr.: 69/ 1.40, Serum CO2/ AG: 25/ 11, Glucose: 398 Hematology: WBC: 11.3, Hgb: 10.5, plt: 140  Troponin: 204 > 177, BNP: 97.9, Lactic/ PCT: 6.4 > 5.1/ 6.87, COVID-19 & Influenza A/B: negative VBG: 7.16/ 66/ 42/ 23.5 >> ABG: 7.25/ 64/ 92/ 23.7  CXR 08/22/21: Bilateral multifocal airspace opacities concerning for multifocal pneumonia vs pulmonary edema CT head wo contrast 08/22/21: pending CT abdomen/pelvis wo contrast 08/22/21: pending  PCCM consulted for admission due to acute hypoxic respiratory failure at HIGH RISK for intubation due to increased work of breathing.  Pertinent  Medical History  T2DM PAF on Coumadin HFpEF HLD HTN  Significant Hospital Events: Including procedures, antibiotic start and stop dates in addition to other pertinent events   08/22/21: Admit to ICU with acute hypoxic respiratory failure on BIPAP with HIGH RISK for INTUBATION due to increased WOB.  Interim History / Subjective:  Patient on 40% FiO2 continuous BIPAP, vitals stable, A&O x 3 (which appears to be baseline due to early stages of dementia) When asked he states his breathing "is fine", but he is unable to speak in complete sentences without having to catch his breath multiple times. He is sitting back, resting but moderately dyspneic with any exertion. Discussed CODE status with the patient, wife and daughter bedside explaining my concern that due to is persistent dyspnea he is at  a HIGH RISK for INTUBATION. They all confirmed FULL CODE status.  - will give Lasix dose, solu medrol - STAT CT imaging, bladder scan and foley placement - will re-evaluate respiratory status after these interventions. Patient and family agree  with plan of care, all questions and concerns answered at this time.  Objective   Blood pressure (!) 116/55, pulse (!) 59, temperature (!) 97.5 F (36.4 C), temperature source Oral, resp. rate (!) 21, height 5\' 10"  (1.778 m), weight 72.6 kg, SpO2 100 %.        Intake/Output Summary (Last 24 hours) at 08/22/2021 2131 Last data filed at 08/22/2021 2032 Gross per 24 hour  Intake 100 ml  Output --  Net 100 ml   Filed Weights   08/22/21 1832  Weight: 72.6 kg    Examination: General: Adult male, critically ill, lying in bed, moderately dyspneic, NAD HEENT: MM pink/moist, anicteric, atraumatic, neck supple Neuro: A&O x 3, able to follow commands, PERRL +3, MAE CV: s1s2 RRR, controlled A-fib with pacemaker on monitor, no r/m/g Pulm: Regular, moderately labored & dyspneic on BIPAP @ 40% fio2, breath sounds diminished -BUL & diminished with expiratory wheezing-BLL GI: soft, distended, slightly tender lower abdomen, bs x 4 Skin: scattered ecchymosis, including LUE Extremities: warm/dry, pulses + 2 R/P, +1 edema noted BLE  Resolved Hospital Problem list     Assessment & Plan:  Acute Hypoxic/ Hypercapnic Respiratory Failure secondary to CAP in the setting of suspected pulmonary edema - Continue BIPAP overnight, wean FiO2 as tolerated - Supplemental O2 to maintain SpO2 > 90% - Intermittent chest x-ray & ABG PRN - Ensure adequate pulmonary hygiene  - F/u cultures, trend PCT - Continue CAP: ceftriaxone & doxycycline - will give one additional dose of solu-medrol - bronchodilators PRN  Suspected Severe Sepsis without septic shock due to suspected CAP/ UTI Patient diagnosed with UTI on 7/20 and started on Keflex Lactic: 6.4 > 5.1, Baseline PCT: 6.87, UA: pending, CXR: concerning for multifocal pneumonia with suspected pulmonary edema, CT abdomen/pelvis: pending  Initial interventions/workup included: 1 L of NS/LR1 & Cefepime & Vancomycin - Supplemental oxygen as needed, to maintain SpO2 >  90% - f/u cultures, trend lactic/ PCT - Daily CBC, monitor WBC/ fever curve - IV antibiotics: ceftriaxone (CAP) & doxycycline - conservative IV hydration due to respiratory status - Consider vasopressors to maintain MAP< 65, norepinephrine PRN  Acute on Chronic HFrEF exacerbation Elevated Troponin secondary to demand ischemia Chronic Atrial Fibrillation PMHx: HFpEF, permanent pacemaker, HTN, HLD Troponins: 204 > 177 BNP: 97.9 ECHO 03/2018: LVEF 50-55%, mild to moderately dilated LV, RV moderately enlarged with mildly reduced systolic function. RV systolic pressure mild-moderately elevated (est. Pressure 44.8 mmHg). LA moderately dilated & RA mildly dilated - Echocardiogram ordered - Continuous cardiac monitoring  - Daily weights to assess volume status - One-time dose of 60 mg of Lasix ordered, re-assess daily and continue 80 mg of Lasix IV as renal function & hemodynamics allow - continue outpatient Warfarin per pharmacy consultation, once CT head completed  - continue simvastatin, hold anti-hypertensive medications at this time due to marginal BP. Consider restarting as patient stabilizes  Acute Kidney Injury in the setting of suspected sepsis Baseline Cr: 1.16, Cr on admission:1.40 - Strict I/O's: alert provider if UOP < 0.5 mL/kg/hr - gentle IVF hydration  - Daily BMP, replace electrolytes PRN - Avoid nephrotoxic agents as able, ensure adequate renal perfusion - Obtain urine lytes  Acute Hyponatremia secondary to unknown etiology in the setting of slight pseudohyponatremia  due to hyperglycemia Patient appears fluid volume overloaded clinically with dyspnea, +1 pitting edema BLE and abdominal distention. However BNP WNL and family reports 20 lb weight loss over the last few months. He is also on daily lasix 80 mg. Discussed case with Dr. Gillermina Phy who agrees with the administration of Lasix due to respiratory status at this time. Plan of care below. Baseline Na+: 140 (05/2021), Na+  on admission: 124- correct Na+ with glucose of 398 is 129, Urine osmolality: pending, Serum osmolality: pending Ur Na+: pending, Ur Cr: pending - recheck Na+ 2 hours after Lasix administration, consider Q 6 sodium checks - consider 3% NS 50 mL bolus if Na+ drops below 120   Type 2 Diabetes Mellitus Steroid Induced Hyperglycemia Hemoglobin A1C: pending - Monitor CBG Q 4 hours - SSI resistant dosing - target range while in ICU: 140-180 - follow ICU hyper/hypo-glycemia protocol  Dementia - early stages - supportive care - continue outpatient medication regimen  Best Practice (right click and "Reselect all SmartList Selections" daily)  Diet/type: NPO w/ oral meds as respiratory status allows DVT prophylaxis: DOAC GI prophylaxis: PPI Lines: N/A Foley:  Yes, and it is still needed Code Status:  full code Last date of multidisciplinary goals of care discussion [08/22/21]  Labs   CBC: Recent Labs  Lab 08/22/21 1821  WBC 11.3*  NEUTROABS 9.9*  HGB 10.5*  HCT 32.3*  MCV 96.4  PLT 140*    Basic Metabolic Panel: Recent Labs  Lab 08/22/21 1821  NA 124*  K 4.4  CL 88*  CO2 25  GLUCOSE 398*  BUN 69*  CREATININE 1.40*  CALCIUM 8.5*  MG 2.8*   GFR: Estimated Creatinine Clearance: 40.3 mL/min (A) (by C-G formula based on SCr of 1.4 mg/dL (H)). Recent Labs  Lab 08/22/21 1821 08/22/21 1822 08/22/21 1835  PROCALCITON  --   --  6.87  WBC 11.3*  --   --   LATICACIDVEN  --  6.4*  --     Liver Function Tests: No results for input(s): "AST", "ALT", "ALKPHOS", "BILITOT", "PROT", "ALBUMIN" in the last 168 hours. No results for input(s): "LIPASE", "AMYLASE" in the last 168 hours. No results for input(s): "AMMONIA" in the last 168 hours.  ABG    Component Value Date/Time   HCO3 23.5 08/22/2021 1822   ACIDBASEDEF 5.7 (H) 08/22/2021 1822   O2SAT 57.3 08/22/2021 1822     Coagulation Profile: No results for input(s): "INR", "PROTIME" in the last 168 hours.  Cardiac  Enzymes: No results for input(s): "CKTOTAL", "CKMB", "CKMBINDEX", "TROPONINI" in the last 168 hours.  HbA1C: Hgb A1c MFr Bld  Date/Time Value Ref Range Status  05/19/2021 09:23 PM 8.0 (H) 4.8 - 5.6 % Final    Comment:    (NOTE) Pre diabetes:          5.7%-6.4%  Diabetes:              >6.4%  Glycemic control for   <7.0% adults with diabetes     CBG: No results for input(s): "GLUCAP" in the last 168 hours.  Review of Systems: positives in BOLD  Gen: Denies fever, chills, weight change, fatigue, night sweats HEENT: Denies blurred vision, double vision, hearing loss, tinnitus, sinus congestion, rhinorrhea, sore throat, neck stiffness, dysphagia PULM: Denies shortness of breath, cough, sputum production, hemoptysis, wheezing CV: Denies chest pain, edema, orthopnea, paroxysmal nocturnal dyspnea, palpitations GI: Denies abdominal pain, nausea, vomiting, diarrhea, hematochezia, melena, constipation, change in bowel habits GU: Denies dysuria, hematuria,  polyuria, oliguria, urethral discharge Endocrine: Denies hot or cold intolerance, polyuria, polyphagia or appetite change Derm: Denies rash, dry skin, scaling or peeling skin change Heme: Denies easy bruising, bleeding, bleeding gums Neuro: Denies headache, numbness, weakness, slurred speech, loss of memory or consciousness  Past Medical History:  He,  has a past medical history of CHF (congestive heart failure) (HCC), Diabetes mellitus without complication (HCC), Essential hypertension, Mixed hyperlipidemia, and Permanent atrial fibrillation (HCC).   Surgical History:   Past Surgical History:  Procedure Laterality Date   APPENDECTOMY     INSERT / REPLACE / REMOVE PACEMAKER     PACEMAKER IMPLANT N/A 03/25/2018   Procedure: PACEMAKER IMPLANT;  Surgeon: Hillis Range, MD;  Location: [facility] INVASIVE CV LAB;  Service: Cardiovascular;  Laterality: N/A;     Social History:   reports that he has quit smoking. His smoking use included  cigarettes. He has a 52.50 pack-year smoking history. He has never used smokeless tobacco. He reports that he does not currently use alcohol. He reports that he does not use drugs.   Family History:  His family history includes Bladder Cancer in his father; CVA in his mother.   Allergies Allergies  Allergen Reactions   Methyldopa Nausea Only and Palpitations    Other reaction(s): Unknown Other reaction(s): NAUSEA 'palpitation" Other reaction(s): NAUSEA Other reaction(s): NAUSEA      Home Medications  Prior to Admission medications   Medication Sig Start Date End Date Taking? Authorizing Provider  amLODipine (NORVASC) 2.5 MG tablet Take 2.5 mg by mouth daily. 05/02/21   [provider]  donepezil (ARICEPT) 10 MG tablet Take 10 mg by mouth at bedtime. 03/14/21   [provider]  doxazosin (CARDURA) 4 MG tablet Take 4 mg by mouth Nightly. 12/31/16   [provider]  furosemide (LASIX) 80 MG tablet Take 80 mg by mouth daily.    [provider]  glimepiride (AMARYL) 2 MG tablet Take 4 mg by mouth daily. 08/14/16   [provider]  guaiFENesin-dextromethorphan (ROBITUSSIN DM) 100-10 MG/5ML syrup Take 5 mLs by mouth every 4 (four) hours as needed for cough. 05/21/21   Hollice Espy, MD  lisinopril (ZESTRIL) 20 MG tablet Take 20 mg by mouth daily. 05/12/21   [provider]  memantine (NAMENDA) 10 MG tablet Take 10 mg by mouth daily. 05/18/21   [provider]  metFORMIN (GLUCOPHAGE) 1000 MG tablet Take 1,000 mg by mouth 2 (two) times daily.    [provider]  pioglitazone (ACTOS) 15 MG tablet Take 15 mg by mouth daily.     [provider]  Potassium 99 MG TABS Take 1.5 tablets by mouth daily.    [provider]  simvastatin (ZOCOR) 20 MG tablet Take 20 mg by mouth Nightly. 12/31/16   [provider]  vitamin B-12 (CYANOCOBALAMIN) 1000 MCG tablet Take 1,000 mcg by mouth daily.    [provider]  warfarin (COUMADIN) 2.5 MG tablet Take 1.25-2.5 mg by mouth as directed. Take 1/2 tablet (1.25 mg) on Monday, Wednesday and Friday. Take 1 tablet (2.5 mg) on Sunday, Tuesday, Thursday and Saturday. 01/17/21   [provider]     Critical care time: 65 minutes       Betsey Holiday, AGACNP-BC Acute Care Nurse Practitioner [facility]   [phone] / [phone] Please see Amion for pager details.

## 2021-08-22 NOTE — ED Provider Notes (Signed)
[facility] Provider Note    Event Date/Time   First MD Initiated Contact with Patient 08/22/21 1829     (approximate)  History   Respiratory Distress (Patient from home, dispatch called for SOB; Upon arrival, patient was 50% on RA and was immediately placed on CPAP (85% - 89% on CPAP); Received 1 Duoneb, Solu-Medrol 125 mg IV, and Mg 2 g IV en route)  EM caveat: Patient with respiratory distress, currently on CPAP.  Unable to provide a lengthy history  HPI  Andrew [name] is a 86 y.o. male who on review of his records from April 19 has a history of severe sepsis acute pulmonary failure community-acquired pneumonia acute kidney injury atrial fibrillation congestive heart failure hypertension diabetes  He is able to report over CPAP and later BiPAP that he is not in any pain.  He is experiencing shortness of breath.     Physical Exam   Triage Vital Signs: ED Triage Vitals  Enc Vitals Group     BP 08/22/21 1820 (!) 101/46     Pulse Rate 08/22/21 1820 60     Resp 08/22/21 1820 17     Temp 08/22/21 1830 (!) 97.5 F (36.4 C)     Temp Source 08/22/21 1830 Oral     SpO2 08/22/21 1820 90 %     Weight --      Height --      Head Circumference --      Peak Flow --      Pain Score --      Pain Loc --      Pain Edu? --      Excl. in [place]? --     Most recent vital signs: Vitals:   08/22/21 2015 08/22/21 2030  BP: 102/85 (!) 116/55  Pulse: 60 (!) 59  Resp: (!) 22 (!) 21  Temp:    SpO2: 100% 100%     General: Awake, breathing on CPAP tachypneic, oxygen saturation approximately 85% but quickly improved when placed on BiPAP to normal oxygen saturation CV:  Good peripheral perfusion.  Normal heart rate. Resp:  Moderate accessory muscle use, no weakening noted, able to speak 1-2 words over BiPAP mask, and crackles noted in the lower lobes bilaterally Abd:  No distention.  Other:  No lower extremity edema to noted.  Venous stasis changes of the  skin.   ED Results / Procedures / Treatments   Labs (all labs ordered are listed, but only abnormal results are displayed) Labs Reviewed  MAGNESIUM - Abnormal; Notable for the following components:      Result Value   Magnesium 2.8 (*)    All other components within normal limits  BASIC METABOLIC PANEL - Abnormal; Notable for the following components:   Sodium 124 (*)    Chloride 88 (*)    Glucose, Bld 398 (*)    BUN 69 (*)    Creatinine, Ser 1.40 (*)    Calcium 8.5 (*)    GFR, Estimated 50 (*)    All other components within normal limits  LACTIC ACID, PLASMA - Abnormal; Notable for the following components:   Lactic Acid, Venous 6.4 (*)    All other components within normal limits  BLOOD GAS, VENOUS - Abnormal; Notable for the following components:   pH, Ven 7.16 (*)    pCO2, Ven 66 (*)    Acid-base deficit 5.7 (*)    All other components within normal limits  CBC WITH DIFFERENTIAL/PLATELET -  Abnormal; Notable for the following components:   WBC 11.3 (*)    RBC 3.35 (*)    Hemoglobin 10.5 (*)    HCT 32.3 (*)    Platelets 140 (*)    Neutro Abs 9.9 (*)    All other components within normal limits  TROPONIN I (HIGH SENSITIVITY) - Abnormal; Notable for the following components:   Troponin I (High Sensitivity) 204 (*)    All other components within normal limits  RESP PANEL BY RT-PCR (FLU A&B, COVID) ARPGX2  CULTURE, BLOOD (ROUTINE X 2)  CULTURE, BLOOD (ROUTINE X 2)  BRAIN NATRIURETIC PEPTIDE  PROCALCITONIN  URINALYSIS, ROUTINE W REFLEX MICROSCOPIC  BLOOD GAS, ARTERIAL  TROPONIN I (HIGH SENSITIVITY)     EKG  Interpreted by me at 1820 heart rate 60 QRS 170 QTc 500 Probable atrial fibrillation, left bundle branch block, also noted I reviewed the patient's EMS EKG which appeared to have left bundle branch block with pacer spikes or at least evidence of pacing, I see occasional area that may represent ventricular pacing but not seen  throughout   RADIOLOGY  Personally interpreted the patient's chest x-ray is concerning for multifocal infiltrates     PROCEDURES:  Critical Care performed: Yes, see critical care procedure note(s)  CRITICAL CARE Performed by: Sharyn Creamer   Total critical care time: 35 minutes  Critical care time was exclusive of separately billable procedures and treating other patients.  Critical care was necessary to treat or prevent imminent or life-threatening deterioration.  Critical care was time spent personally by me on the following activities: development of treatment plan with patient and/or surrogate as well as nursing, discussions with consultants, evaluation of patient's response to treatment, examination of patient, obtaining history from patient or surrogate, ordering and performing treatments and interventions, ordering and review of laboratory studies, ordering and review of radiographic studies, pulse oximetry and re-evaluation of patient's condition.   Procedures   MEDICATIONS ORDERED IN ED: Medications  lactated ringers infusion (0 mLs Intravenous Hold 08/22/21 1928)  vancomycin (VANCOREADY) IVPB 1750 mg/350 mL (1,750 mg Intravenous New Bag/Given 08/22/21 2054)  ipratropium-albuterol (DUONEB) 0.5-2.5 (3) MG/3ML nebulizer solution 3 mL (has no administration in time range)  lactated ringers bolus 1,000 mL (1,000 mLs Intravenous New Bag/Given 08/22/21 1928)  ceFEPIme (MAXIPIME) 2 g in sodium chloride 0.9 % 100 mL IVPB (0 g Intravenous Stopped 08/22/21 2024)     IMPRESSION / MDM / ASSESSMENT AND PLAN / ED COURSE  I reviewed the triage vital signs and the nursing notes.                              Differential diagnosis includes, but is not limited to, sepsis, congestive heart failure though the patient does not appear obviously volume overloaded at this time, COPD, asthma, pneumothorax, volume overload, ACS etc.  Patient's presentation is most consistent with acute  presentation with potential threat to life or bodily function.  The patient is on the cardiac monitor to evaluate for evidence of arrhythmia and/or significant heart rate changes.  ----------------------------------------- 8:37 PM on 08/22/2021 ----------------------------------------- Vitals:   08/22/21 2015 08/22/21 2030  BP: 102/85 (!) 116/55  Pulse: 60 (!) 59  Resp: (!) 22 (!) 21  Temp:    SpO2: 100% 100%    Patient's blood pressures have improved.  He appears warm well perfused.  Normotensive just slightly hypotensive at this time last blood pressure 116/52.  He is improving with fluid  resuscitation, currently completing 1 L.  Due to the patient's history of congestive heart failure, his presentation today and having discussed with patient and family we will be somewhat cautious in use of fluid bolusing and fluid resuscitation.  Thus far he is responding well to fluid administration in the first liter of fluid.  Patient sepsis reassessment.  Code sepsis has been activated now for some time, and antibiotics with broad-spectrum initiated.  Patient's daughter and wife at the bedside both updated, patient and family are agreeable and understand plan for admission.  Continues to tolerate BiPAP well.  Blood pressure is improved  And a lactic acid suggestive of severe sepsis lactic acid suggest severe sepsis.  Hyponatremia, slight element of pseudohyponatremia.  We will continue fluid resuscitation at this time as patient with evidence of severe sepsis  Labs notable for mild anemia elevated white count notably elevated procalcitonin Clinical Course as of 08/22/21 2111  Fri Aug 22, 2021  2100 Critical care team will come to see and evaluate for possible ICU team admission versus admission to hospitalist with CCM availability. D/W Rust-Chester, NP [MQ]    Clinical Course User Index [MQ] Sharyn Creamer, MD   ----------------------------------------- 10:05 PM on  08/22/2021 ----------------------------------------- Patient admitted to critical care medicine team attending Dr. Catha Gosselin  FINAL CLINICAL IMPRESSION(S) / ED DIAGNOSES   Final diagnoses:  Community acquired pneumonia, unspecified laterality  Severe sepsis (HCC)     Rx / DC Orders   ED Discharge Orders     None        Note:  This document was prepared using Dragon voice recognition software and may include unintentional dictation errors.   Sharyn Creamer, MD 08/22/21 2206

## 2021-08-23 ENCOUNTER — Inpatient Hospital Stay: Payer: Medicare HMO

## 2021-08-23 DIAGNOSIS — R579 Shock, unspecified: Secondary | ICD-10-CM

## 2021-08-23 DIAGNOSIS — J9602 Acute respiratory failure with hypercapnia: Secondary | ICD-10-CM | POA: Diagnosis not present

## 2021-08-23 DIAGNOSIS — A419 Sepsis, unspecified organism: Secondary | ICD-10-CM

## 2021-08-23 DIAGNOSIS — J9601 Acute respiratory failure with hypoxia: Secondary | ICD-10-CM | POA: Diagnosis not present

## 2021-08-23 DIAGNOSIS — J189 Pneumonia, unspecified organism: Secondary | ICD-10-CM | POA: Diagnosis not present

## 2021-08-23 DIAGNOSIS — K56609 Unspecified intestinal obstruction, unspecified as to partial versus complete obstruction: Secondary | ICD-10-CM | POA: Diagnosis not present

## 2021-08-23 DIAGNOSIS — R6520 Severe sepsis without septic shock: Secondary | ICD-10-CM

## 2021-08-23 DIAGNOSIS — N179 Acute kidney failure, unspecified: Secondary | ICD-10-CM | POA: Diagnosis not present

## 2021-08-23 LAB — CBC
HCT: 27.4 % — ABNORMAL LOW (ref 39.0–52.0)
Hemoglobin: 9 g/dL — ABNORMAL LOW (ref 13.0–17.0)
MCH: 30.5 pg (ref 26.0–34.0)
MCHC: 32.8 g/dL (ref 30.0–36.0)
MCV: 92.9 fL (ref 80.0–100.0)
Platelets: 136 10*3/uL — ABNORMAL LOW (ref 150–400)
RBC: 2.95 MIL/uL — ABNORMAL LOW (ref 4.22–5.81)
RDW: 14.5 % (ref 11.5–15.5)
WBC: 8.8 10*3/uL (ref 4.0–10.5)
nRBC: 0 % (ref 0.0–0.2)

## 2021-08-23 LAB — MAGNESIUM: Magnesium: 2.2 mg/dL (ref 1.7–2.4)

## 2021-08-23 LAB — GLUCOSE, CAPILLARY
Glucose-Capillary: 195 mg/dL — ABNORMAL HIGH (ref 70–99)
Glucose-Capillary: 160 mg/dL — ABNORMAL HIGH (ref 70–99)
Glucose-Capillary: 117 mg/dL — ABNORMAL HIGH (ref 70–99)
Glucose-Capillary: 163 mg/dL — ABNORMAL HIGH (ref 70–99)
Glucose-Capillary: 143 mg/dL — ABNORMAL HIGH (ref 70–99)
Glucose-Capillary: 149 mg/dL — ABNORMAL HIGH (ref 70–99)

## 2021-08-23 LAB — BLOOD GAS, ARTERIAL
Acid-base deficit: 0.9 mmol/L (ref 0.0–2.0)
Bicarbonate: 25.3 mmol/L (ref 20.0–28.0)
FIO2: 100 %
MECHVT: 500 mL
O2 Saturation: 99.9 %
PEEP: 10 cmH2O
Patient temperature: 37
RATE: 18 resp/min
pCO2 arterial: 48 mmHg (ref 32–48)
pH, Arterial: 7.33 — ABNORMAL LOW (ref 7.35–7.45)
pO2, Arterial: 299 mmHg — ABNORMAL HIGH (ref 83–108)

## 2021-08-23 LAB — PHOSPHORUS: Phosphorus: 4.5 mg/dL (ref 2.5–4.6)

## 2021-08-23 LAB — SODIUM
Sodium: 126 mmol/L — ABNORMAL LOW (ref 135–145)
Sodium: 127 mmol/L — ABNORMAL LOW (ref 135–145)
Sodium: 127 mmol/L — ABNORMAL LOW (ref 135–145)

## 2021-08-23 LAB — PROTIME-INR
INR: 5.3 (ref 0.8–1.2)
Prothrombin Time: 48 seconds — ABNORMAL HIGH (ref 11.4–15.2)

## 2021-08-23 LAB — BASIC METABOLIC PANEL
Anion gap: 7 (ref 5–15)
BUN: 76 mg/dL — ABNORMAL HIGH (ref 8–23)
CO2: 27 mmol/L (ref 22–32)
Calcium: 8.1 mg/dL — ABNORMAL LOW (ref 8.9–10.3)
Chloride: 91 mmol/L — ABNORMAL LOW (ref 98–111)
Creatinine, Ser: 1.3 mg/dL — ABNORMAL HIGH (ref 0.61–1.24)
GFR, Estimated: 54 mL/min — ABNORMAL LOW (ref >60)
Glucose, Bld: 229 mg/dL — ABNORMAL HIGH (ref 70–99)
Potassium: 4.3 mmol/L (ref 3.5–5.1)
Sodium: 125 mmol/L — ABNORMAL LOW (ref 135–145)

## 2021-08-23 LAB — LACTIC ACID, PLASMA
Lactic Acid, Venous: 2.3 mmol/L (ref 0.5–1.9)
Lactic Acid, Venous: 1.6 mmol/L (ref 0.5–1.9)

## 2021-08-23 LAB — HEMOGLOBIN A1C
Hgb A1c MFr Bld: 7.2 % — ABNORMAL HIGH (ref 4.8–5.6)
Mean Plasma Glucose: 159.94 mg/dL

## 2021-08-23 LAB — MRSA NEXT GEN BY PCR, NASAL: MRSA by PCR Next Gen: NOT DETECTED

## 2021-08-23 LAB — PROCALCITONIN: Procalcitonin: 7.5 ng/mL

## 2021-08-23 LAB — OSMOLALITY: Osmolality: 298 mOsm/kg — ABNORMAL HIGH (ref 275–295)

## 2021-08-23 MED ORDER — ORAL CARE MOUTH RINSE: 15.0000 mL | OROMUCOSAL | Status: DC | PRN

## 2021-08-23 MED ORDER — MIDAZOLAM HCL 2 MG/2ML IJ SOLN
1.0000 mg | INTRAMUSCULAR | Status: DC | PRN
  Administered 2021-08-23: 1 mg via INTRAVENOUS
  Filled 2021-08-23: qty 2

## 2021-08-23 MED ORDER — ALBUMIN HUMAN 25 % IV SOLN
25.0000 g | Freq: Once | INTRAVENOUS | Status: AC
  Administered 2021-08-23: 25 g via INTRAVENOUS
  Filled 2021-08-23: qty 100

## 2021-08-23 MED ORDER — SODIUM CHLORIDE 0.9% FLUSH: 10.0000 mL | INTRAVENOUS | Status: DC | PRN

## 2021-08-23 MED ORDER — PANTOPRAZOLE SODIUM 40 MG IV SOLR
40.0000 mg | INTRAVENOUS | Status: DC
  Administered 2021-08-23 – 2021-08-30 (×8): 40 mg via INTRAVENOUS
  Filled 2021-08-23 (×8): qty 10

## 2021-08-23 MED ORDER — MIDAZOLAM HCL 2 MG/2ML IJ SOLN
4.0000 mg | Freq: Once | INTRAMUSCULAR | Status: AC
Start: 2021-08-23 — End: 2021-08-23
  Administered 2021-08-23: 4 mg via INTRAVENOUS
  Filled 2021-08-23: qty 4

## 2021-08-23 MED ORDER — NOREPINEPHRINE 4 MG/250ML-% IV SOLN
INTRAVENOUS | Status: AC
  Administered 2021-08-23: 8 ug/min via INTRAVENOUS
  Filled 2021-08-23: qty 250

## 2021-08-23 MED ORDER — VECURONIUM BROMIDE 10 MG IV SOLR
10.0000 mg | Freq: Once | INTRAVENOUS | Status: DC
  Filled 2021-08-23: qty 10

## 2021-08-23 MED ORDER — FENTANYL CITRATE (PF) 100 MCG/2ML IJ SOLN
100.0000 ug | Freq: Once | INTRAMUSCULAR | Status: AC
  Administered 2021-08-23: 100 ug via INTRAVENOUS
  Filled 2021-08-23: qty 2

## 2021-08-23 MED ORDER — SODIUM CHLORIDE 0.9% FLUSH
10.0000 mL | Freq: Two times a day (BID) | INTRAVENOUS | Status: DC
  Administered 2021-08-24 – 2021-08-27 (×4): 10 mL
  Administered 2021-08-27: 20 mL
  Administered 2021-08-28: 10 mL
  Administered 2021-08-28: 40 mL
  Administered 2021-08-29: 30 mL

## 2021-08-23 MED ORDER — SODIUM CHLORIDE 0.9 % IV BOLUS
1000.0000 mL | Freq: Once | INTRAVENOUS | Status: AC
  Administered 2021-08-23: 1000 mL via INTRAVENOUS

## 2021-08-23 MED ORDER — CHLORHEXIDINE GLUCONATE CLOTH 2 % EX PADS
6.0000 | MEDICATED_PAD | Freq: Every day | CUTANEOUS | Status: DC
Start: 2021-08-23 — End: 2021-09-01
  Administered 2021-08-23 – 2021-08-30 (×8): 6 via TOPICAL

## 2021-08-23 MED ORDER — NOREPINEPHRINE 4 MG/250ML-% IV SOLN
0.0000 ug/min | INTRAVENOUS | Status: DC
  Administered 2021-08-23 (×2): 13 ug/min via INTRAVENOUS
  Administered 2021-08-23: 15 ug/min via INTRAVENOUS
  Administered 2021-08-23: 22 ug/min via INTRAVENOUS
  Administered 2021-08-24: 6 ug/min via INTRAVENOUS
  Filled 2021-08-23 (×4): qty 250

## 2021-08-23 MED ORDER — PROPOFOL 1000 MG/100ML IV EMUL
5.0000 ug/kg/min | INTRAVENOUS | Status: DC
  Administered 2021-08-23: 10 ug/kg/min via INTRAVENOUS
  Administered 2021-08-24 – 2021-08-26 (×7): 30 ug/kg/min via INTRAVENOUS
  Filled 2021-08-23 (×9): qty 100

## 2021-08-23 MED ORDER — FENTANYL 2500MCG IN NS 250ML (10MCG/ML) PREMIX INFUSION
25.0000 ug/h | INTRAVENOUS | Status: DC
  Administered 2021-08-23: 200 ug/h via INTRAVENOUS
  Administered 2021-08-23: 25 ug/h via INTRAVENOUS
  Administered 2021-08-24: 150 ug/h via INTRAVENOUS
  Administered 2021-08-25: 25 ug/h via INTRAVENOUS
  Filled 2021-08-23 (×4): qty 250

## 2021-08-23 MED ORDER — ETOMIDATE 2 MG/ML IV SOLN
20.0000 mg | Freq: Once | INTRAVENOUS | Status: DC
  Filled 2021-08-23: qty 10

## 2021-08-23 MED ORDER — NOREPINEPHRINE 4 MG/250ML-% IV SOLN
2.0000 ug/min | INTRAVENOUS | Status: DC
  Filled 2021-08-23: qty 250

## 2021-08-23 MED ORDER — SODIUM CHLORIDE 0.9 % IV BOLUS
500.0000 mL | Freq: Once | INTRAVENOUS | Status: AC
  Administered 2021-08-23: 500 mL via INTRAVENOUS

## 2021-08-23 MED ORDER — FENTANYL CITRATE PF 50 MCG/ML IJ SOSY: 25.0000 ug | PREFILLED_SYRINGE | Freq: Once | INTRAMUSCULAR | Status: DC

## 2021-08-23 MED ORDER — ORAL CARE MOUTH RINSE
15.0000 mL | OROMUCOSAL | Status: DC
  Administered 2021-08-23 (×4): 15 mL via OROMUCOSAL

## 2021-08-23 MED ORDER — SODIUM CHLORIDE 0.9 % IV SOLN: 250.0000 mL | INTRAVENOUS | Status: DC

## 2021-08-23 MED ORDER — FENTANYL BOLUS VIA INFUSION
25.0000 ug | INTRAVENOUS | Status: DC | PRN
  Administered 2021-08-23: 50 ug via INTRAVENOUS
  Administered 2021-08-23: 100 ug via INTRAVENOUS

## 2021-08-23 MED ORDER — VASOPRESSIN 20 UNITS/100 ML INFUSION FOR SHOCK
0.0000 [IU]/min | INTRAVENOUS | Status: DC
  Administered 2021-08-23 – 2021-08-25 (×6): 0.04 [IU]/min via INTRAVENOUS
  Filled 2021-08-23 (×7): qty 100

## 2021-08-23 MED ORDER — ORAL CARE MOUTH RINSE
15.0000 mL | OROMUCOSAL | Status: DC
  Administered 2021-08-23 – 2021-08-28 (×62): 15 mL via OROMUCOSAL

## 2021-08-23 NOTE — Procedures (Signed)
Central Venous Catheter Insertion Procedure Note  Andrew [name]  [id number]  [birth year]  Date:08/23/21  Time:2:25 AM   Provider Performing:Ac Colan L Rust-Chester   Procedure: Insertion of Non-tunneled Central Venous Catheter(36556) with US guidance (51761)   Indication(s) Medication administration  Consent Unable to obtain consent due to emergent nature of procedure.  Anesthesia Topical only with 1% lidocaine  & fentanyl drip running  Timeout Verified patient identification, verified procedure, site/side was marked, verified correct patient position, special equipment/implants available, medications/allergies/relevant history reviewed, required imaging and test results available.  Sterile Technique Maximal sterile technique including full sterile barrier drape, hand hygiene, sterile gown, sterile gloves, mask, hair covering, sterile ultrasound probe cover (if used).  Procedure Description Area of catheter insertion was cleaned with chlorhexidine and draped in sterile fashion.  With real-time ultrasound guidance a central venous catheter was placed into the left internal jugular vein. Nonpulsatile blood flow and easy flushing noted in all ports.  The catheter was sutured in place and sterile dressing applied.  Complications/Tolerance None; patient tolerated the procedure well. Chest X-ray is ordered to verify placement for internal jugular or subclavian cannulation.   Chest x-ray is not ordered for femoral cannulation.  EBL Minimal  Specimen(s) None  Patient significantly hypotensive post mechanical intubation requiring 15 mcg of levo while halfway through a 1 L bolus. CVC placed emergently.  Cheryll Cockayne Rust-Chester, AGACNP-BC Acute Care Nurse Practitioner [facility]   [phone] / [phone] Please see Amion for pager details.

## 2021-08-23 NOTE — Progress Notes (Signed)
Pharmacy Electrolyte Monitoring Consult:  Pharmacy consulted to assist in monitoring and replacing electrolytes in this 86 y.o. male admitted on 08/22/2021 with Respiratory Distress (Patient from home, dispatch called for SOB; Upon arrival, patient was 50% on RA and was immediately placed on CPAP (85% - 89% on CPAP); Received 1 Duoneb, Solu-Medrol 125 mg IV, and Mg 2 g IV en route)   Labs:  Sodium (mmol/L)  Date Value  08/22/2021 124 (L)   Potassium (mmol/L)  Date Value  08/22/2021 4.4   Magnesium (mg/dL)  Date Value  85/90/9311 2.4   Phosphorus (mg/dL)  Date Value  21/62/4469 4.8 (H)   Calcium (mg/dL)  Date Value  50/72/2575 8.5 (L)   Albumin (g/dL)  Date Value  06/19/3356 3.2 (L)    Assessment/Plan: Electrolytes WNL, no replacement at this time. F/u electrolytes with next labs  Andrew [name] A 08/23/2021 1:50 AM

## 2021-08-23 NOTE — Progress Notes (Signed)
Patient's wife Andrew [name] advised RN that she would like patient's code status to be changed to Do Not Resuscitate.  RN and patient's wife discussed what Do Not Resuscitate status means and patient's wife Andrew [name] acknowledged understanding and voiced the meaning of Do Not Resuscitate in her own words.   MD Catha Gosselin notified of patient's wife's wishes.

## 2021-08-23 NOTE — Progress Notes (Addendum)
Nursing attempted without success to place NGT. Significant bleeding from traumatic placement s/t Warfarin outpatient. Patient audibly aspirating, spoke with wife, daughter and patient recommending intubation and mechanical ventilatory support. All on board with new plan of care. While setting up for RSI, patient hypoxic in the 80's now requiring 100% BIPAP to pre-oxygenate for mechanical intubation.  Acute Hypoxic / Hypercapnic Respiratory Failure secondary to aspiration in the setting of SBO and abdominal distention - Ventilator settings: PRVC  8 mL/kg, 100% FiO2, 10 PEEP, continue ventilator support & lung protective strategies - Wean PEEP & FiO2 as tolerated, maintain SpO2 > 90% - Head of bed elevated 30 degrees, VAP protocol in place - Plateau pressures less than 30 cm H20  - Intermittent chest x-ray & ABG PRN - Daily WUA with SBT as tolerated  - Ensure adequate pulmonary hygiene  - F/u cultures, trend PCT - Continue CAP/Aspiration Pna coverage ceftriaxone & doxycycline - bronchodilators PRN - PAD protocol in place: continue Fentanyl drip & Versed IVP (as patient is hypotensive post procedure) - initiate peripheral levophed, titrate PRN to maintain MAP > 65  Addendum:  Supratherapeutic INR at 5.3 on Warfarin for Atrial Fibrillation Patient remains in controlled A-fib - will hold anticoagulation until INR < 2.0 - per 2012 American College of Chest Physicians and the 2018 American Society of Hematology [employer]) guidelines both have a suggestion not to use vitamin K in this setting but hold anticoagulation  - follow INR closely, patient did have some bleeding with NGT placement which stopped without intervention. If any significant bleeding develops will give a dose of Vitamin K    Betsey Holiday, AGACNP-BC Acute Care Nurse Practitioner  [facility]   [phone] / [phone] Please see Amion for pager details.

## 2021-08-23 NOTE — Procedures (Signed)
Intubation Procedure Note  KISHAUN EREKSON  [id number]  Jun 01, 1935  Date:08/23/21  Time:1:33 AM   Provider Performing:Madelina Sanda L Rust-Chester    Procedure: Intubation (31500)  Indication(s) Respiratory Failure  Consent Unable to obtain consent due to emergent nature of procedure.   Anesthesia Versed and Fentanyl   Time Out Verified patient identification, verified procedure, site/side was marked, verified correct patient position, special equipment/implants available, medications/allergies/relevant history reviewed, required imaging and test results available.   Sterile Technique Usual hand hygeine, masks, and gloves were used   Procedure Description Patient positioned in bed supine.  Sedation given as noted above.  Patient was intubated with endotracheal tube using Glidescope.  View was Grade 1 full glottis .  Number of attempts was 1.  Colorimetric CO2 detector was consistent with tracheal placement.   Complications/Tolerance None; patient tolerated the procedure well. Chest X-ray is ordered to verify placement.   EBL Minimal, some blood at oropharynx, but no blood noted in airway   Specimen(s) None  Betsey Holiday, AGACNP-BC Acute Care Nurse Practitioner [facility]   [phone] / [phone] Please see Amion for pager details.

## 2021-08-23 NOTE — Progress Notes (Signed)
Patients belongings (upper and lower dentures, gold colored ring, and a pair of socks) given to patient's wife Herschell Virani at bedside.  Patient's wife is going to take belongings home.

## 2021-08-23 NOTE — Progress Notes (Signed)
This nurse and Charge nurse attempted inserting Nasogastric tube for gastric decompression as pt aspirating secondary to bowel obstruction. Unable to successfully insert NGT. Patient had epistaxis secondary to trauma from NGT insertion. Patient had been taking hwarfarin prior to this admission. Decision to intubate pt because of worsening respiratory failure secondary to Small bowel obstruction and abdominal distention. Epistaxis stopped without intervention. OG tube placed c/o NP and was able to decompress the stomach.

## 2021-08-23 NOTE — Consult Note (Signed)
Patient ID: Andrew [name], male   DOB: 30-Aug-1935, 86 y.o.   MRN: [id number]  HPI Andrew [name] is a 86 y.o. male seen in consultation at the request of Ms. Irine Seal- Annia Friendly nurse practitioner. He came in the last night and was admitted early this morning to the ICU secondary to acute hypoxic respiratory failure.  He presented with dyspnea hypoxia and fatigue.  In the ER I have reviewed the nose and he denied any abdominal pain.  The history is taken from the chart and also from the wife and daughter. EMS was called due to hypoxia.  He was seen in the emergency room and was placed on BiPAP as well as resuscitation with fluid boluses.  At that time work-up revealed severe lactic acidosis with hyperglycemia severe hyponatremia thrombocytopenia and elevated INR greater than 5.  He was scan and have personally reviewed his CT.  There is evidence of dilation of the small bowel with some ascites.  I do feel that this image is more consistent with shock bowel rather than a true primary bowel obstruction.  There is no free air. Cardiomegaly, there is evidence of bilateral Pneumonia and infiltrates Chemistry: Na+: 124, K+: 4.4, BUN/Cr.: 69/ 1.40, Serum CO2/ AG: 25/ 11, Glucose: 398 Hematology: WBC: 11.3, Hgb: 10.5, plt: 140  Troponin: 204 > 177, BNP: 97.9, Lactic/ PCT: 6.4 > 5.1/ 6.87, COVID-19 & Influenza A/B: negative VBG: 7.16/ 66/ 42/ 23.5 >> ABG: 7.25/ 64/ 92/ 23.7  The pt has significat hx of dementia, CHF, DM, anticoagulated,  w pace maker.  From a functional status he lives with his wife, HE still able to walk w assistance.  Surg hx appendectomy and pacemaker. Recent hospitalization w sepsis and pneumonia as wel las AKI This am pt was intubated and started to worsened. Now on pressors  Vasopresin and levophed  This am he is multiorgan failure  HPI  Past Medical History:  Diagnosis Date   CHF (congestive heart failure) (HCC)    Diabetes mellitus without complication (HCC)    Essential  hypertension    Mixed hyperlipidemia    Permanent atrial fibrillation (HCC)    a. s/p catheter ablation ~ 12 yrs ago @ [facility] per family->unsuccessful; b. CHA2DS2VASc = 5-->chronic coumadin.    Past Surgical History:  Procedure Laterality Date   APPENDECTOMY     INSERT / REPLACE / REMOVE PACEMAKER     PACEMAKER IMPLANT N/A 03/25/2018   Procedure: PACEMAKER IMPLANT;  Surgeon: Hillis Range, MD;  Location: [facility] INVASIVE [facility];  Service: Cardiovascular;  Laterality: N/A;    Family History  Problem Relation Age of Onset   CVA Mother    Bladder Cancer Father     Social History Social History   Tobacco Use   Smoking status: Former    Packs/day: 1.50    Years: 35.00    Total pack years: 52.50    Types: Cigarettes   Smokeless tobacco: Never  Substance Use Topics   Alcohol use: Not Currently    Comment: 1.5 beers a month   Drug use: Never    Allergies  Allergen Reactions   Methyldopa Nausea Only and Palpitations    Other reaction(s): Unknown Other reaction(s): NAUSEA 'palpitation" Other reaction(s): NAUSEA Other reaction(s): NAUSEA     Current Facility-Administered Medications  Medication Dose Route Frequency Provider Last Rate Last Admin   0.9 %  sodium chloride infusion  250 mL Intravenous Continuous Rust-Chester, Cecelia Byars, NP   Paused at 08/23/21 0145   cefTRIAXone (  ROCEPHIN) 1 g in sodium chloride 0.9 % 100 mL IVPB  1 g Intravenous Q24H Rust-Chester, Britton L, NP       doxycycline (VIBRAMYCIN) 100 mg in sodium chloride 0.9 % 250 mL IVPB  100 mg Intravenous Q12H Rust-Chester, Cecelia Byars, NP   Stopped at 08/23/21 0255   fentaNYL (SUBLIMAZE) bolus via infusion 25-100 mcg  25-100 mcg Intravenous Q15 min PRN Rust-Chester, Cecelia Byars, NP       fentaNYL (SUBLIMAZE) injection 25 mcg  25 mcg Intravenous Once Rust-Chester, Micheline Rough L, NP       fentaNYL in NS (85mcg/ml) infusion-PREMIX  25-200 mcg/hr Intravenous Continuous Rust-Chester, Britton L, NP 7.5 mL/hr at  08/23/21 0600 75 mcg/hr at 08/23/21 0600   insulin aspart (novoLOG) injection 0-20 Units  0-20 Units Subcutaneous Q4H Rust-Chester, Britton L, NP   4 Units at 08/23/21 0432   ipratropium-albuterol (DUONEB) 0.5-2.5 (3) MG/3ML nebulizer solution 3 mL  3 mL Nebulization Q4H PRN Rust-Chester, Micheline Rough L, NP       midazolam (VERSED) injection 1 mg  1 mg Intravenous Q15 min PRN Rust-Chester, Micheline Rough L, NP   1 mg at 08/23/21 0340   midazolam (VERSED) injection 1 mg  1 mg Intravenous Q2H PRN Rust-Chester, Micheline Rough L, NP       norepinephrine (LEVOPHED) 4mg  in (0.016 mg/mL) premix infusion  0-40 mcg/min Intravenous Titrated Rust-Chester, Britton L, NP 75 mL/hr at 08/23/21 0600 20 mcg/min at 08/23/21 0600   Oral care mouth rinse  15 mL Mouth Rinse Q2H Rust-Chester, Britton L, NP   15 mL at 08/23/21 0519   pantoprazole (PROTONIX) injection 40 mg  40 mg Intravenous Q24H Rust-Chester, Britton L, NP   40 mg at 08/23/21 0248   vasopressin (PITRESSIN) 20 Units in sodium chloride 0.9 % 100 mL infusion-*FOR SHOCK*  0-0.04 Units/min Intravenous Continuous Rust-Chester, Britton L, NP 12 mL/hr at 08/23/21 0600 0.04 Units/min at 08/23/21 0600     Review of Systems unable to perform ROS due to intubation and sedation  Physical Exam Blood pressure (!) 113/56, pulse 60, temperature (!) 97.2 F (36.2 C), resp. rate 16, height 5\' 10"  (1.778 m), weight 73 kg, SpO2 100 %. CONSTITUTIONAL: Patient is sedated and intubated. EYES: Pupils are equal, round, and reactive to light, Sclera are non-icteric. EARS, NOSE, MOUTH AND THROAT: intubated and sedated. LYMPH NODES:  Lymph nodes in the neck are normal. RESPIRATORY:  Lungs are coarse. Pt  vent support,t, with equal breath sounds bilaterally, CARDIOVASCULAR: Heart is regular without murmurs, gallops, or rubs. GI: The abdomen is  soft, nontender, some distention no obvious peritoneal signs although exam is limited due to mentation.  Tthere are no palpable masses. There is no  hepatosplenomegaly. There are decrease bowel sounds GU: Rectal deferred.   MUSCULOSKELETAL: No cyanosis legs warm.   SKIN: Turgor is good and there are no pathologic skin lesions or ulcers. NEUROLOGIC: sedated, and intubated does not seem to be focal Data Reviewed  I have personally reviewed the patient's imaging, laboratory findings and medical records.    Assessment/Plan 86 yo in multiorgan failure and SBO ( shock bowel) Sepsis of pulmonary origin ? At this time any surgical intervention is futile. HE will not survive any procedures and I do not expect he will survive this hospitalization. Unfortunately not much I can offer him at this time. Recommend discussion with the family about goals of care. I did mention this to the wife who still in shock. She thinks he did not want  to be full code but right now she does not want to make that decision. Caryl Asp is his daughter and is one of our PACU nurses. They understand his state of fragility and the current situation.  For now continue support ngt and current rx I will be available  I spent > 75 minutes  in this encounter including coordination of his care, counseling the family, placing orders and performing appropriate documentation.     Sterling Big, MD FACS General Surgeon 08/23/2021, 7:09 AM

## 2021-08-23 NOTE — Progress Notes (Signed)
1.5 L total fluid challenge given this shift. Vasopressin added as pt's vasopressor requirements going up despite increasing pt's intravascular volume. Albumin also given this shift.

## 2021-08-23 NOTE — Progress Notes (Addendum)
Small Bowel Obstruction CT abdomen/pelvis wo contrast 08/22/21:  1.Marked fluid distension of the stomach with multiple dilated fluid-filled loops of proximal and mid small bowel consistent with obstruction. Point of transition within the left lower quadrant distal small bowel slightly inferior to the umbilicus where there is slightly decreased appearance of the small bowel on sagittal images and slight swirling appearance of the mesentery. Findings could be secondary to internal hernia, incomplete volvulus, or adhesive disease. There is mesenteric vascular congestion associated with obstructed small bowel loops without gross bowel wall thickening or frank intramural air at this time. No free air. Small volume ascites within the abdomen and pelvis. 2. Heterogeneous airspace disease and consolidations at the lung bases, right middle lobe and lingula suspicious for multifocal pneumonia. Potential component of aspiration at the lung bases given findings of bowel obstruction in the abdomen. Suggest NG tube decompression of the stomach 3. Cardiomegaly with small pleural effusions. Generalized subcutaneous edema consistent with anasarca. 4. Enlarged prostate 5. Gallstones  Spoke with Dr. Everlene Farrier, who agreed with NGT decompression and no surgical intervention at this time. - NGT to ILWS - NPO, will discontinue any PO meds ordered for tomorrow - as lactic remains high, will reconsider additional IVF resuscitation if respiratory status stabilizes after NGT placement - consult to general surgery, appreciate input - continue to support respiratory status   Andrew [name], AGACNP-BC Acute Care Nurse Practitioner [facility]   [phone] / [phone] Please see Amion for pager details.

## 2021-08-23 NOTE — Progress Notes (Signed)
ANTICOAGULATION CONSULT NOTE - Initial Consult  Pharmacy Consult for Lovenox, therapeutic Indication: atrial fibrillation    (on Warfarin PTA)  Allergies  Allergen Reactions   Methyldopa Nausea Only and Palpitations    Other reaction(s): Unknown Other reaction(s): NAUSEA 'palpitation" Other reaction(s): NAUSEA Other reaction(s): NAUSEA     Patient Measurements: Height: 5\' 10"  (177.8 cm) Weight: 72.6 kg (160 lb) IBW/kg (Calculated) : 73 Heparin Dosing Weight:    Vital Signs: Temp: 95.7 F (35.4 C) (07/22 0200) Temp Source: Esophageal (07/22 0200) BP: 121/56 (07/22 0200) Pulse Rate: 60 (07/22 0200)  Labs: Recent Labs    08/22/21 1821 08/22/21 2106 08/23/21 0145  HGB 10.5*  --   --   HCT 32.3*  --   --   PLT 140*  --   --   LABPROT  --   --  48.0*  INR  --   --  5.3*  CREATININE 1.40*  --   --   TROPONINIHS 204* 177*  --     Estimated Creatinine Clearance: 40.3 mL/min (A) (by C-G formula based on SCr of 1.4 mg/dL (H)).   Medical History: Past Medical History:  Diagnosis Date   CHF (congestive heart failure) (HCC)    Diabetes mellitus without complication (HCC)    Essential hypertension    Mixed hyperlipidemia    Permanent atrial fibrillation (HCC)    a. s/p catheter ablation ~ 12 yrs ago @ [facility] per family->unsuccessful; b. CHA2DS2VASc = 5-->chronic coumadin.    Medications:  Medications Prior to Admission  Medication Sig Dispense Refill Last Dose   amLODipine (NORVASC) 2.5 MG tablet Take 2.5 mg by mouth daily.      donepezil (ARICEPT) 10 MG tablet Take 10 mg by mouth at bedtime.      doxazosin (CARDURA) 4 MG tablet Take 4 mg by mouth Nightly.      furosemide (LASIX) 80 MG tablet Take 80 mg by mouth daily.      glimepiride (AMARYL) 2 MG tablet Take 4 mg by mouth daily.      guaiFENesin-dextromethorphan (ROBITUSSIN DM) 100-10 MG/5ML syrup Take 5 mLs by mouth every 4 (four) hours as needed for cough. 118 mL 0    lisinopril (ZESTRIL) 20 MG tablet Take  20 mg by mouth daily.      memantine (NAMENDA) 10 MG tablet Take 10 mg by mouth daily.      metFORMIN (GLUCOPHAGE) 1000 MG tablet Take 1,000 mg by mouth 2 (two) times daily.      pioglitazone (ACTOS) 15 MG tablet Take 15 mg by mouth daily.       Potassium 99 MG TABS Take 1.5 tablets by mouth daily.      simvastatin (ZOCOR) 20 MG tablet Take 20 mg by mouth Nightly.      vitamin B-12 (CYANOCOBALAMIN) 1000 MCG tablet Take 1,000 mcg by mouth daily.      warfarin (COUMADIN) 2.5 MG tablet Take 1.25-2.5 mg by mouth as directed. Take 1/2 tablet (1.25 mg) on Tues,Thur,Sunday. Take 1 tablet (2.5 mg) on Monday, Wednesday, Friday, Saturday.  (As of anticoag visit note from 08/14/21)      Scheduled:   fentaNYL (SUBLIMAZE) injection  25 mcg Intravenous Once   insulin aspart  0-20 Units Subcutaneous Q4H   mouth rinse  15 mL Mouth Rinse Q2H   pantoprazole (PROTONIX) IV  40 mg Intravenous Q24H   Infusions:   sodium chloride Stopped (08/23/21 0145)   cefTRIAXone (ROCEPHIN)  IV     doxycycline (VIBRAMYCIN) IV  125 mL/hr at 08/23/21 0200   fentaNYL infusion INTRAVENOUS 25 mcg/hr (08/23/21 0200)   norepinephrine (LEVOPHED) Adult infusion 8 mcg/min (08/23/21 0200)   Home Warfarin dose: 2.5 mg Mon,Wed,Fri,Sat 1.25 mg Tus, Thur, Sun  Assessment: 86 yo male to transition to therapeutic lovenox for Afib as NPO with SBO an unable to take warfarin  Hgb 10.5  plt 140  INR 5.3  -recent Rx for keflex for ?UTI on 08/21/21  Goal of Therapy:  Monitor platelets by anticoagulation protocol: Yes   Plan:  Will assess INR daily. Once <2.0 will begin lovenox 1 mg/kg q12h  for Afib -Follow for resumption of PTA Warfarin -follow CBC per protocol  Delbra Zellars A 08/23/2021,2:59 AM

## 2021-08-24 ENCOUNTER — Inpatient Hospital Stay (HOSPITAL_COMMUNITY)
Admit: 2021-08-24 | Discharge: 2021-08-24 | Disposition: A | Discharge status: Home / Self Care | Payer: Medicare HMO | Attending: Pulmonary Disease | Admitting: Pulmonary Disease

## 2021-08-24 ENCOUNTER — Inpatient Hospital Stay: Payer: Medicare HMO

## 2021-08-24 DIAGNOSIS — A419 Sepsis, unspecified organism: Secondary | ICD-10-CM | POA: Diagnosis not present

## 2021-08-24 DIAGNOSIS — I428 Other cardiomyopathies: Secondary | ICD-10-CM

## 2021-08-24 DIAGNOSIS — N179 Acute kidney failure, unspecified: Secondary | ICD-10-CM

## 2021-08-24 DIAGNOSIS — R6520 Severe sepsis without septic shock: Secondary | ICD-10-CM | POA: Diagnosis not present

## 2021-08-24 DIAGNOSIS — J9601 Acute respiratory failure with hypoxia: Secondary | ICD-10-CM | POA: Diagnosis not present

## 2021-08-24 DIAGNOSIS — J189 Pneumonia, unspecified organism: Secondary | ICD-10-CM | POA: Diagnosis not present

## 2021-08-24 DIAGNOSIS — J9602 Acute respiratory failure with hypercapnia: Secondary | ICD-10-CM

## 2021-08-24 DIAGNOSIS — K56609 Unspecified intestinal obstruction, unspecified as to partial versus complete obstruction: Secondary | ICD-10-CM | POA: Diagnosis not present

## 2021-08-24 LAB — ECHOCARDIOGRAM COMPLETE
AR max vel: 1.07 cm2
AV Area VTI: 1.15 cm2
AV Mean grad: 9 mmHg
AV Peak grad: 15.5 mmHg
Ao pk vel: 1.97 m/s
Area-P 1/2: 2.62 cm2
Calc EF: 37.6 %
Height: 70 in
S' Lateral: 4.21 cm
Single Plane A2C EF: 38.4 %
Single Plane A4C EF: 34.4 %
Weight: 2998.26 oz

## 2021-08-24 LAB — BASIC METABOLIC PANEL
Anion gap: 7 (ref 5–15)
BUN: 67 mg/dL — ABNORMAL HIGH (ref 8–23)
CO2: 26 mmol/L (ref 22–32)
Calcium: 8 mg/dL — ABNORMAL LOW (ref 8.9–10.3)
Chloride: 92 mmol/L — ABNORMAL LOW (ref 98–111)
Creatinine, Ser: 1.19 mg/dL (ref 0.61–1.24)
GFR, Estimated: >60 mL/min (ref >60)
Glucose, Bld: 183 mg/dL — ABNORMAL HIGH (ref 70–99)
Potassium: 4 mmol/L (ref 3.5–5.1)
Sodium: 125 mmol/L — ABNORMAL LOW (ref 135–145)

## 2021-08-24 LAB — GLUCOSE, CAPILLARY
Glucose-Capillary: 131 mg/dL — ABNORMAL HIGH (ref 70–99)
Glucose-Capillary: 146 mg/dL — ABNORMAL HIGH (ref 70–99)
Glucose-Capillary: 160 mg/dL — ABNORMAL HIGH (ref 70–99)
Glucose-Capillary: 166 mg/dL — ABNORMAL HIGH (ref 70–99)
Glucose-Capillary: 74 mg/dL (ref 70–99)
Glucose-Capillary: 84 mg/dL (ref 70–99)

## 2021-08-24 LAB — CBC
HCT: 24.3 % — ABNORMAL LOW (ref 39.0–52.0)
Hemoglobin: 8 g/dL — ABNORMAL LOW (ref 13.0–17.0)
MCH: 30.4 pg (ref 26.0–34.0)
MCHC: 32.9 g/dL (ref 30.0–36.0)
MCV: 92.4 fL (ref 80.0–100.0)
Platelets: 93 10*3/uL — ABNORMAL LOW (ref 150–400)
RBC: 2.63 MIL/uL — ABNORMAL LOW (ref 4.22–5.81)
RDW: 14.9 % (ref 11.5–15.5)
WBC: 6.2 10*3/uL (ref 4.0–10.5)
nRBC: 0 % (ref 0.0–0.2)

## 2021-08-24 LAB — PHOSPHORUS: Phosphorus: 3.7 mg/dL (ref 2.5–4.6)

## 2021-08-24 LAB — TRIGLYCERIDES: Triglycerides: 37 mg/dL (ref <150)

## 2021-08-24 LAB — PROCALCITONIN: Procalcitonin: 4.68 ng/mL

## 2021-08-24 LAB — PROTIME-INR
INR: 4.9 (ref 0.8–1.2)
Prothrombin Time: 45.5 seconds — ABNORMAL HIGH (ref 11.4–15.2)

## 2021-08-24 LAB — MAGNESIUM: Magnesium: 2.2 mg/dL (ref 1.7–2.4)

## 2021-08-24 MED ORDER — FUROSEMIDE 10 MG/ML IJ SOLN
40.0000 mg | Freq: Once | INTRAMUSCULAR | Status: AC
  Administered 2021-08-24: 40 mg via INTRAVENOUS
  Filled 2021-08-24: qty 4

## 2021-08-24 NOTE — Progress Notes (Signed)
Pharmacy Electrolyte Monitoring Consult:  Pharmacy consulted to assist in monitoring and replacing electrolytes in this 86 y.o. male admitted on 08/22/2021 with Respiratory Distress (Patient from home, dispatch called for SOB; Upon arrival, patient was 50% on RA and was immediately placed on CPAP (85% - 89% on CPAP); Received 1 Duoneb, Solu-Medrol 125 mg IV, and Mg 2 g IV en route)   Labs:  Sodium (mmol/L)  Date Value  08/24/2021 125 (L)   Potassium (mmol/L)  Date Value  08/24/2021 4.0   Magnesium (mg/dL)  Date Value  74/94/4967 2.2   Phosphorus (mg/dL)  Date Value  59/16/3846 3.7   Calcium (mg/dL)  Date Value  65/99/3570 8.0 (L)   Albumin (g/dL)  Date Value  17/79/3903 3.2 (L)    Assessment/Plan: Electrolytes WNL, no replacement at this time. F/u electrolytes with next labs  Martyn Malay 08/24/2021 1:16 PM

## 2021-08-24 NOTE — Progress Notes (Signed)
*  PRELIMINARY RESULTS* Echocardiogram 2D Echocardiogram has been performed.  Andrew [name] 08/24/2021, 12:54 PM

## 2021-08-24 NOTE — Progress Notes (Signed)
CC: partial sbo Subjective: Weaning off pressors, Still coagulopathic and some acidosis Xray prs. Reviewed showing improved bowel gas, no free air  Objective: Vital signs in last 24 hours: Temp:  [97.5 F (36.4 C)-98.9 F (37.2 C)] 97.5 F (36.4 C) (07/23 1600) Pulse Rate:  [56-60] 60 (07/23 1800) Resp:  [18] 18 (07/23 1800) BP: (90-133)/(54-75) 90/55 (07/23 1800) SpO2:  [99 %-100 %] 100 % (07/23 1800) FiO2 (%):  [30 %-35 %] 30 % (07/23 1610) Weight:  [85 kg] 85 kg (07/23 0311) Last BM Date :  (PTA)  Intake/Output from previous day: 07/22 0701 - 07/23 0700 In: 2413 [I.V.:1753.4; IV Piggyback:659.6] Out: 2295 [Urine:1945; Emesis/NG output:350] Intake/Output this shift: Total I/O In: 839.8 [I.V.:449.8; NG/GT:40; IV Piggyback:350] Out: 1800 [Urine:1800]  Physical exam: Sedated, on the vent Abd: soft mildly distended, no peritonitis  Lab Results: CBC  Recent Labs    08/23/21 0444 08/24/21 0308  WBC 8.8 6.2  HGB 9.0* 8.0*  HCT 27.4* 24.3*  PLT 136* 93*   BMET Recent Labs    08/23/21 0444 08/23/21 1125 08/23/21 1821 08/24/21 0308  NA 125*   < > 127* 125*  K 4.3  --   --  4.0  CL 91*  --   --  92*  CO2 27  --   --  26  GLUCOSE 229*  --   --  183*  BUN 76*  --   --  67*  CREATININE 1.30*  --   --  1.19  CALCIUM 8.1*  --   --  8.0*   < > = values in this interval not displayed.   PT/INR Recent Labs    08/23/21 0145 08/24/21 0921  LABPROT 48.0* 45.5*  INR 5.3* 4.9*   ABG Recent Labs    08/22/21 2112 08/23/21 0130  PHART 7.25* 7.33*  HCO3 23.7 25.3    Studies/Results: ECHOCARDIOGRAM COMPLETE  Result Date: 08/24/2021    ECHOCARDIOGRAM REPORT   Patient Name:   Andrew [name] Date of Exam: 08/24/2021 Medical Rec #:  [id number]        Height:       70.0 in Accession #:    [id number]       Weight:       187.4 lb Date of Birth:  06/12/1935         BSA:          2.030 m Patient Age:    86 years         BP:           98/60 mmHg Patient Gender: M                 HR:           60 bpm. Exam Location:  [facility] Procedure: 2D Echo Indications:     Cardiomyopathy I42.9  History:         Patient has prior history of Echocardiogram examinations, most                  recent 03/25/2018.  Sonographer:     Kathlen Brunswick RDCS Referring Phys:  [id number] BRITTON L [place] Diagnosing Phys: Skeet Latch MD  Sonographer Comments: Echo performed with patient supine and on artificial respirator. IMPRESSIONS  1. Left ventricular ejection fraction, by estimation, is 25 to 30%. The left ventricle has severely decreased function. The left ventricle demonstrates global hypokinesis. There is moderate concentric left ventricular hypertrophy. Left ventricular diastolic parameters are consistent with Grade II  diastolic dysfunction (pseudonormalization). Elevated left atrial pressure.  2. Right ventricular systolic function is normal. The right ventricular size is normal.  3. Left atrial size was severely dilated.  4. Right atrial size was mildly dilated.  5. The mitral valve is normal in structure. Trivial mitral valve regurgitation. No evidence of mitral stenosis.  6. Severely restricted motion of the left and right coronary cusps. Aortic valve area by planimitry is 0.3. Dimensionless index 0.3. Suspect low-flow, low-gradient aortic stenosis. Likely moderate to severe aortic stenosis. The aortic valve is calcified. There is severe calcifcation of the aortic valve. There is severe thickening of the aortic valve. Aortic valve regurgitation is not visualized. No aortic stenosis is present.  7. Aortic dilatation noted. There is mild dilatation of the aortic root, measuring 38 mm.  8. The inferior vena cava is normal in size with greater than 50% respiratory variability, suggesting right atrial pressure of 3 mmHg. FINDINGS  Left Ventricle: Left ventricular ejection fraction, by estimation, is 25 to 30%. The left ventricle has severely decreased function. The left ventricle demonstrates global  hypokinesis. The left ventricular internal cavity size was normal in size. There is moderate concentric left ventricular hypertrophy. Left ventricular diastolic parameters are consistent with Grade II diastolic dysfunction (pseudonormalization). Elevated left atrial pressure. Right Ventricle: The right ventricular size is normal. No increase in right ventricular wall thickness. Right ventricular systolic function is normal. Left Atrium: Left atrial size was severely dilated. Right Atrium: Right atrial size was mildly dilated. Pericardium: There is no evidence of pericardial effusion. Mitral Valve: The mitral valve is normal in structure. Mild mitral annular calcification. Trivial mitral valve regurgitation. No evidence of mitral valve stenosis. Tricuspid Valve: The tricuspid valve is normal in structure. Tricuspid valve regurgitation is mild . No evidence of tricuspid stenosis. Aortic Valve: Severely restricted motion of the left and right coronary cusps. Aortic valve area by planimitry is 0.3. Dimensionless index 0.3. Suspect low-flow, low-gradient aortic stenosis. Likely moderate to severe aortic stenosis. The aortic valve is  calcified. There is severe calcifcation of the aortic valve. There is severe thickening of the aortic valve. Aortic valve regurgitation is not visualized. No aortic stenosis is present. Aortic valve mean gradient measures 9.0 mmHg. Aortic valve peak gradient measures 15.5 mmHg. Aortic valve area, by VTI measures 1.15 cm. Pulmonic Valve: The pulmonic valve was normal in structure. Pulmonic valve regurgitation is not visualized. No evidence of pulmonic stenosis. Aorta: Aortic dilatation noted. There is mild dilatation of the aortic root, measuring 38 mm. Venous: The inferior vena cava is normal in size with greater than 50% respiratory variability, suggesting right atrial pressure of 3 mmHg. IAS/Shunts: No atrial level shunt detected by color flow Doppler. Additional Comments: A device lead  is visualized.  LEFT VENTRICLE PLAX 2D LVIDd:         4.97 cm      Diastology LVIDs:         4.21 cm      LV e' medial:    3.70 cm/s LV PW:         1.53 cm      LV E/e' medial:  20.5 LV IVS:        1.30 cm      LV e' lateral:   5.00 cm/s LVOT diam:     2.20 cm      LV E/e' lateral: 15.2 LV SV:         46 LV SV Index:   23 LVOT Area:  3.80 cm  LV Volumes (MOD) LV vol d, MOD A2C: 120.0 ml LV vol d, MOD A4C: 160.0 ml LV vol s, MOD A2C: 73.9 ml LV vol s, MOD A4C: 105.0 ml LV SV MOD A2C:     46.1 ml LV SV MOD A4C:     160.0 ml LV SV MOD BP:      53.6 ml RIGHT VENTRICLE RV Basal diam:  3.45 cm RV S prime:     8.49 cm/s TAPSE (M-mode): 1.2 cm LEFT ATRIUM              Index        RIGHT ATRIUM           Index LA diam:        6.30 cm  3.10 cm/m   RA Area:     21.70 cm LA Vol (A2C):   111.0 ml 54.67 ml/m  RA Volume:   64.60 ml  31.82 ml/m LA Vol (A4C):   98.0 ml  48.27 ml/m LA Biplane Vol: 105.0 ml 51.72 ml/m  AORTIC VALVE                    PULMONIC VALVE AV Area (Vmax):    1.07 cm     PV Vmax:       0.81 m/s AV Area (VTI):     1.15 cm     PV Peak grad:  2.7 mmHg AV Vmax:           197.10 cm/s AV VTI:            0.401 m AV Peak Grad:      15.5 mmHg AV Mean Grad:      9.0 mmHg LVOT Vmax:         55.50 cm/s LVOT Vmean:        33.700 cm/s LVOT VTI:          0.121 m LVOT/AV VTI ratio: 0.30  AORTA Ao Root diam: 3.80 cm Ao Asc diam:  3.30 cm MITRAL VALVE               TRICUSPID VALVE MV Area (PHT): 2.62 cm    TV Peak grad:   22.0 mmHg MV Decel Time: 289 msec    TV Vmax:        2.35 m/s MV E velocity: 75.80 cm/s  TR Peak grad:   21.2 mmHg                            TR Vmax:        230.00 cm/s                             SHUNTS                            Systemic VTI:  0.12 m                            Systemic Diam: 2.20 cm Skeet Latch MD Electronically signed by Skeet Latch MD Signature Date/Time: 08/24/2021/1:38:28 PM    Final    DG Abd 2 Views  Result Date: 08/24/2021 CLINICAL DATA:  Follow-up small  bowel obstruction. EXAM: ABDOMEN - 2 VIEW COMPARISON:  08/23/2021 FINDINGS: Enteric tube tip and side port are in the gastric fundus.  Gallstones identified in the right upper quadrant of the abdomen. No dilated loops of small or large bowel. Gas identified within the colon up to the rectum. IMPRESSION: 1. Nonobstructive bowel gas pattern. 2. Cholelithiasis. Electronically Signed   By: Signa Kell M.D.   On: 08/24/2021 07:15   DG Chest Port 1 View  Result Date: 08/23/2021 CLINICAL DATA:  Status post endotracheal tube, central line and orogastric tube placement. EXAM: PORTABLE CHEST 1 VIEW COMPARISON:  August 22, 2021 FINDINGS: Since the prior study, there is been interval endotracheal tube placement with its distal tip approximately 5.5 cm from the carina. Interval orogastric placement is also seen with its distal tip overlying the expected region of the gastric fundus. A left internal jugular venous catheter is present with its distal tip overlying the mid superior vena cava. This is approximately 3.4 cm proximal to the junction of the superior vena cava and right atrium. Stable single lead ventricular AICD positioning is noted. Mild to moderate severity bilateral multifocal infiltrates are seen. This is increased in severity when compared to the prior exam. No pleural effusion or pneumothorax is identified. The heart size and mediastinal contours are within normal limits. There is moderate severity calcification of the thoracic aorta. The visualized skeletal structures are unremarkable. IMPRESSION: 1. Mild to moderate severity bilateral multifocal infiltrates, increased in severity when compared to the prior exam. 2. Interval endotracheal tube, orogastric tube and left internal jugular venous catheter placement and positioning, as described above. Electronically Signed   By: Aram Candela M.D.   On: 08/23/2021 03:01   DG Abd 1 View  Result Date: 08/23/2021 CLINICAL DATA:  Orogastric tube placement  EXAM: ABDOMEN - 1 VIEW COMPARISON:  None Available. FINDINGS: Tip of the orogastric tube projects over the left hemidiaphragm. Proximal course is not visualized. Could be in the left lower lobe bronchus. IMPRESSION: Orogastric tube tip projecting over the left hemidiaphragm, possibly in the left lower lobe bronchus. Replacement and/or chest radiograph recommended. These results will be called to the ordering clinician or representative by the Radiologist Assistant, and communication documented in the PACS or [employer]. Electronically Signed   By: Deatra Robinson M.D.   On: 08/23/2021 01:07   CT HEAD WO CONTRAST ( )  Result Date: 08/22/2021 CLINICAL DATA:  Sepsis EXAM: CT HEAD WITHOUT CONTRAST TECHNIQUE: Contiguous axial images were obtained from the base of the skull through the vertex without intravenous contrast. RADIATION DOSE REDUCTION: This exam was performed according to the departmental dose-optimization program which includes automated exposure control, adjustment of the mA and/or kV according to patient size and/or use of iterative reconstruction technique. COMPARISON:  None Available. FINDINGS: Brain: Motion degradation on multiple images. No acute territorial infarction, hemorrhage or intracranial mass. Atrophy and chronic small vessel ischemic changes of the white matter. Small chronic infarcts in the right cerebellum. Vascular: No hyperdense vessels.  Carotid vascular calcification. Skull: Normal. Negative for fracture or focal lesion. Sinuses/Orbits: Patchy mucosal thickening in the sinuses Other: None IMPRESSION: 1. Motion degraded study 2. No CT evidence for acute intracranial abnormality. 3. Atrophy and chronic small vessel ischemic changes of the white matter Electronically Signed   By: Jasmine Pang M.D.   On: 08/22/2021 23:53   CT ABDOMEN PELVIS WO CONTRAST  Result Date: 08/22/2021 CLINICAL DATA:  Abdomen pain sepsis EXAM: CT ABDOMEN AND PELVIS WITHOUT CONTRAST TECHNIQUE:  Multidetector CT imaging of the abdomen and pelvis was performed following the standard protocol without IV contrast. RADIATION DOSE REDUCTION: This exam was performed  according to the departmental dose-optimization program which includes automated exposure control, adjustment of the mA and/or kV according to patient size and/or use of iterative reconstruction technique. COMPARISON:  None Available. FINDINGS: Lower chest: Lung bases demonstrate heterogeneous consolidation and ground-glass density in the bilateral lower lobes, right middle lobe and lingula, suspicious for pneumonia. Small bilateral pleural effusions. Cardiomegaly. Partially visualized pacing leads. Coronary vascular calcification Hepatobiliary: Gallstones. No focal hepatic abnormality. No biliary dilatation Pancreas: Unremarkable. No pancreatic ductal dilatation or surrounding inflammatory changes. Spleen: Normal in size without focal abnormality. Adrenals/Urinary Tract: Adrenal glands are within normal limits. Kidneys show no hydronephrosis. Multiple low-density lesions, likely cysts. No specific imaging follow-up is recommended. Foley catheter in the bladder which is decompressed Stomach/Bowel: Marked fluid distension of the stomach. Multiple dilated fluid-filled loops of proximal and mid small bowel, consistent with obstruction. Transition point visualized in the distal small bowel in the left lower quadrant, series 2, image 69 and sagittal series 6 image 87 where there is beaked appearance of the small bowel. Small bowel distal to this within the anterior pelvis is completely decompressed. No acute bowel wall thickening. Frothy stools in the colon. Slight swirling appearance of the mesentery in the region of transition point, series 2, image 64 through 67. Vascular/Lymphatic: Moderate aortic atherosclerosis. No aneurysm. No suspicious lymph nodes. Edema and mesenteric vascular congestion associated with obstructed bowel loops, series 2, image 65.  Reproductive: Enlarged prostate. Other: No free air. Small volume free fluid within the abdomen and pelvis. Generalized subcutaneous edema consistent with anasarca. Musculoskeletal: No acute osseous abnormality IMPRESSION: 1. Marked fluid distension of the stomach with multiple dilated fluid-filled loops of proximal and mid small bowel consistent with obstruction. Point of transition within the left lower quadrant distal small bowel slightly inferior to the umbilicus where there is slightly decreased appearance of the small bowel on sagittal images and slight swirling appearance of the mesentery. Findings could be secondary to internal hernia, incomplete volvulus, or adhesive disease. There is mesenteric vascular congestion associated with obstructed small bowel loops without gross bowel wall thickening or frank intramural air at this time. No free air. Small volume ascites within the abdomen and pelvis. 2. Heterogeneous airspace disease and consolidations at the lung bases, right middle lobe and lingula suspicious for multifocal pneumonia. Potential component of aspiration at the lung bases given findings of bowel obstruction in the abdomen. Suggest NG tube decompression of the stomach 3. Cardiomegaly with small pleural effusions. Generalized subcutaneous edema consistent with anasarca. 4. Enlarged prostate 5. Gallstones Electronically Signed   By: Jasmine Pang M.D.   On: 08/22/2021 23:50    Anti-infectives: Anti-infectives (From admission, onward)    Start     Dose/Rate Route Frequency Ordered Stop   08/23/21 0800  cefTRIAXone (ROCEPHIN) 1 g in sodium chloride 0.9 % 100 mL IVPB        1 g 200 mL/hr over 30 Minutes Intravenous Every 24 hours 08/22/21 2208 09/02/21 0759   08/23/21 0000  doxycycline (VIBRAMYCIN) 100 mg in sodium chloride 0.9 % 250 mL IVPB        100 mg 125 mL/hr over 120 Minutes Intravenous Every 12 hours 08/22/21 2208 08/29/21 2359   08/22/21 1930  vancomycin (VANCOREADY) IVPB 1750  mg/350 mL        1,750 mg 175 mL/hr over 120 Minutes Intravenous  Once 08/22/21 1915 08/22/21 2358   08/22/21 1915  ceFEPIme (MAXIPIME) 2 g in sodium chloride 0.9 % 100 mL IVPB  2 g 200 mL/hr over 30 Minutes Intravenous STAT 08/22/21 1914 08/22/21 2024       Assessment/Plan:  Partial SBO likely from " Shock bowel" No surgical indication and not a candidate  Seems to be improving from abdominal perspective D/w wife in detail who is at bedside We will sign off I spent 35 minutes  in this encounter including coordination of his care, counseling the family, placing orders and performing appropriate documentation.  Caroleen Hamman, MD, [facility]  08/24/2021

## 2021-08-24 NOTE — Progress Notes (Signed)
NAME:  Andrew [name], MRN:  [id number], DOB:  28-Aug-1935, LOS: 2 ADMISSION DATE:  08/22/2021, CONSULTATION DATE:  08/22/21 REFERRING MD:  Dr. Fanny Bien, CHIEF COMPLAINT:  Shortness of Breath   History of Present Illness:  86 yo M presenting to [facility] ED from home via EMS with complaints of dyspnea, hypoxia & fatigue. History provided by the patient, and the patient's wife and daughter who are bedside. The patient was in his normal state of health until Thursday 08/21/21 when the patient developed lower abdominal pain and the "shakes". He was diagnosed with a UTI and started on Keflex. Then on 08/22/21 he took a nap and was awoken by his wife around 4:30 pm to take some medicine. At this time he woke up and was dyspneic, unable to complete a sentence, as well as severely fatigued unable to stand. EMS was called and upon their arrival his SpO2 was in the 50's, he was placed on CPAP improving to 85-89% and received 125 mg of Solumedrol, 2 g of Mg & 1 duo neb en route. Of note the patient also had an unwitnessed fall on 7/20, he has been taking all medication as prescribed including his Warfarin.  ED course: Upon arrival the patient was placed on the BIPAP for oxygen support, Sepsis protocol initiated. Due to respiratory status and concerns for fluid volume overload, only 1 L of LR bolus with improvement in color and perfusion per ED report. Lab work revealed severe lactic acidosis with mild leukocytosis and an elevated PCT, hyperglycemia, hyponatremia with pseudohyponatremia component, thrombocytopenia- though only slightly lower than baseline, AKI, elevated troponin- suspect from demand ischemia & respiratory acidosis Medications given: Cefepime & vancomycin, Duo neb, 1 L of LR Initial Vitals: 97.5, 18, 60, 105/49 & SpO2 93% on BIPAP Significant labs: (Labs/ Imaging personally reviewed) I, Cheryll Cockayne Rust-Chester, AGACNP-BC, personally viewed and interpreted this ECG. EKG Interpretation: Date: 08/22/21, EKG  Time: 18:18, Rate: 60, Rhythm: A-fib with permanent pacemaker in place, QRS Axis: extreme axis, Intervals: prolonged Qtc in the setting of paced rhythm, ST/T Wave abnormalities: none, Narrative Interpretation: Atrial Fibrillation with permanent pacemaker Chemistry: Na+: 124, K+: 4.4, BUN/Cr.: 69/ 1.40, Serum CO2/ AG: 25/ 11, Glucose: 398 Hematology: WBC: 11.3, Hgb: 10.5, plt: 140  Troponin: 204 > 177, BNP: 97.9, Lactic/ PCT: 6.4 > 5.1/ 6.87, COVID-19 & Influenza A/B: negative VBG: 7.16/ 66/ 42/ 23.5 >> ABG: 7.25/ 64/ 92/ 23.7  CXR 08/22/21: Bilateral multifocal airspace opacities concerning for multifocal pneumonia vs pulmonary edema CT head wo contrast 08/22/21: pending CT abdomen/pelvis wo contrast 08/22/21: pending  PCCM consulted for admission due to acute hypoxic respiratory failure at HIGH RISK for intubation due to increased work of breathing.  Pertinent  Medical History  T2DM PAF on Coumadin HFpEF HLD HTN  Significant Hospital Events: Including procedures, antibiotic start and stop dates in addition to other pertinent events   08/22/21: Admit to ICU with acute hypoxic respiratory failure on BIPAP with HIGH RISK for INTUBATION due to increased WOB. 08/24/21: Patient was made DNR by the family surgery saw him for the small bowel obstruction she is not a surgical candidate he is slowly coming off pressors and his lactic acid normalized.  Interim History / Subjective:  Patient on 40% FiO2 continuous BIPAP, vitals stable, A&O x 3 (which appears to be baseline due to early stages of dementia) When asked he states his breathing "is fine", but he is unable to speak in complete sentences without having to catch his breath multiple times.  He is sitting back, resting but moderately dyspneic with any exertion. Discussed CODE status with the patient, wife and daughter bedside explaining my concern that due to is persistent dyspnea he is at a HIGH RISK for INTUBATION. They all confirmed FULL CODE  status.  - will give Lasix dose, solu medrol - STAT CT imaging, bladder scan and foley placement - will re-evaluate respiratory status after these interventions. Patient and family agree with plan of care, all questions and concerns answered at this time.  Objective   Blood pressure 103/62, pulse 60, temperature 98.9 F (37.2 C), temperature source Oral, resp. rate 18, height 5\' 10"  (1.778 m), weight 85 kg, SpO2 100 %.    Vent Mode: PRVC FiO2 (%):  [35 %-40 %] 35 % Set Rate:  [15 bmp-18 bmp] 18 bmp Vt Set:  [500 mL] 500 mL PEEP:  [5 cmH20] 5 cmH20 Plateau Pressure:  [15 cmH20-18 cmH20] 18 cmH20   Intake/Output Summary (Last 24 hours) at 08/24/2021 0751 Last data filed at 08/24/2021 0600 Gross per 24 hour  Intake 2412.95 ml  Output 2295 ml  Net 117.95 ml    Filed Weights   08/22/21 1832 08/23/21 0424 08/24/21 0311  Weight: 72.6 kg 73 kg 85 kg    Examination: General: Adult male, critically ill, lying in bed, on ventilator HEENT: MM pink/moist, anicteric, atraumatic, neck supple Neuro: Intubated sedated CV: s1s2 RRR, controlled A-fib with pacemaker on monitor, no r/m/g Pulm: Regular, good bilateral air entry and ventilator  GI: soft, distended, slightly tender lower abdomen, bs x 4 Skin: scattered ecchymosis, including LUE Extremities: warm/dry, pulses + 2 R/P, +1 edema noted BLE  Resolved Hospital Problem list     Assessment & Plan:  Acute Hypoxic/ Hypercapnic Respiratory Failure secondary to CAP in the setting of suspected pulmonary edema SBO aspiration -Continue ventilator wean FiO2 as tolerated - Supplemental O2 to maintain SpO2 > 90% - Intermittent chest x-ray & ABG PRN - Ensure adequate pulmonary hygiene  - F/u cultures, trend PCT - Continue CAP: ceftriaxone & doxycycline - will give one additional dose of solu-medrol - bronchodilators PRN  Suspected Severe Sepsis without septic shock due to suspected CAP/ UTI Patient has An SBO possible source of sepsis is on  antibiotic  Acute on Chronic HFrEF exacerbation Elevated Troponin secondary to demand ischemia Chronic Atrial Fibrillation PMHx: HFpEF, permanent pacemaker, HTN, HLD Troponins: 204 > 177 BNP: 97.9 ECHO 03/2018: LVEF 50-55%, mild to moderately dilated LV, RV moderately enlarged with mildly reduced systolic function. RV systolic pressure mild-moderately elevated (est. Pressure 44.8 mmHg). LA moderately dilated & RA mildly dilated - Echocardiogram ordered - Continuous cardiac monitoring  - Daily weights to assess volume status -Patient is third spacing anasarcic we will give a dose of Lasix  Acute Kidney Injury in the setting of suspected sepsis This is improving patient now is +8 L give a dose of Lasix  Acute Hyponatremia secondary to unknown etiology in the setting of slight pseudohyponatremia due to hyperglycemia Continue with gentle hydration this could be multifactorial due to dehydration SIADH or acute renal failure SBO third spacing at this point is not to the level where it needs to be given hypertonic saline we will monitor and he might need diuresis once he is outside of the septic shock  Type 2 Diabetes Mellitus Steroid Induced Hyperglycemia Hemoglobin A1C: pending - Monitor CBG Q 4 hours - SSI resistant dosing - target range while in ICU: 140-180 - follow ICU hyper/hypo-glycemia protocol  Dementia - early stages -  supportive care - continue outpatient medication regimen  Best Practice (right click and "Reselect all SmartList Selections" daily)  Diet/type: NPO w/ oral meds as respiratory status allows DVT prophylaxis: DOAC GI prophylaxis: PPI Lines: N/A Foley:  Yes, and it is still needed Code Status:  full code Last date of multidisciplinary goals of care discussion [08/22/21]  Labs   CBC: Recent Labs  Lab 08/22/21 1821 08/23/21 0444 08/24/21 0308  WBC 11.3* 8.8 6.2  NEUTROABS 9.9*  --   --   HGB 10.5* 9.0* 8.0*  HCT 32.3* 27.4* 24.3*  MCV 96.4 92.9 92.4   PLT 140* 136* 93*     Basic Metabolic Panel: Recent Labs  Lab 08/22/21 1821 08/22/21 2106 08/23/21 0145 08/23/21 0444 08/23/21 1125 08/23/21 1821 08/24/21 0308  NA 124*  --  126* 125* 127* 127* 125*  K 4.4  --   --  4.3  --   --  4.0  CL 88*  --   --  91*  --   --  92*  CO2 25  --   --  27  --   --  26  GLUCOSE 398*  --   --  229*  --   --  183*  BUN 69*  --   --  76*  --   --  67*  CREATININE 1.40*  --   --  1.30*  --   --  1.19  CALCIUM 8.5*  --   --  8.1*  --   --  8.0*  MG 2.8* 2.4  --  2.2  --   --  2.2  PHOS  --  4.8*  --  4.5  --   --  3.7    GFR: Estimated Creatinine Clearance: 47.7 mL/min (by C-G formula based on SCr of 1.19 mg/dL). Recent Labs  Lab 08/22/21 1821 08/22/21 1822 08/22/21 1825 08/22/21 1835 08/23/21 0444 08/23/21 0509 08/23/21 1125 08/24/21 0308  PROCALCITON  --   --   --  6.87 7.50  --   --  4.68  WBC 11.3*  --   --   --  8.8  --   --  6.2  LATICACIDVEN  --  6.4* 5.1*  --   --  2.3* 1.6  --      Liver Function Tests: Recent Labs  Lab 08/22/21 2106  AST 36  ALT 16  ALKPHOS 50  BILITOT 1.0  PROT 5.8*  ALBUMIN 3.2*   No results for input(s): "LIPASE", "AMYLASE" in the last 168 hours. No results for input(s): "AMMONIA" in the last 168 hours.  ABG    Component Value Date/Time   PHART 7.33 (L) 08/23/2021 0130   PCO2ART 48 08/23/2021 0130   PO2ART 299 (H) 08/23/2021 0130   HCO3 25.3 08/23/2021 0130   ACIDBASEDEF 0.9 08/23/2021 0130   O2SAT 99.9 08/23/2021 0130     Coagulation Profile: Recent Labs  Lab 08/23/21 0145  INR 5.3*    Cardiac Enzymes: No results for input(s): "CKTOTAL", "CKMB", "CKMBINDEX", "TROPONINI" in the last 168 hours.  HbA1C: Hgb A1c MFr Bld  Date/Time Value Ref Range Status  08/23/2021 01:45 AM 7.2 (H) 4.8 - 5.6 % Final    Comment:    (NOTE) Pre diabetes:          5.7%-6.4%  Diabetes:              >6.4%  Glycemic control for   <7.0% adults with diabetes   05/19/2021 09:23 PM 8.0 (H)  4.8 -  5.6 % Final    Comment:    (NOTE) Pre diabetes:          5.7%-6.4%  Diabetes:              >6.4%  Glycemic control for   <7.0% adults with diabetes     CBG: Recent Labs  Lab 08/23/21 1608 08/23/21 1946 08/23/21 2320 08/24/21 0334 08/24/21 0735  GLUCAP 163* 143* 149* 166* 160*     Past Medical History:  He,  has a past medical history of CHF (congestive heart failure) (HCC), Diabetes mellitus without complication (HCC), Essential hypertension, Mixed hyperlipidemia, and Permanent atrial fibrillation (HCC).   Surgical History:   Past Surgical History:  Procedure Laterality Date   APPENDECTOMY     INSERT / REPLACE / REMOVE PACEMAKER     PACEMAKER IMPLANT N/A 03/25/2018   Procedure: PACEMAKER IMPLANT;  Surgeon: Hillis Range, MD;  Location: [facility] INVASIVE CV LAB;  Service: Cardiovascular;  Laterality: N/A;     Social History:   reports that he has quit smoking. His smoking use included cigarettes. He has a 52.50 pack-year smoking history. He has never used smokeless tobacco. He reports that he does not currently use alcohol. He reports that he does not use drugs.   Family History:  His family history includes Bladder Cancer in his father; CVA in his mother.   Allergies Allergies  Allergen Reactions   Methyldopa Nausea Only and Palpitations    Other reaction(s): Unknown Other reaction(s): NAUSEA 'palpitation" Other reaction(s): NAUSEA Other reaction(s): NAUSEA      Home Medications  Prior to Admission medications   Medication Sig Start Date End Date Taking? Authorizing Provider  amLODipine (NORVASC) 2.5 MG tablet Take 2.5 mg by mouth daily. 05/02/21   [provider]  donepezil (ARICEPT) 10 MG tablet Take 10 mg by mouth at bedtime. 03/14/21   [provider]  doxazosin (CARDURA) 4 MG tablet Take 4 mg by mouth Nightly. 12/31/16   [provider]  furosemide (LASIX) 80 MG tablet Take 80 mg by mouth daily.    [provider]   glimepiride (AMARYL) 2 MG tablet Take 4 mg by mouth daily. 08/14/16   [provider]  guaiFENesin-dextromethorphan (ROBITUSSIN DM) 100-10 MG/5ML syrup Take 5 mLs by mouth every 4 (four) hours as needed for cough. 05/21/21   Hollice Espy, MD  lisinopril (ZESTRIL) 20 MG tablet Take 20 mg by mouth daily. 05/12/21   [provider]  memantine (NAMENDA) 10 MG tablet Take 10 mg by mouth daily. 05/18/21   [provider]  metFORMIN (GLUCOPHAGE) 1000 MG tablet Take 1,000 mg by mouth 2 (two) times daily.    [provider]  pioglitazone (ACTOS) 15 MG tablet Take 15 mg by mouth daily.     [provider]  Potassium 99 MG TABS Take 1.5 tablets by mouth daily.    [provider]  simvastatin (ZOCOR) 20 MG tablet Take 20 mg by mouth Nightly. 12/31/16   [provider]  vitamin B-12 (CYANOCOBALAMIN) 1000 MCG tablet Take 1,000 mcg by mouth daily.    [provider]  warfarin (COUMADIN) 2.5 MG tablet Take 1.25-2.5 mg by mouth as directed. Take 1/2 tablet (1.25 mg) on Monday, Wednesday and Friday. Take 1 tablet (2.5 mg) on Sunday, Tuesday, Thursday and Saturday. 01/17/21   [provider]      Please see Amion for pager details.

## 2021-08-25 ENCOUNTER — Inpatient Hospital Stay: Payer: Medicare HMO

## 2021-08-25 DIAGNOSIS — R579 Shock, unspecified: Secondary | ICD-10-CM

## 2021-08-25 DIAGNOSIS — R6520 Severe sepsis without septic shock: Secondary | ICD-10-CM | POA: Diagnosis not present

## 2021-08-25 DIAGNOSIS — K56609 Unspecified intestinal obstruction, unspecified as to partial versus complete obstruction: Secondary | ICD-10-CM | POA: Diagnosis not present

## 2021-08-25 DIAGNOSIS — J189 Pneumonia, unspecified organism: Secondary | ICD-10-CM | POA: Diagnosis not present

## 2021-08-25 DIAGNOSIS — A419 Sepsis, unspecified organism: Secondary | ICD-10-CM | POA: Diagnosis not present

## 2021-08-25 LAB — PROCALCITONIN: Procalcitonin: 2.95 ng/mL

## 2021-08-25 LAB — PROTIME-INR
INR: 4.1 (ref 0.8–1.2)
Prothrombin Time: 39.4 seconds — ABNORMAL HIGH (ref 11.4–15.2)

## 2021-08-25 LAB — BASIC METABOLIC PANEL
Anion gap: 8 (ref 5–15)
BUN: 54 mg/dL — ABNORMAL HIGH (ref 8–23)
CO2: 26 mmol/L (ref 22–32)
Calcium: 8 mg/dL — ABNORMAL LOW (ref 8.9–10.3)
Chloride: 95 mmol/L — ABNORMAL LOW (ref 98–111)
Creatinine, Ser: 0.95 mg/dL (ref 0.61–1.24)
GFR, Estimated: >60 mL/min (ref >60)
Glucose, Bld: 61 mg/dL — ABNORMAL LOW (ref 70–99)
Potassium: 3.5 mmol/L (ref 3.5–5.1)
Sodium: 129 mmol/L — ABNORMAL LOW (ref 135–145)

## 2021-08-25 LAB — CBC WITH DIFFERENTIAL/PLATELET
Abs Immature Granulocytes: 0.01 10*3/uL (ref 0.00–0.07)
Basophils Absolute: 0 10*3/uL (ref 0.0–0.1)
Basophils Relative: 0 %
Eosinophils Absolute: 0 10*3/uL (ref 0.0–0.5)
Eosinophils Relative: 0 %
HCT: 22.9 % — ABNORMAL LOW (ref 39.0–52.0)
Hemoglobin: 7.6 g/dL — ABNORMAL LOW (ref 13.0–17.0)
Immature Granulocytes: 0 %
Lymphocytes Relative: 14 %
Lymphs Abs: 0.5 10*3/uL — ABNORMAL LOW (ref 0.7–4.0)
MCH: 30.4 pg (ref 26.0–34.0)
MCHC: 33.2 g/dL (ref 30.0–36.0)
MCV: 91.6 fL (ref 80.0–100.0)
Monocytes Absolute: 0.3 10*3/uL (ref 0.1–1.0)
Monocytes Relative: 9 %
Neutro Abs: 2.7 10*3/uL (ref 1.7–7.7)
Neutrophils Relative %: 77 %
Platelets: 74 10*3/uL — ABNORMAL LOW (ref 150–400)
RBC: 2.5 MIL/uL — ABNORMAL LOW (ref 4.22–5.81)
RDW: 14.6 % (ref 11.5–15.5)
WBC: 3.5 10*3/uL — ABNORMAL LOW (ref 4.0–10.5)
nRBC: 0 % (ref 0.0–0.2)

## 2021-08-25 LAB — MAGNESIUM: Magnesium: 2.1 mg/dL (ref 1.7–2.4)

## 2021-08-25 LAB — GLUCOSE, CAPILLARY
Glucose-Capillary: 93 mg/dL (ref 70–99)
Glucose-Capillary: 94 mg/dL (ref 70–99)
Glucose-Capillary: 105 mg/dL — ABNORMAL HIGH (ref 70–99)
Glucose-Capillary: 121 mg/dL — ABNORMAL HIGH (ref 70–99)
Glucose-Capillary: 109 mg/dL — ABNORMAL HIGH (ref 70–99)
Glucose-Capillary: 124 mg/dL — ABNORMAL HIGH (ref 70–99)

## 2021-08-25 LAB — PHOSPHORUS: Phosphorus: 3.1 mg/dL (ref 2.5–4.6)

## 2021-08-25 LAB — FIBRINOGEN: Fibrinogen: 507 mg/dL — ABNORMAL HIGH (ref 210–475)

## 2021-08-25 LAB — D-DIMER, QUANTITATIVE: D-Dimer, Quant: 0.67 ug/mL-FEU — ABNORMAL HIGH (ref 0.00–0.50)

## 2021-08-25 MED ORDER — FREE WATER
30.0000 mL | Status: DC
  Administered 2021-08-25 – 2021-08-29 (×25): 30 mL

## 2021-08-25 MED ORDER — DEXTROSE 5 % IV SOLN: INTRAVENOUS | Status: DC

## 2021-08-25 MED ORDER — DOCUSATE SODIUM 50 MG/5ML PO LIQD
50.0000 mg | Freq: Two times a day (BID) | ORAL | Status: DC
  Administered 2021-08-25 – 2021-08-29 (×9): 50 mg
  Filled 2021-08-25 (×9): qty 10

## 2021-08-25 MED ORDER — POLYETHYLENE GLYCOL 3350 17 G PO PACK: 17.0000 g | PACK | Freq: Every day | ORAL | Status: DC | PRN

## 2021-08-25 MED ORDER — VITAL AF 1.2 CAL PO LIQD
1000.0000 mL | ORAL | Status: DC
  Administered 2021-08-25 – 2021-08-26 (×2): 1000 mL

## 2021-08-25 MED ORDER — SODIUM CHLORIDE 0.9 % IV SOLN
1.0000 g | Freq: Once | INTRAVENOUS | Status: AC
  Administered 2021-08-25: 1 g via INTRAVENOUS
  Filled 2021-08-25: qty 10

## 2021-08-25 MED ORDER — POTASSIUM CHLORIDE 10 MEQ/50ML IV SOLN
10.0000 meq | INTRAVENOUS | Status: AC
  Administered 2021-08-25 (×4): 10 meq via INTRAVENOUS
  Filled 2021-08-25 (×4): qty 50

## 2021-08-25 MED ORDER — SODIUM CHLORIDE 0.9 % IV SOLN
2.0000 g | INTRAVENOUS | Status: AC
  Administered 2021-08-26 – 2021-08-27 (×2): 2 g via INTRAVENOUS
  Filled 2021-08-25 (×2): qty 20

## 2021-08-25 NOTE — Progress Notes (Addendum)
Initial Nutrition Assessment  DOCUMENTATION CODES:   Non-severe (moderate) malnutrition in context of chronic illness  INTERVENTION:   Vital 1.2@55ml /hr- Initiate at 53ml/hr, once tolerating advance by 73ml/hr q 12 hours until goal rate is reached.   Pro-Source 94ml TID via tube, provides 40kcal and 11g of protein per serving   Free water flushes 59ml q4 hours to maintain tube patency   Regimen provides 1704kcal/day, 132g/day protein and 1292ml/day of free water.   Pt at high refeed risk; recommend monitor potassium, magnesium and phosphorus labs daily until stable  Daily weights   NUTRITION DIAGNOSIS:   Moderate Malnutrition related to chronic illness (CHF, advanced age) as evidenced by moderate fat depletion, moderate muscle depletion, severe muscle depletion.  GOAL:   Provide needs based on ASPEN/SCCM guidelines  MONITOR:   Vent status, Labs, Weight trends, TF tolerance, Skin, I & O's  REASON FOR ASSESSMENT:   Ventilator, Malnutrition Screening Tool    ASSESSMENT:   86 y/o male with h/o CHF, HTN, DM, PAF, HLD and dementia who is admitted with septic shock, CAP, UTI, AKI and NSTEMI.  Pt sedated and ventilated. NGT in place to LIS with minimal output. Pt with abdominal pain pta. CT scan from 7/21 with concerns for SBO. Surgery consulted and felt imaging was more consistent with shock bowel vs true SBO. KUB from today reports normal bowel gas pattern. Pt does have a distended abdomen. Will initiate trickle tube feeds today and advance once pt is tolerating. Suspect pt with poor oral intake pta r/t acute illness. Pt is at high refeed risk. Per chart, pt is down 9lbs(6%) since April; RD unsure how recently weight loss occurred. Per chart, pt is up ~20lbs from his UBW. Pt +1.7L on his I & Os.   Medications reviewed and include: insulin, protonix, ceftriaxone, 5% dextrose @20ml /hr, doxycycline, Kcl, propofol, vasopressin    Labs reviewed: Na 129(L), K 3.5 wnl, BUN 54(H), P  3.1 wnl, Mg 2.1 wnl Wbc- 3.5(L), Hgb 7.6(L), Hct 22.9(L) Cbgs- 94, 93 x 24 hrs AIC 7.2(H)- 7/22  Patient is currently intubated on ventilator support MV: 11.0 L/min Temp (24hrs), Avg:97.1 F (36.2 C), Min:96.3 F (35.7 C), Max:97.6 F (36.4 C)  Propofol: 13.14 ml/hr- provides 346kcal/day   MAP- >4mmHg   UOP- 77m   NUTRITION - FOCUSED PHYSICAL EXAM:  Flowsheet Row Most Recent Value  Orbital Region Moderate depletion  Upper Arm Region Severe depletion  Thoracic and Lumbar Region Moderate depletion  Buccal Region Moderate depletion  Temple Region Moderate depletion  Clavicle Bone Region Severe depletion  Clavicle and Acromion Bone Region Severe depletion  Scapular Bone Region Moderate depletion  Dorsal Hand Unable to assess  Patellar Region Severe depletion  Anterior Thigh Region Severe depletion  Posterior Calf Region Severe depletion  Edema (RD Assessment) None  Hair Reviewed  Eyes Reviewed  Mouth Reviewed  Skin Reviewed  Nails Reviewed   Diet Order:   Diet Order             Diet NPO time specified  Diet effective now                  EDUCATION NEEDS:   No education needs have been identified at this time  Skin:  Skin Assessment: Reviewed RN Assessment (ecchymosis)  Last BM:  PTA  Height:   Ht Readings from Last 1 Encounters:  08/22/21 5\' 10"  (1.778 m)    Weight:   Wt Readings from Last 1 Encounters:  08/25/21 86.3 kg  Ideal Body Weight:  75.45 kg  BMI:  Body mass index is 27.3 kg/m.  Estimated Nutritional Needs:   Kcal:  1710kcal/day  Protein:  120-140g/day  Fluid:  1.9-2.2L/day  Betsey Holiday MS, RD, LDN Please refer to [facility] for RD and/or RD on-call/weekend/after hours pager

## 2021-08-25 NOTE — Progress Notes (Addendum)
Pharmacy Electrolyte Monitoring Consult:  Pharmacy consulted to assist in monitoring and replacing electrolytes in this 86 y.o. male admitted on 08/22/2021 with Respiratory Distress (Patient from home, dispatch called for SOB; Upon arrival, patient was 50% on RA and was immediately placed on CPAP (85% - 89% on CPAP); Received 1 Duoneb, Solu-Medrol 125 mg IV, and Mg 2 g IV en route)  Labs:  Sodium (mmol/L)  Date Value  08/25/2021 129 (L)   Potassium (mmol/L)  Date Value  08/25/2021 3.5   Magnesium (mg/dL)  Date Value  41/66/0630 2.1   Phosphorus (mg/dL)  Date Value  16/02/930 3.1   Calcium (mg/dL)  Date Value  35/57/3220 8.0 (L)   Albumin (g/dL)  Date Value  25/42/7062 3.2 (L)    Assessment/Plan: --Mild hyponatremia, defer management to PCCM --K 3.5, IV Kcl 10 mEq x 4 runs per PCCM --Follow-up electrolytes with AM labs tomorrow  Tressie Ellis 08/25/2021 7:40 AM

## 2021-08-25 NOTE — Progress Notes (Signed)
NAME:  Andrew [name], MRN:  [id number], DOB:  [birth year], LOS: 3 ADMISSION DATE:  08/22/2021, CONSULTATION DATE:  08/22/21 REFERRING MD:  Dr. Jacqualine Code, CHIEF COMPLAINT:  Shortness of Breath    CC FOLLOW UP RESP FAILURE History of Present Illness/SYNOPSIS  86 yo M presenting to [facility] ED from home via EMS with complaints of dyspnea, hypoxia & fatigue. History provided by the patient, and the patient's wife and daughter who are bedside. The patient was in his normal state of health until Thursday 08/21/21 when the patient developed lower abdominal pain and the "shakes". He was diagnosed with a UTI and started on Keflex. Then on 08/22/21 he took a nap and was awoken by his wife around 4:30 pm to take some medicine. At this time he woke up and was dyspneic, unable to complete a sentence, as well as severely fatigued unable to stand. EMS was called and upon their arrival his SpO2 was in the 50's, he was placed on CPAP improving to 85-89% and received 125 mg of Solumedrol, 2 g of Mg & 1 duo neb en route. Of note the patient also had an unwitnessed fall on 7/20, he has been taking all medication as prescribed including his Warfarin.  ED course: Upon arrival the patient was placed on the BIPAP for oxygen support, Sepsis protocol initiated. Due to respiratory status and concerns for fluid volume overload, only 1 L of LR bolus with improvement in color and perfusion per ED report. Lab work revealed severe lactic acidosis with mild leukocytosis and an elevated PCT, hyperglycemia, hyponatremia with pseudohyponatremia component, thrombocytopenia- though only slightly lower than baseline, AKI, elevated troponin- suspect from demand ischemia & respiratory acidosis Medications given: Cefepime & vancomycin, Duo neb, 1 L of LR Initial Vitals: 97.5, 18, 60, 105/49 & SpO2 93% on BIPAP Significant labs: (Labs/ Imaging personally reviewed) I, Domingo Pulse Rust[place]ster, AGACNP-BC, personally viewed and interpreted this ECG. EKG  Interpretation: Date: 08/22/21, EKG Time: 18:18, Rate: 60, Rhythm: A-fib with permanent pacemaker in place, QRS Axis: extreme axis, Intervals: prolonged Qtc in the setting of paced rhythm, ST/T Wave abnormalities: none, Narrative Interpretation: Atrial Fibrillation with permanent pacemaker Chemistry: Na+: 124, K+: 4.4, BUN/Cr.: 69/ 1.40, Serum CO2/ AG: 25/ 11, Glucose: 398 Hematology: WBC: 11.3, Hgb: 10.5, plt: 140  Troponin: 204 > 177, BNP: 97.9, Lactic/ PCT: 6.4 > 5.1/ 6.87, COVID-19 & Influenza A/B: negative VBG: 7.16/ 66/ 42/ 23.5 >> ABG: 7.25/ 64/ 92/ 23.7  CXR 08/22/21: Bilateral multifocal airspace opacities concerning for multifocal pneumonia vs pulmonary edema CT head wo contrast 08/22/21: pending CT abdomen/pelvis wo contrast 08/22/21: pending  PCCM consulted for admission due to acute hypoxic respiratory failure at [facility] for intubation due to increased work of breathing.  Pertinent  Medical History  T2DM PAF on Coumadin HFpEF HLD HTN  Significant Hospital Events: Including procedures, antibiotic start and stop dates in addition to other pertinent events   08/22/21: Admit to ICU with acute hypoxic respiratory failure on BIPAP with HIGH RISK for INTUBATION due to increased WOB. 7/22 emergently intubated 08/24/21: Patient was made DNR by the family surgery saw him for the small bowel obstruction he is not a surgical candidate he is slowly coming off pressors and his lactic acid normalized. 7/24 remains on vent  Interim History / Subjective:  Remains critically ill On vent +SBO     Objective   Blood pressure (!) 98/57, pulse (!) 59, temperature (!) 96.3 F (35.7 C), temperature source Axillary, resp. rate 18, height 5'  10" (1.778 m), weight 86.3 kg, SpO2 100 %.    Vent Mode: PRVC FiO2 (%):  [30 %-35 %] 30 % Set Rate:  [18 bmp] 18 bmp Vt Set:  [500 mL] 500 mL PEEP:  [5 cmH20] 5 cmH20 Plateau Pressure:  [17 cmH20-20 cmH20] 20 cmH20   Intake/Output Summary (Last 24  hours) at 08/25/2021 0719 Last data filed at 08/25/2021 8676 Gross per 24 hour  Intake 1504.64 ml  Output 2285 ml  Net -780.36 ml    Filed Weights   08/23/21 0424 08/24/21 0311 08/25/21 0411  Weight: 73 kg 85 kg 86.3 kg    REVIEW OF SYSTEMS  PATIENT IS UNABLE TO PROVIDE COMPLETE REVIEW OF SYSTEMS DUE TO SEVERE CRITICAL ILLNESS    PHYSICAL EXAMINATION:  GENERAL:critically ill appearing, +resp distress EYES: Pupils equal, round, reactive to light.  No scleral icterus.  MOUTH: Moist mucosal membrane. INTUBATED NECK: Supple.  PULMONARY: +rhonchi, +wheezing CARDIOVASCULAR: S1 and S2.  No murmurs  GASTROINTESTINAL: Soft, nontender, -distended. Positive bowel sounds.  MUSCULOSKELETAL: No swelling, clubbing, or edema.  NEUROLOGIC: obtunded SKIN:intact,warm,dry    Resolved Hospital Problem list     Assessment & Plan:  86 yo white male admitted to ICU for Acute Hypoxic/ Hypercapnic Respiratory Failure secondary to CAP in the setting of suspected pulmonary edema SBO aspiration pneumonitis  Severe ACUTE Hypoxic and Hypercapnic Respiratory Failure -continue Mechanical Ventilator support -Wean Fio2 and PEEP as tolerated -VAP/VENT bundle implementation - Wean PEEP & FiO2 as tolerated, maintain SpO2 > 88% - Head of bed elevated 30 degrees, VAP protocol in place - Plateau pressures less than 30 cm H20  - Intermittent chest x-ray & ABG PRN - Ensure adequate pulmonary hygiene  -will perform SAT/SBT when respiratory parameters are met  SEPSIS SOURCE-CAP,aspiration pneumonia,SBO -use vasopressors to keep MAP>65 as needed -follow ABG and LA as needed -follow up cultures -emperic ABX -consider stress dose steroids -aggressive IV fluid Resuscitation   GI +SBO NG to suction  ACUTE SYSTOLIC CARDIAC FAILURE- Acute on Chronic HFrEF exacerbation Elevated Troponin secondary to demand ischemia -oxygen as needed -Lasix as tolerated -follow up cardiac enzymes as indicated ECHO  PENDING  ACUTE KIDNEY INJURY/Renal Failure -continue Foley Catheter-assess need -Avoid nephrotoxic agents -Follow urine output, BMP -Ensure adequate renal perfusion, optimize oxygenation -Renal dose medications   Intake/Output Summary (Last 24 hours) at 08/25/2021 0724 Last data filed at 08/25/2021 7209 Gross per 24 hour  Intake 1504.64 ml  Output 2285 ml  Net -780.36 ml     Acute Hyponatremia secondary to unknown etiology in the setting of slight pseudohyponatremia due to hyperglycemia Follow lytes   ENDO - ICU hypoglycemic\Hyperglycemia protocol -check FSBS per protocol   GI GI PROPHYLAXIS as indicated  NUTRITIONAL STATUS DIET-->NPO Constipation protocol as indicated   ELECTROLYTES -follow labs as needed -replace as needed -pharmacy consultation and following   Best Practice (right click and "Reselect all SmartList Selections" daily)  Diet/type: NPO w/ oral meds as respiratory status allows DVT prophylaxis: DOAC GI prophylaxis: PPI Lines: N/A Foley:  Yes, and it is still needed Code Status:  full code Last date of multidisciplinary goals of care discussion [08/22/21]  Labs   CBC: Recent Labs  Lab 08/22/21 1821 08/23/21 0444 08/24/21 0308 08/25/21 0237  WBC 11.3* 8.8 6.2 3.5*  NEUTROABS 9.9*  --   --  2.7  HGB 10.5* 9.0* 8.0* 7.6*  HCT 32.3* 27.4* 24.3* 22.9*  MCV 96.4 92.9 92.4 91.6  PLT 140* 136* 93* 74*     Basic  Metabolic Panel: Recent Labs  Lab 08/22/21 1821 08/22/21 2106 08/23/21 0145 08/23/21 0444 08/23/21 1125 08/23/21 1821 08/24/21 0308 08/25/21 0237  NA 124*  --    < > 125* 127* 127* 125* 129*  K 4.4  --   --  4.3  --   --  4.0 3.5  CL 88*  --   --  91*  --   --  92* 95*  CO2 25  --   --  27  --   --  26 26  GLUCOSE 398*  --   --  229*  --   --  183* 61*  BUN 69*  --   --  76*  --   --  67* 54*  CREATININE 1.40*  --   --  1.30*  --   --  1.19 0.95  CALCIUM 8.5*  --   --  8.1*  --   --  8.0* 8.0*  MG 2.8* 2.4  --  2.2  --    --  2.2 2.1  PHOS  --  4.8*  --  4.5  --   --  3.7 3.1   < > = values in this interval not displayed.    GFR: Estimated Creatinine Clearance: 59.8 mL/min (by C-G formula based on SCr of 0.95 mg/dL). Recent Labs  Lab 08/22/21 1821 08/22/21 1822 08/22/21 1825 08/22/21 1835 08/23/21 0444 08/23/21 0509 08/23/21 1125 08/24/21 0308 08/25/21 0237  PROCALCITON  --   --   --  6.87 7.50  --   --  4.68 2.95  WBC 11.3*  --   --   --  8.8  --   --  6.2 3.5*  LATICACIDVEN  --  6.4* 5.1*  --   --  2.3* 1.6  --   --      Liver Function Tests: Recent Labs  Lab 08/22/21 2106  AST 36  ALT 16  ALKPHOS 50  BILITOT 1.0  PROT 5.8*  ALBUMIN 3.2*    No results for input(s): "LIPASE", "AMYLASE" in the last 168 hours. No results for input(s): "AMMONIA" in the last 168 hours.  ABG    Component Value Date/Time   PHART 7.33 (L) 08/23/2021 0130   PCO2ART 48 08/23/2021 0130   PO2ART 299 (H) 08/23/2021 0130   HCO3 25.3 08/23/2021 0130   ACIDBASEDEF 0.9 08/23/2021 0130   O2SAT 99.9 08/23/2021 0130     Coagulation Profile: Recent Labs  Lab 08/23/21 0145 08/24/21 0921 08/25/21 0237  INR 5.3* 4.9* 4.1*     Cardiac Enzymes: No results for input(s): "CKTOTAL", "CKMB", "CKMBINDEX", "TROPONINI" in the last 168 hours.  HbA1C: Hgb A1c MFr Bld  Date/Time Value Ref Range Status  08/23/2021 01:45 AM 7.2 (H) 4.8 - 5.6 % Final    Comment:    (NOTE) Pre diabetes:          5.7%-6.4%  Diabetes:              >6.4%  Glycemic control for   <7.0% adults with diabetes   05/19/2021 09:23 PM 8.0 (H) 4.8 - 5.6 % Final    Comment:    (NOTE) Pre diabetes:          5.7%-6.4%  Diabetes:              >6.4%  Glycemic control for   <7.0% adults with diabetes     CBG: Recent Labs  Lab 08/24/21 1136 08/24/21 1557 08/24/21 1951 08/24/21 2356 08/25/21 0331  GLUCAP 146* 131* 84 74 93    Allergies Allergies  Allergen Reactions   Methyldopa Nausea Only and Palpitations    Other  reaction(s): Unknown Other reaction(s): NAUSEA 'palpitation" Other reaction(s): NAUSEA Other reaction(s): NAUSEA      Home Medications  Prior to Admission medications   Medication Sig Start Date End Date Taking? Authorizing Provider  amLODipine (NORVASC) 2.5 MG tablet Take 2.5 mg by mouth daily. 05/02/21   [provider]  donepezil (ARICEPT) 10 MG tablet Take 10 mg by mouth at bedtime. 03/14/21   [provider]  doxazosin (CARDURA) 4 MG tablet Take 4 mg by mouth Nightly. 12/31/16   [provider]  furosemide (LASIX) 80 MG tablet Take 80 mg by mouth daily.    [provider]  glimepiride (AMARYL) 2 MG tablet Take 4 mg by mouth daily. 08/14/16   [provider]  guaiFENesin-dextromethorphan (ROBITUSSIN DM) 100-10 MG/5ML syrup Take 5 mLs by mouth every 4 (four) hours as needed for cough. 05/21/21   Annita Brod, MD  lisinopril (ZESTRIL) 20 MG tablet Take 20 mg by mouth daily. 05/12/21   [provider]  memantine (NAMENDA) 10 MG tablet Take 10 mg by mouth daily. 05/18/21   [provider]  metFORMIN (GLUCOPHAGE) 1000 MG tablet Take 1,000 mg by mouth 2 (two) times daily.    [provider]  pioglitazone (ACTOS) 15 MG tablet Take 15 mg by mouth daily.     [provider]  Potassium 99 MG TABS Take 1.5 tablets by mouth daily.    [provider]  simvastatin (ZOCOR) 20 MG tablet Take 20 mg by mouth Nightly. 12/31/16   [provider]  vitamin B-12 (CYANOCOBALAMIN) 1000 MCG tablet Take 1,000 mcg by mouth daily.    [provider]  warfarin (COUMADIN) 2.5 MG tablet Take 1.25-2.5 mg by mouth as directed. Take 1/2 tablet (1.25 mg) on Monday, Wednesday and Friday. Take 1 tablet (2.5 mg) on Sunday, Tuesday, Thursday and Saturday. 01/17/21   [provider]       DVT/GI PRX  assessed I Assessed the need for Labs I Assessed the need for Foley I Assessed the need for Central Venous  Line Family Discussion when available I Assessed the need for Mobilization I made an Assessment of medications to be adjusted accordingly Safety Risk assessment completed  CASE DISCUSSED IN MULTIDISCIPLINARY ROUNDS WITH ICU TEAM     Critical Care Time devoted to patient care services described in this note is 55 minutes.  Critical care was necessary to treat /prevent imminent and life-threatening deterioration. Overall, patient is critically ill, prognosis is guarded.  Patient with Multiorgan failure and at high risk for cardiac arrest and death.    Corrin Parker, M.D.  [facility]  Medical Director [place] Director [facility] Cardio-Pulmonary Department

## 2021-08-25 NOTE — IPAL (Signed)
  Interdisciplinary Goals of Care Family Meeting   Date carried out: 08/25/2021  Location of the meeting: Bedside  Member's involved: Physician and Family Member or next of kin      GOALS OF CARE DISCUSSION  The Clinical status was relayed to family in detail-Daughter and Wife at bedside  Updated and notified of patients medical condition- Explained to family course of therapy and the modalities   Patient with Progressive multiorgan failure with a very high probablity of a very minimal chance of meaningful recovery despite all aggressive and optimal medical therapy.  PATIENT REMAINS DNR status  Patient with distended ABD Will plan to place NG tube and start Trickle feeds and see how patient tolerates enteral nutrition before we plan to extubate  I have discussed with family regarding one way extubation versus re-intubation and they need to decide this before trial of extubation occurs  Family understands the situation.   Family are satisfied with Plan of action and management. All questions answered  Additional CC time 28 mins   Sharice Harriss Santiago Glad, M.D.  [facility]  Medical Director [facility] Medical Director [facility] Cardio-Pulmonary Department

## 2021-08-26 DIAGNOSIS — A419 Sepsis, unspecified organism: Secondary | ICD-10-CM | POA: Diagnosis not present

## 2021-08-26 DIAGNOSIS — R6520 Severe sepsis without septic shock: Secondary | ICD-10-CM | POA: Diagnosis not present

## 2021-08-26 DIAGNOSIS — R579 Shock, unspecified: Secondary | ICD-10-CM | POA: Diagnosis not present

## 2021-08-26 DIAGNOSIS — J189 Pneumonia, unspecified organism: Secondary | ICD-10-CM | POA: Diagnosis not present

## 2021-08-26 LAB — BASIC METABOLIC PANEL
Anion gap: 7 (ref 5–15)
BUN: 64 mg/dL — ABNORMAL HIGH (ref 8–23)
CO2: 24 mmol/L (ref 22–32)
Calcium: 8.1 mg/dL — ABNORMAL LOW (ref 8.9–10.3)
Chloride: 96 mmol/L — ABNORMAL LOW (ref 98–111)
Creatinine, Ser: 1.03 mg/dL (ref 0.61–1.24)
GFR, Estimated: >60 mL/min (ref >60)
Glucose, Bld: 152 mg/dL — ABNORMAL HIGH (ref 70–99)
Potassium: 4.2 mmol/L (ref 3.5–5.1)
Sodium: 127 mmol/L — ABNORMAL LOW (ref 135–145)

## 2021-08-26 LAB — GLUCOSE, CAPILLARY
Glucose-Capillary: 134 mg/dL — ABNORMAL HIGH (ref 70–99)
Glucose-Capillary: 141 mg/dL — ABNORMAL HIGH (ref 70–99)
Glucose-Capillary: 151 mg/dL — ABNORMAL HIGH (ref 70–99)
Glucose-Capillary: 159 mg/dL — ABNORMAL HIGH (ref 70–99)
Glucose-Capillary: 162 mg/dL — ABNORMAL HIGH (ref 70–99)
Glucose-Capillary: 173 mg/dL — ABNORMAL HIGH (ref 70–99)

## 2021-08-26 LAB — PROTIME-INR
INR: 3.3 — ABNORMAL HIGH (ref 0.8–1.2)
Prothrombin Time: 33.3 seconds — ABNORMAL HIGH (ref 11.4–15.2)

## 2021-08-26 LAB — PHOSPHORUS: Phosphorus: 3.6 mg/dL (ref 2.5–4.6)

## 2021-08-26 LAB — MAGNESIUM: Magnesium: 2 mg/dL (ref 1.7–2.4)

## 2021-08-26 MED ORDER — MIDAZOLAM HCL 2 MG/2ML IJ SOLN
1.0000 mg | INTRAMUSCULAR | Status: DC | PRN
  Administered 2021-08-26 – 2021-08-28 (×2): 1 mg via INTRAVENOUS
  Filled 2021-08-26 (×2): qty 2

## 2021-08-26 MED ORDER — FENTANYL CITRATE PF 50 MCG/ML IJ SOSY: 25.0000 ug | PREFILLED_SYRINGE | INTRAMUSCULAR | Status: DC | PRN

## 2021-08-26 MED ORDER — PROPOFOL 1000 MG/100ML IV EMUL
INTRAVENOUS | Status: AC
  Administered 2021-08-26: 5 ug/kg/min via INTRAVENOUS
  Filled 2021-08-26: qty 100

## 2021-08-26 MED ORDER — MIDAZOLAM HCL 2 MG/2ML IJ SOLN
1.0000 mg | INTRAMUSCULAR | Status: DC | PRN
  Administered 2021-08-26: 1 mg via INTRAVENOUS
  Filled 2021-08-26: qty 2

## 2021-08-26 MED ORDER — FUROSEMIDE 10 MG/ML IJ SOLN
40.0000 mg | Freq: Once | INTRAMUSCULAR | Status: AC
  Administered 2021-08-26: 40 mg via INTRAVENOUS
  Filled 2021-08-26: qty 4

## 2021-08-26 MED ORDER — POLYETHYLENE GLYCOL 3350 17 G PO PACK
17.0000 g | PACK | Freq: Every day | ORAL | Status: DC
Start: 2021-08-26 — End: 2021-08-29
  Administered 2021-08-26 – 2021-08-29 (×4): 17 g
  Filled 2021-08-26 (×4): qty 1

## 2021-08-26 MED ORDER — PROPOFOL 1000 MG/100ML IV EMUL
5.0000 ug/kg/min | INTRAVENOUS | Status: DC
  Administered 2021-08-27: 20 ug/kg/min via INTRAVENOUS
  Administered 2021-08-27: 30 ug/kg/min via INTRAVENOUS
  Administered 2021-08-27 – 2021-08-28 (×2): 20 ug/kg/min via INTRAVENOUS
  Filled 2021-08-26 (×2): qty 100
  Filled 2021-08-26: qty 200

## 2021-08-26 MED ORDER — FENTANYL CITRATE PF 50 MCG/ML IJ SOSY
25.0000 ug | PREFILLED_SYRINGE | INTRAMUSCULAR | Status: DC | PRN
  Administered 2021-08-26: 50 ug via INTRAVENOUS
  Administered 2021-08-28: 100 ug via INTRAVENOUS
  Filled 2021-08-26: qty 1
  Filled 2021-08-26: qty 2

## 2021-08-26 NOTE — Progress Notes (Signed)
Pharmacy Electrolyte Monitoring Consult:  Pharmacy consulted to assist in monitoring and replacing electrolytes in this 86 y.o. male admitted on 08/22/2021 with Respiratory Distress (Patient from home, dispatch called for SOB; Upon arrival, patient was 50% on RA and was immediately placed on CPAP (85% - 89% on CPAP); Received 1 Duoneb, Solu-Medrol 125 mg IV, and Mg 2 g IV en route)  Labs:  Sodium (mmol/L)  Date Value  08/26/2021 127 (L)   Potassium (mmol/L)  Date Value  08/26/2021 4.2   Magnesium (mg/dL)  Date Value  97/67/3419 2.0   Phosphorus (mg/dL)  Date Value  37/90/2409 3.6   Calcium (mg/dL)  Date Value  73/53/2992 8.1 (L)   Albumin (g/dL)  Date Value  42/68/3419 3.2 (L)    Assessment/Plan: --Worsened hyponatremia, defer management to PCCM --Follow-up electrolytes with AM labs tomorrow  Tressie Ellis 08/26/2021 7:48 AM

## 2021-08-26 NOTE — Progress Notes (Signed)
NAME:  Andrew [name], MRN:  [id number], DOB:  1935/08/31, LOS: 4 ADMISSION DATE:  08/22/2021, CONSULTATION DATE:  08/22/21 REFERRING MD:  Dr. Jacqualine Code, CHIEF COMPLAINT:  Shortness of Breath   Brief Patient Description / Synopsis:  86 yo white male admitted to ICU for Acute Hypoxic/ Hypercapnic Respiratory Failure secondary to CAP and aspiration pneumonitis, suspected pulmonary edema, and SBO requiring mechanical ventilation.   History of Present Illness:  86 yo M presenting to [facility] ED from home via EMS with complaints of dyspnea, hypoxia & fatigue. History provided by the patient, and the patient's wife and daughter who are bedside. The patient was in his normal state of health until Thursday 08/21/21 when the patient developed lower abdominal pain and the "shakes". He was diagnosed with a UTI and started on Keflex. Then on 08/22/21 he took a nap and was awoken by his wife around 4:30 pm to take some medicine. At this time he woke up and was dyspneic, unable to complete a sentence, as well as severely fatigued unable to stand. EMS was called and upon their arrival his SpO2 was in the 50's, he was placed on CPAP improving to 85-89% and received 125 mg of Solumedrol, 2 g of Mg & 1 duo neb en route. Of note the patient also had an unwitnessed fall on 7/20, he has been taking all medication as prescribed including his Warfarin.  ED course: Upon arrival the patient was placed on the BIPAP for oxygen support, Sepsis protocol initiated. Due to respiratory status and concerns for fluid volume overload, only 1 L of LR bolus with improvement in color and perfusion per ED report. Lab work revealed severe lactic acidosis with mild leukocytosis and an elevated PCT, hyperglycemia, hyponatremia with pseudohyponatremia component, thrombocytopenia- though only slightly lower than baseline, AKI, elevated troponin- suspect from demand ischemia & respiratory acidosis Medications given: Cefepime & vancomycin, Duo neb, 1 L of  LR Initial Vitals: 97.5, 18, 60, 105/49 & SpO2 93% on BIPAP Significant labs: (Labs/ Imaging personally reviewed) I, Domingo Pulse [place], AGACNP-BC, personally viewed and interpreted this ECG. EKG Interpretation: Date: 08/22/21, EKG Time: 18:18, Rate: 60, Rhythm: A-fib with permanent pacemaker in place, QRS Axis: extreme axis, Intervals: prolonged Qtc in the setting of paced rhythm, ST/T Wave abnormalities: none, Narrative Interpretation: Atrial Fibrillation with permanent pacemaker Chemistry: Na+: 124, K+: 4.4, BUN/Cr.: 69/ 1.40, Serum CO2/ AG: 25/ 11, Glucose: 398 Hematology: WBC: 11.3, Hgb: 10.5, plt: 140  Troponin: 204 > 177, BNP: 97.9, Lactic/ PCT: 6.4 > 5.1/ 6.87, COVID-19 & Influenza A/B: negative VBG: 7.16/ 66/ 42/ 23.5 >> ABG: 7.25/ 64/ 92/ 23.7  CXR 08/22/21: Bilateral multifocal airspace opacities concerning for multifocal pneumonia vs pulmonary edema CT head wo contrast 08/22/21: pending CT abdomen/pelvis wo contrast 08/22/21: pending  PCCM consulted for admission due to acute hypoxic respiratory failure at [place] for intubation due to increased work of breathing.  Pertinent  Medical History  T2DM PAF on Coumadin HFpEF HLD HTN  MICRO Data:  7/21: SARS-CoV-2 & Influenza PCR>>negative 7/21: Blood culture x2>>NGTD 7/22: MRSA PCR>> negative  Antimicrobials:  Cefepime 7/21 x1 dose Vancomycin 7/21 x1 dose Ceftriaxone 7/22>> Doxycycline 7/22>>  Significant Hospital Events: Including procedures, antibiotic start and stop dates in addition to other pertinent events   08/22/21: Admit to ICU with acute hypoxic respiratory failure on BIPAP with HIGH RISK for INTUBATION due to increased WOB. 08/24/21: Patient was made DNR by the family surgery saw him for the small bowel obstruction she is not  a surgical candidate he is slowly coming off pressors and his lactic acid normalized. 7/24 remains on vent.  Trickle feeds started 7/25: Tolerating trickle feeds. Plan for SBT.  Diurese  with Lasix 40 mg x1 dose  Interim History / Subjective:  -No acute events noted overnight -Afebrile, hemodynamically stable, no vasopressors -Tolerated trickle feeds ~ no vomiting/high residuals reported, no change in abdominal exam (remains slightly taught and distended) -On minimal vent support, plan for SBT once family at bedside -Will diurese with Lasix 40 mg x1 dose  Objective   Blood pressure (!) 127/59, pulse 60, temperature 98 F (36.7 C), temperature source Axillary, resp. rate 17, height 5' 10"  (1.778 m), weight 86.5 kg, SpO2 99 %.    Vent Mode: PRVC FiO2 (%):  [30 %-35 %] 35 % Set Rate:  [18 bmp] 18 bmp Vt Set:  [500 mL] 500 mL PEEP:  [5 cmH20] 5 cmH20 Pressure Support:  [8 cmH20] 8 cmH20 Plateau Pressure:  [18 cmH20] 18 cmH20   Intake/Output Summary (Last 24 hours) at 08/26/2021 0747 Last data filed at 08/26/2021 0355 Gross per 24 hour  Intake 1741.1 ml  Output 1250 ml  Net 491.1 ml    Filed Weights   08/24/21 0311 08/25/21 0411 08/26/21 0500  Weight: 85 kg 86.3 kg 86.5 kg    Examination: General: Acute on chronically ill-appearing male, laying in bed, intubated and sedated, no acute distress HEENT: MM pink/moist, anicteric, atraumatic, neck supple, orally intubated Neuro: Sedated, currently not following commands, nursing reports when sedation lightened becomes anxious/agitated, pupils PERRLA CV: s1s2 RRR, controlled A-fib with pacemaker on monitor, no r/m/g Pulm: Coarse breath sounds bilaterally, occasionally overbreathing the ventilator, even, nonlabored GI: soft, slightly distended, slightly tender lower abdomen, bs x 4 Skin: scattered ecchymosis, including LUE Extremities: warm/dry, pulses + 2 R/P, +1 edema noted BLE  Resolved Hospital Problem list     Assessment & Plan:   Acute Hypoxic/ Hypercapnic Respiratory Failure secondary to CAP & aspiration pneumonitis, and suspected pulmonary edema  -Full vent support, implement lung protective  strategies -Plateau pressures less than 30 cm H20 -Wean FiO2 & PEEP as tolerated to maintain O2 sats >92% -Follow intermittent Chest X-ray & ABG as needed -Spontaneous Breathing Trials when respiratory parameters met and mental status permits -Implement VAP Bundle -Prn Bronchodilators -ABX as above -Diuresis as BP & renal function permits ~give 40 mg IV Lasix x1 dose on 7/25  Suspected Severe Sepsis without septic shock due to suspected CAP & SBO -Monitor fever curve -Trend WBC's & Procalcitonin -Follow cultures as above -Continue empiric Ceftriaxone & Doxycycline pending cultures & sensitivities  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 Elevated Troponin secondary to demand ischemia Chronic Atrial Fibrillation Moderate to severe Aortic Stenosis PMHx: HFpEF, permanent pacemaker, HTN, HLD ECHO 08/24/21: LVEF 25-30%, grade II DD, RV normal, moderate to severe aortic stenosis -Continuous cardiac monitoring -Maintain MAP >65 -Vasopressors as needed to maintain MAP goal -Lactic acid has normalized (6.4 ~ 5.1 ~ 2.3 ~ 1.6) -HS Troponin peaked at 204 -Diuresis as BP and renal function permits ~give 40 mg IV Lasix x1 dose 7/25  Small Bowel Obstruction -NPO -NGT to LIS ~ trickle feeds started 7/24, currently tolerating -Follow serial abdominal exam -Evaluated by General Surgery ~ felt not to be surgical candidate due to critical illness and multiorgan failure ~ recommended NGT and medical management  Acute Kidney Injury in the setting of suspected sepsis Acute Hyponatremia secondary to unknown etiology in the setting of slight pseudohyponatremia due  to hyperglycemia -Monitor I&O's / urinary output -Follow BMP -Ensure adequate renal perfusion -Avoid nephrotoxic agents as able -Replace electrolytes as indicated  Type 2 Diabetes Mellitus Steroid Induced Hyperglycemia Hemoglobin A1C: pending - Monitor CBG Q 4 hours - SSI resistant dosing - target range while in ICU:  140-180 - follow ICU hyper/hypo-glycemia protocol  Sedation needs in setting of mechanical ventilation Dementia - early stages -Maintain a RASS goal of 0 to -1 -Fentanyl and Propofol as needed to maintain RASS goal -Avoid sedating medications as able -Daily wake up assessment   Best Practice (right click and "Reselect all SmartList Selections" daily)  Diet/type: NPO, trickle feeds DVT prophylaxis: DOAC (on hold due to supra therapeutic INR and coags) GI prophylaxis: PPI Lines: Left IJ CVC, and is still needed Foley:  Yes, and it is still needed Code Status:  DNR Last date of multidisciplinary goals of care discussion [08/26/21]  Pt's wife and son updated at bedside 7/25.  Labs   CBC: Recent Labs  Lab 08/22/21 1821 08/23/21 0444 08/24/21 0308 08/25/21 0237  WBC 11.3* 8.8 6.2 3.5*  NEUTROABS 9.9*  --   --  2.7  HGB 10.5* 9.0* 8.0* 7.6*  HCT 32.3* 27.4* 24.3* 22.9*  MCV 96.4 92.9 92.4 91.6  PLT 140* 136* 93* 74*     Basic Metabolic Panel: Recent Labs  Lab 08/22/21 1821 08/22/21 2106 08/23/21 0145 08/23/21 0444 08/23/21 1125 08/23/21 1821 08/24/21 0308 08/25/21 0237 08/26/21 0415  NA 124*  --    < > 125* 127* 127* 125* 129* 127*  K 4.4  --   --  4.3  --   --  4.0 3.5 4.2  CL 88*  --   --  91*  --   --  92* 95* 96*  CO2 25  --   --  27  --   --  26 26 24   GLUCOSE 398*  --   --  229*  --   --  183* 61* 152*  BUN 69*  --   --  76*  --   --  67* 54* 64*  CREATININE 1.40*  --   --  1.30*  --   --  1.19 0.95 1.03  CALCIUM 8.5*  --   --  8.1*  --   --  8.0* 8.0* 8.1*  MG 2.8* 2.4  --  2.2  --   --  2.2 2.1 2.0  PHOS  --  4.8*  --  4.5  --   --  3.7 3.1 3.6   < > = values in this interval not displayed.    GFR: Estimated Creatinine Clearance: 55.1 mL/min (by C-G formula based on SCr of 1.03 mg/dL). Recent Labs  Lab 08/22/21 1821 08/22/21 1822 08/22/21 1825 08/22/21 1835 08/23/21 0444 08/23/21 0509 08/23/21 1125 08/24/21 0308 08/25/21 0237  PROCALCITON   --   --   --  6.87 7.50  --   --  4.68 2.95  WBC 11.3*  --   --   --  8.8  --   --  6.2 3.5*  LATICACIDVEN  --  6.4* 5.1*  --   --  2.3* 1.6  --   --      Liver Function Tests: Recent Labs  Lab 08/22/21 2106  AST 36  ALT 16  ALKPHOS 50  BILITOT 1.0  PROT 5.8*  ALBUMIN 3.2*    No results for input(s): "LIPASE", "AMYLASE" in the last 168 hours. No results for input(s): "AMMONIA"  in the last 168 hours.  ABG    Component Value Date/Time   PHART 7.33 (L) 08/23/2021 0130   PCO2ART 48 08/23/2021 0130   PO2ART 299 (H) 08/23/2021 0130   HCO3 25.3 08/23/2021 0130   ACIDBASEDEF 0.9 08/23/2021 0130   O2SAT 99.9 08/23/2021 0130     Coagulation Profile: Recent Labs  Lab 08/23/21 0145 08/24/21 0921 08/25/21 0237 08/26/21 0415  INR 5.3* 4.9* 4.1* 3.3*     Cardiac Enzymes: No results for input(s): "CKTOTAL", "CKMB", "CKMBINDEX", "TROPONINI" in the last 168 hours.  HbA1C: Hgb A1c MFr Bld  Date/Time Value Ref Range Status  08/23/2021 01:45 AM 7.2 (H) 4.8 - 5.6 % Final    Comment:    (NOTE) Pre diabetes:          5.7%-6.4%  Diabetes:              >6.4%  Glycemic control for   <7.0% adults with diabetes   05/19/2021 09:23 PM 8.0 (H) 4.8 - 5.6 % Final    Comment:    (NOTE) Pre diabetes:          5.7%-6.4%  Diabetes:              >6.4%  Glycemic control for   <7.0% adults with diabetes     CBG: Recent Labs  Lab 08/25/21 1613 08/25/21 1904 08/25/21 2245 08/26/21 0419 08/26/21 0733  GLUCAP 121* 109* 124* 151* 162*      Past Medical History:  He,  has a past medical history of CHF (congestive heart failure) ([place]), Diabetes mellitus without complication ([place]), Essential hypertension, Mixed hyperlipidemia, and Permanent atrial fibrillation ([place]).   Surgical History:   Past Surgical History:  Procedure Laterality Date   APPENDECTOMY     INSERT / REPLACE / REMOVE PACEMAKER     PACEMAKER IMPLANT N/A 03/25/2018   Procedure: PACEMAKER IMPLANT;  Surgeon:  Thompson Grayer, MD;  Location: [facility];  Service: Cardiovascular;  Laterality: N/A;     Social History:   reports that he has quit smoking. His smoking use included cigarettes. He has a 52.50 pack-year smoking history. He has never used smokeless tobacco. He reports that he does not currently use alcohol. He reports that he does not use drugs.   Family History:  His family history includes Bladder Cancer in his father; CVA in his mother.   Allergies Allergies  Allergen Reactions   Methyldopa Nausea Only and Palpitations    Other reaction(s): Unknown Other reaction(s): NAUSEA 'palpitation" Other reaction(s): NAUSEA Other reaction(s): NAUSEA      Home Medications  Prior to Admission medications   Medication Sig Start Date End Date Taking? Authorizing Provider  amLODipine (NORVASC) 2.5 MG tablet Take 2.5 mg by mouth daily. 05/02/21   [provider]  donepezil (ARICEPT) 10 MG tablet Take 10 mg by mouth at bedtime. 03/14/21   [provider]  doxazosin (CARDURA) 4 MG tablet Take 4 mg by mouth Nightly. 12/31/16   [provider]  furosemide (LASIX) 80 MG tablet Take 80 mg by mouth daily.    [provider]  glimepiride (AMARYL) 2 MG tablet Take 4 mg by mouth daily. 08/14/16   [provider]  guaiFENesin-dextromethorphan (ROBITUSSIN DM) 100-10 MG/5ML syrup Take 5 mLs by mouth every 4 (four) hours as needed for cough. 05/21/21   Annita Brod, MD  lisinopril (ZESTRIL) 20 MG tablet Take 20 mg by mouth daily. 05/12/21   [provider]  memantine (NAMENDA) 10  MG tablet Take 10 mg by mouth daily. 05/18/21   [provider]  metFORMIN (GLUCOPHAGE) 1000 MG tablet Take 1,000 mg by mouth 2 (two) times daily.    [provider]  pioglitazone (ACTOS) 15 MG tablet Take 15 mg by mouth daily.     [provider]  Potassium 99 MG TABS Take 1.5 tablets by mouth daily.    [provider]  simvastatin  (ZOCOR) 20 MG tablet Take 20 mg by mouth Nightly. 12/31/16   [provider]  vitamin B-12 (CYANOCOBALAMIN) 1000 MCG tablet Take 1,000 mcg by mouth daily.    [provider]  warfarin (COUMADIN) 2.5 MG tablet Take 1.25-2.5 mg by mouth as directed. Take 1/2 tablet (1.25 mg) on Monday, Wednesday and Friday. Take 1 tablet (2.5 mg) on Sunday, Tuesday, Thursday and Saturday. 01/17/21   [provider]      Critical Care Time: 40 minutes   Darel Hong, AGACNP-BC [facility] Prefer epic messenger for cross cover needs If after hours, please call E-link

## 2021-08-27 ENCOUNTER — Inpatient Hospital Stay: Payer: Medicare HMO

## 2021-08-27 DIAGNOSIS — R6520 Severe sepsis without septic shock: Secondary | ICD-10-CM | POA: Diagnosis not present

## 2021-08-27 DIAGNOSIS — A419 Sepsis, unspecified organism: Secondary | ICD-10-CM | POA: Diagnosis not present

## 2021-08-27 DIAGNOSIS — J189 Pneumonia, unspecified organism: Secondary | ICD-10-CM | POA: Diagnosis not present

## 2021-08-27 DIAGNOSIS — R579 Shock, unspecified: Secondary | ICD-10-CM | POA: Diagnosis not present

## 2021-08-27 LAB — GLUCOSE, CAPILLARY
Glucose-Capillary: 91 mg/dL (ref 70–99)
Glucose-Capillary: 162 mg/dL — ABNORMAL HIGH (ref 70–99)
Glucose-Capillary: 161 mg/dL — ABNORMAL HIGH (ref 70–99)
Glucose-Capillary: 190 mg/dL — ABNORMAL HIGH (ref 70–99)

## 2021-08-27 LAB — BASIC METABOLIC PANEL
Anion gap: 7 (ref 5–15)
BUN: 68 mg/dL — ABNORMAL HIGH (ref 8–23)
CO2: 24 mmol/L (ref 22–32)
Calcium: 7.9 mg/dL — ABNORMAL LOW (ref 8.9–10.3)
Chloride: 106 mmol/L (ref 98–111)
Creatinine, Ser: 1.11 mg/dL (ref 0.61–1.24)
GFR, Estimated: >60 mL/min (ref >60)
Glucose, Bld: 93 mg/dL (ref 70–99)
Potassium: 3.6 mmol/L (ref 3.5–5.1)
Sodium: 137 mmol/L (ref 135–145)

## 2021-08-27 LAB — TRIGLYCERIDES: Triglycerides: 25 mg/dL (ref <150)

## 2021-08-27 LAB — CULTURE, BLOOD (ROUTINE X 2)
Culture: NO GROWTH
Culture: NO GROWTH

## 2021-08-27 LAB — PROTIME-INR
INR: 3.3 — ABNORMAL HIGH (ref 0.8–1.2)
Prothrombin Time: 33 seconds — ABNORMAL HIGH (ref 11.4–15.2)

## 2021-08-27 MED ORDER — STERILE WATER FOR INJECTION IJ SOLN
INTRAMUSCULAR | Status: AC
  Filled 2021-08-27: qty 10

## 2021-08-27 MED ORDER — DEXMEDETOMIDINE HCL IN NACL 400 MCG/100ML IV SOLN
0.4000 ug/kg/h | INTRAVENOUS | Status: DC
  Administered 2021-08-27: 0.4 ug/kg/h via INTRAVENOUS
  Filled 2021-08-27: qty 100

## 2021-08-27 MED ORDER — VITAL AF 1.2 CAL PO LIQD
1000.0000 mL | ORAL | Status: DC
  Administered 2021-08-27 – 2021-08-28 (×2): 1000 mL

## 2021-08-27 MED ORDER — TRANEXAMIC ACID FOR INHALATION
500.0000 mg | Freq: Once | RESPIRATORY_TRACT | Status: AC
  Administered 2021-08-27: 500 mg via RESPIRATORY_TRACT
  Filled 2021-08-27: qty 5

## 2021-08-27 MED ORDER — METHYLPREDNISOLONE SODIUM SUCC 40 MG IJ SOLR
40.0000 mg | Freq: Every day | INTRAMUSCULAR | Status: AC
  Administered 2021-08-27 – 2021-08-29 (×3): 40 mg via INTRAVENOUS
  Filled 2021-08-27 (×3): qty 1

## 2021-08-27 MED ORDER — MIDAZOLAM HCL 2 MG/2ML IJ SOLN
2.0000 mg | Freq: Once | INTRAMUSCULAR | Status: AC
Start: 2021-08-27 — End: 2021-08-27
  Administered 2021-08-27: 1 mg via INTRAVENOUS
  Filled 2021-08-27: qty 2

## 2021-08-27 MED ORDER — FENTANYL CITRATE PF 50 MCG/ML IJ SOSY
50.0000 ug | PREFILLED_SYRINGE | Freq: Once | INTRAMUSCULAR | Status: AC
  Administered 2021-08-27: 25 ug via INTRAVENOUS
  Filled 2021-08-27: qty 1

## 2021-08-27 MED ORDER — VECURONIUM BROMIDE 10 MG IV SOLR
10.0000 mg | INTRAVENOUS | Status: DC | PRN
  Filled 2021-08-27: qty 10

## 2021-08-27 MED ORDER — NOREPINEPHRINE 4 MG/250ML-% IV SOLN
0.0000 ug/min | INTRAVENOUS | Status: DC
  Administered 2021-08-27: 5 ug/min via INTRAVENOUS
  Administered 2021-08-27: 2 ug/min via INTRAVENOUS
  Filled 2021-08-27 (×2): qty 250

## 2021-08-27 NOTE — Progress Notes (Signed)
NAME:  Andrew [name], MRN:  [id number], DOB:  Nov 16, 1935, LOS: 5 ADMISSION DATE:  08/22/2021, CONSULTATION DATE:  08/22/21 REFERRING MD:  Dr. Jacqualine Code, CHIEF COMPLAINT:  Shortness of Breath   Brief Patient Description / Synopsis:  86 yo white male admitted to ICU for Acute Hypoxic/ Hypercapnic Respiratory Failure secondary to CAP and aspiration pneumonitis, suspected pulmonary edema, and SBO requiring mechanical ventilation.   History of Present Illness:  86 yo M presenting to [facility] ED from home via EMS with complaints of dyspnea, hypoxia & fatigue. History provided by the patient, and the patient's wife and daughter who are bedside. The patient was in his normal state of health until Thursday 08/21/21 when the patient developed lower abdominal pain and the "shakes". He was diagnosed with a UTI and started on Keflex. Then on 08/22/21 he took a nap and was awoken by his wife around 4:30 pm to take some medicine. At this time he woke up and was dyspneic, unable to complete a sentence, as well as severely fatigued unable to stand. EMS was called and upon their arrival his SpO2 was in the 50's, he was placed on CPAP improving to 85-89% and received 125 mg of Solumedrol, 2 g of Mg & 1 duo neb en route. Of note the patient also had an unwitnessed fall on 7/20, he has been taking all medication as prescribed including his Warfarin.  ED course: Upon arrival the patient was placed on the BIPAP for oxygen support, Sepsis protocol initiated. Due to respiratory status and concerns for fluid volume overload, only 1 L of LR bolus with improvement in color and perfusion per ED report. Lab work revealed severe lactic acidosis with mild leukocytosis and an elevated PCT, hyperglycemia, hyponatremia with pseudohyponatremia component, thrombocytopenia- though only slightly lower than baseline, AKI, elevated troponin- suspect from demand ischemia & respiratory acidosis Medications given: Cefepime & vancomycin, Duo neb, 1 L of  LR Initial Vitals: 97.5, 18, 60, 105/49 & SpO2 93% on BIPAP Significant labs: (Labs/ Imaging personally reviewed) I, Domingo Pulse [place], AGACNP-BC, personally viewed and interpreted this ECG. EKG Interpretation: Date: 08/22/21, EKG Time: 18:18, Rate: 60, Rhythm: A-fib with permanent pacemaker in place, QRS Axis: extreme axis, Intervals: prolonged Qtc in the setting of paced rhythm, ST/T Wave abnormalities: none, Narrative Interpretation: Atrial Fibrillation with permanent pacemaker Chemistry: Na+: 124, K+: 4.4, BUN/Cr.: 69/ 1.40, Serum CO2/ AG: 25/ 11, Glucose: 398 Hematology: WBC: 11.3, Hgb: 10.5, plt: 140  Troponin: 204 > 177, BNP: 97.9, Lactic/ PCT: 6.4 > 5.1/ 6.87, COVID-19 & Influenza A/B: negative VBG: 7.16/ 66/ 42/ 23.5 >> ABG: 7.25/ 64/ 92/ 23.7  CXR 08/22/21: Bilateral multifocal airspace opacities concerning for multifocal pneumonia vs pulmonary edema CT head wo contrast 08/22/21: pending CT abdomen/pelvis wo contrast 08/22/21: pending  PCCM consulted for admission due to acute hypoxic respiratory failure at [place] for intubation due to increased work of breathing.  Pertinent  Medical History  T2DM PAF on Coumadin HFpEF HLD HTN  MICRO Data:  7/21: SARS-CoV-2 & Influenza PCR>>negative 7/21: Blood culture x2>>NGTD 7/22: MRSA PCR>> negative  Antimicrobials:  Cefepime 7/21 x1 dose Vancomycin 7/21 x1 dose Ceftriaxone 7/22>> Doxycycline 7/22>>  Significant Hospital Events: Including procedures, antibiotic start and stop dates in addition to other pertinent events   08/22/21: Admit to ICU with acute hypoxic respiratory failure on BIPAP with HIGH RISK for INTUBATION due to increased WOB. 08/24/21: Patient was made DNR by the family surgery saw him for the small bowel obstruction she is not  a surgical candidate he is slowly coming off pressors and his lactic acid normalized. 7/24 remains on vent.  Trickle feeds started 7/25: Tolerating trickle feeds. Plan for SBT.  Diurese  with Lasix 40 mg x1 dose 7/26: Failed SBT.  With small volume hemoptysis.  Likely needs Bronchoscopy, start steroids.  Not following commands. Obtain MRI brain.  Interim History / Subjective:  -No significant events noted overnight -Afebrile, hemodynamically stable (BP soft), no vasopressors -Sedation was left off yesterday (since taking long time wake up), however overnight agitated biting tube and asynchronous with vent ~ started on Precedex this am to help with WUA ~ not following commands for 2 days when sedation off ~ obtain MRI brain -Tolerating trickle feeds ~ no vomiting/high residuals reported, no change in abdominal exam (remains slightly taught and distended) -Follow up KUB this morning without evidence of SBO/ileus -On minimal vent support, plan for SBT once family at bedside ~ failed due to increased WOB and secretions/hemoptysis -Low volume hemoptysis ~ Dr. Mortimer Fries to discuss with family about need for Bronchoscopy, also will give tranexamic acid neb and start steroids -Diuresed well yesterday with 2L UOP (net + 850 cc), Creatinine remains stable at 1.11 from 1.03  Objective   Blood pressure (!) 98/55, pulse 60, temperature 97.7 F (36.5 C), temperature source Axillary, resp. rate 20, height 5' 10"  (1.778 m), weight 86.3 kg, SpO2 99 %.    Vent Mode: PRVC FiO2 (%):  [30 %-35 %] 35 % Set Rate:  [18 bmp] 18 bmp Vt Set:  [500 mL] 500 mL PEEP:  [5 cmH20] 5 cmH20 Pressure Support:  [5 cmH20-15 cmH20] 10 cmH20   Intake/Output Summary (Last 24 hours) at 08/27/2021 0727 Last data filed at 08/27/2021 6761 Gross per 24 hour  Intake 1039.42 ml  Output 2125 ml  Net -1085.58 ml    Filed Weights   08/25/21 0411 08/26/21 0500 08/27/21 0426  Weight: 86.3 kg 86.5 kg 86.3 kg    Examination: General: Acute on chronically ill-appearing male, laying in bed, intubated and sedated, no acute distress HEENT: MM pink/moist, anicteric, atraumatic, neck supple, orally intubated, small amount of  hemoptysis from ETT Neuro: Sedated, currently not following commands, nursing reports when sedation lightened becomes anxious/agitated, pupils PERRLA CV: s1s2 RRR, controlled A-fib with pacemaker on monitor, no r/m/g Pulm: Coarse breath sounds bilaterally, occasionally overbreathing the ventilator, even, nonlabored GI: soft, slightly distended, slightly tender lower abdomen, bs x 4 Skin: scattered ecchymosis, including LUE Extremities: warm/dry, pulses + 2 R/P, +1 edema noted BLE  Resolved Hospital Problem list     Assessment & Plan:   Acute Hypoxic/ Hypercapnic Respiratory Failure secondary to CAP & aspiration pneumonitis, and suspected pulmonary edema  Small volume Hemoptysis, likely in setting of supra-therapeutic INR -Full vent support, implement lung protective strategies -Plateau pressures less than 30 cm H20 -Wean FiO2 & PEEP as tolerated to maintain O2 sats >92% -Follow intermittent Chest X-ray & ABG as needed -Spontaneous Breathing Trials when respiratory parameters met and mental status permits -Implement VAP Bundle -Prn Bronchodilators -ABX as above -Diuresis as BP & renal function permits  -Start Steroids, give tranexic acid neb -Likely needs Bronchoscopy, Dr. Mortimer Fries to discuss with family  Suspected Severe Sepsis without septic shock due to suspected CAP & SBO -Monitor fever curve -Trend WBC's & Procalcitonin -Follow cultures as above -Continue empiric Ceftriaxone & Doxycycline pending cultures & sensitivities  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 Elevated Troponin secondary to demand ischemia Chronic Atrial Fibrillation Moderate to severe  Aortic Stenosis PMHx: HFpEF, permanent pacemaker, HTN, HLD ECHO 08/24/21: LVEF 25-30%, grade II DD, RV normal, moderate to severe aortic stenosis -Continuous cardiac monitoring -Maintain MAP >65 -Vasopressors as needed to maintain MAP goal -Lactic acid has normalized (6.4 ~ 5.1 ~ 2.3 ~ 1.6) -HS Troponin peaked at  204 -Diuresis as BP and renal function permits ~ hold today 7/26 due to soft BP  Small Bowel Obstruction ~ RESOLVED -Trickle feeds started 7/24, currently tolerating -Follow serial abdominal exam and KUB as needed -Evaluated by General Surgery ~ felt not to be surgical candidate due to critical illness and multiorgan failure ~ recommended NGT and medical management  Acute Kidney Injury in the setting of suspected sepsis Acute Hyponatremia secondary to unknown etiology in the setting of slight pseudohyponatremia due to hyperglycemia ~ RESOLVED -Monitor I&O's / urinary output -Follow BMP -Ensure adequate renal perfusion -Avoid nephrotoxic agents as able -Replace electrolytes as indicated  Type 2 Diabetes Mellitus Steroid Induced Hyperglycemia Hemoglobin A1C: pending - Monitor CBG Q 4 hours - SSI resistant dosing - target range while in ICU: 140-180 - follow ICU hyper/hypo-glycemia protocol  Acute Metabolic Encephalopathy Sedation needs in setting of mechanical ventilation Dementia - early stages CT Head on admission negative for acute intracranial abnormality -Maintain a RASS goal of 0 to -1 -Fentanyl and Propofol as needed to maintain RASS goal -Avoid sedating medications as able -Daily wake up assessment -Needs MRI Brain, family requesting that we wait until 7/27 (they are discussing of [place])   Best Practice (right click and "Reselect all SmartList Selections" daily)  Diet/type: NPO, trickle feeds DVT prophylaxis: DOAC (on hold due to supra therapeutic INR and now low volume hemoptysis) GI prophylaxis: PPI Lines: Left IJ CVC, and is still needed Foley:  Yes, and it is still needed Code Status:  DNR Last date of multidisciplinary goals of care discussion [08/27/21]  Pt's wife and daughter updated at bedside 7/26  Labs   CBC: Recent Labs  Lab 08/22/21 1821 08/23/21 0444 08/24/21 0308 08/25/21 0237  WBC 11.3* 8.8 6.2 3.5*  NEUTROABS 9.9*  --   --  2.7  HGB 10.5*  9.0* 8.0* 7.6*  HCT 32.3* 27.4* 24.3* 22.9*  MCV 96.4 92.9 92.4 91.6  PLT 140* 136* 93* 74*     Basic Metabolic Panel: Recent Labs  Lab 08/22/21 2106 08/23/21 0145 08/23/21 0444 08/23/21 1125 08/23/21 1821 08/24/21 0308 08/25/21 0237 08/26/21 0415 08/27/21 0235  NA  --    < > 125*   < > 127* 125* 129* 127* 137  K  --   --  4.3  --   --  4.0 3.5 4.2 3.6  CL  --   --  91*  --   --  92* 95* 96* 106  CO2  --   --  27  --   --  26 26 24 24   GLUCOSE  --   --  229*  --   --  183* 61* 152* 93  BUN  --   --  76*  --   --  67* 54* 64* 68*  CREATININE  --   --  1.30*  --   --  1.19 0.95 1.03 1.11  CALCIUM  --   --  8.1*  --   --  8.0* 8.0* 8.1* 7.9*  MG 2.4  --  2.2  --   --  2.2 2.1 2.0  --   PHOS 4.8*  --  4.5  --   --  3.7 3.1 3.6  --    < > = values in this interval not displayed.    GFR: Estimated Creatinine Clearance: 51.2 mL/min (by C-G formula based on SCr of 1.11 mg/dL). Recent Labs  Lab 08/22/21 1821 08/22/21 1822 08/22/21 1825 08/22/21 1835 08/23/21 0444 08/23/21 0509 08/23/21 1125 08/24/21 0308 08/25/21 0237  PROCALCITON  --   --   --  6.87 7.50  --   --  4.68 2.95  WBC 11.3*  --   --   --  8.8  --   --  6.2 3.5*  LATICACIDVEN  --  6.4* 5.1*  --   --  2.3* 1.6  --   --      Liver Function Tests: Recent Labs  Lab 08/22/21 2106  AST 36  ALT 16  ALKPHOS 50  BILITOT 1.0  PROT 5.8*  ALBUMIN 3.2*    No results for input(s): "LIPASE", "AMYLASE" in the last 168 hours. No results for input(s): "AMMONIA" in the last 168 hours.  ABG    Component Value Date/Time   PHART 7.33 (L) 08/23/2021 0130   PCO2ART 48 08/23/2021 0130   PO2ART 299 (H) 08/23/2021 0130   HCO3 25.3 08/23/2021 0130   ACIDBASEDEF 0.9 08/23/2021 0130   O2SAT 99.9 08/23/2021 0130     Coagulation Profile: Recent Labs  Lab 08/23/21 0145 08/24/21 0921 08/25/21 0237 08/26/21 0415 08/27/21 0235  INR 5.3* 4.9* 4.1* 3.3* 3.3*     Cardiac Enzymes: No results for input(s):  "CKTOTAL", "CKMB", "CKMBINDEX", "TROPONINI" in the last 168 hours.  HbA1C: Hgb A1c MFr Bld  Date/Time Value Ref Range Status  08/23/2021 01:45 AM 7.2 (H) 4.8 - 5.6 % Final    Comment:    (NOTE) Pre diabetes:          5.7%-6.4%  Diabetes:              >6.4%  Glycemic control for   <7.0% adults with diabetes   05/19/2021 09:23 PM 8.0 (H) 4.8 - 5.6 % Final    Comment:    (NOTE) Pre diabetes:          5.7%-6.4%  Diabetes:              >6.4%  Glycemic control for   <7.0% adults with diabetes     CBG: Recent Labs  Lab 08/26/21 0733 08/26/21 1111 08/26/21 1615 08/26/21 1921 08/26/21 2335  GLUCAP 162* 134* 173* 141* 159*      Past Medical History:  He,  has a past medical history of CHF (congestive heart failure) ([place]), Diabetes mellitus without complication ([place]), Essential hypertension, Mixed hyperlipidemia, and Permanent atrial fibrillation ([place]).   Surgical History:   Past Surgical History:  Procedure Laterality Date   APPENDECTOMY     INSERT / REPLACE / REMOVE PACEMAKER     PACEMAKER IMPLANT N/A 03/25/2018   Procedure: PACEMAKER IMPLANT;  Surgeon: Thompson Grayer, MD;  Location: [place] [facility];  Service: Cardiovascular;  Laterality: N/A;     Social History:   reports that he has quit smoking. His smoking use included cigarettes. He has a 52.50 pack-year smoking history. He has never used smokeless tobacco. He reports that he does not currently use alcohol. He reports that he does not use drugs.   Family History:  His family history includes Bladder Cancer in his father; CVA in his mother.   Allergies Allergies  Allergen Reactions   Methyldopa Nausea Only and Palpitations    Other reaction(s): Unknown  Other reaction(s): NAUSEA 'palpitation" Other reaction(s): NAUSEA Other reaction(s): NAUSEA      Home Medications  Prior to Admission medications   Medication Sig Start Date End Date Taking? Authorizing Provider  amLODipine (NORVASC) 2.5 MG tablet  Take 2.5 mg by mouth daily. 05/02/21   [provider]  donepezil (ARICEPT) 10 MG tablet Take 10 mg by mouth at bedtime. 03/14/21   [provider]  doxazosin (CARDURA) 4 MG tablet Take 4 mg by mouth Nightly. 12/31/16   [provider]  furosemide (LASIX) 80 MG tablet Take 80 mg by mouth daily.    [provider]  glimepiride (AMARYL) 2 MG tablet Take 4 mg by mouth daily. 08/14/16   [provider]  guaiFENesin-dextromethorphan (ROBITUSSIN DM) 100-10 MG/5ML syrup Take 5 mLs by mouth every 4 (four) hours as needed for cough. 05/21/21   Annita Brod, MD  lisinopril (ZESTRIL) 20 MG tablet Take 20 mg by mouth daily. 05/12/21   [provider]  memantine (NAMENDA) 10 MG tablet Take 10 mg by mouth daily. 05/18/21   [provider]  metFORMIN (GLUCOPHAGE) 1000 MG tablet Take 1,000 mg by mouth 2 (two) times daily.    [provider]  pioglitazone (ACTOS) 15 MG tablet Take 15 mg by mouth daily.     [provider]  Potassium 99 MG TABS Take 1.5 tablets by mouth daily.    [provider]  simvastatin (ZOCOR) 20 MG tablet Take 20 mg by mouth Nightly. 12/31/16   [provider]  vitamin B-12 (CYANOCOBALAMIN) 1000 MCG tablet Take 1,000 mcg by mouth daily.    [provider]  warfarin (COUMADIN) 2.5 MG tablet Take 1.25-2.5 mg by mouth as directed. Take 1/2 tablet (1.25 mg) on Monday, Wednesday and Friday. Take 1 tablet (2.5 mg) on Sunday, Tuesday, Thursday and Saturday. 01/17/21   [provider]      Critical Care Time: 40 minutes   Darel Hong, AGACNP-BC [facility] Prefer epic messenger for cross cover needs If after hours, please call E-link

## 2021-08-27 NOTE — IPAL (Signed)
  Interdisciplinary Goals of Care Family Meeting   Date carried out: 08/27/2021  Location of the meeting: Conference room  Member's involved: Physician, Nurse Practitioner, and Family Member or next of kin   GOALS OF CARE DISCUSSION  The Clinical status was relayed to family in detail-Wife, daughters, Son and granddaughter  Updated and notified of patients medical condition- Patient remains unresponsive and will not open eyes to command.   Patient is having a weak cough and struggling to remove secretions.   Patient with increased WOB and using accessory muscles to breathe Explained to family course of therapy and the modalities   Patient with Progressive multiorgan failure with a very high probablity of a very minimal chance of meaningful recovery despite all aggressive and optimal medical therapy.  PATIENT REMAINS DNR status  Family understands the situation.  Severe Ischemic cardiomyopathy, MOD AS, failure to wean from vent, bleeding from lungs Encephalopathy possible stroke  Family at bedside witnessed [facility]  Family are satisfied with Plan of action and management. All questions answered  Additional CC time 35 mins   Naika Noto Santiago Glad, M.D.  [facility]  Medical Director [facility] Medical Director [facility] Cardio-Pulmonary Department

## 2021-08-27 NOTE — Progress Notes (Signed)
Pharmacy Electrolyte Monitoring Consult:  Pharmacy consulted to assist in monitoring and replacing electrolytes in this 86 y.o. male admitted on 08/22/2021 with Respiratory Distress (Patient from home, dispatch called for SOB; Upon arrival, patient was 50% on RA and was immediately placed on CPAP (85% - 89% on CPAP); Received 1 Duoneb, Solu-Medrol 125 mg IV, and Mg 2 g IV en route)  Labs:  Sodium (mmol/L)  Date Value  08/27/2021 137   Potassium (mmol/L)  Date Value  08/27/2021 3.6   Magnesium (mg/dL)  Date Value  90/30/0923 2.0   Phosphorus (mg/dL)  Date Value  30/08/6224 3.6   Calcium (mg/dL)  Date Value  33/35/4562 7.9 (L)   Albumin (g/dL)  Date Value  56/38/9373 3.2 (L)   Assessment/Plan: --No electrolyte replacement indicated at this time --Follow-up electrolytes with AM labs tomorrow  Tressie Ellis 08/27/2021 8:00 AM

## 2021-08-27 NOTE — Progress Notes (Signed)
Bedside bronchoscopy done - time out performed by MD - bronch insertion 1230 - removal 1233. BAL brought to lab. Pt tolerated procedure well

## 2021-08-27 NOTE — Progress Notes (Signed)
Nutrition Follow Up Note   DOCUMENTATION CODES:   Non-severe (moderate) malnutrition in context of chronic illness  INTERVENTION:   Vital 1.2@55ml /hr- Increase to 74m/hr, once tolerating advance by 175mhr q 8 hours until goal rate is reached.   Pro-Source 4575mID via tube, provides 40kcal and 11g of protein per serving   Free water flushes 6m77m hours to maintain tube patency   Regimen provides 1704kcal/day, 132g/day protein and 1250ml28m of free water.   Pt at high refeed risk; recommend monitor potassium, magnesium and phosphorus labs daily until stable  Daily weights   NUTRITION DIAGNOSIS:   Moderate Malnutrition related to chronic illness (CHF, advanced age) as evidenced by moderate fat depletion, moderate muscle depletion, severe muscle depletion.  GOAL:   Provide needs based on ASPEN/SCCM guidelines -not met   MONITOR:   Vent status, Labs, Weight trends, TF tolerance, Skin, I & O's  ASSESSMENT:   86 y/22male with h/o CHF, HTN, DM, PAF, HLD and dementia who is admitted with septic shock, CAP, UTI, AKI and NSTEMI.  Pt remains sedated and ventilated. NGT in place. Pt tolerating tube feeds well at 10ml/84mPt with increased abdominal distension but did have a BM today. KUB from today with normal bowel gas pattern. No vomiting noted. RD suspects pt with resolving ileus. Will increase tube feeds to 20ml/h57md see how patient tolerates. Refeed labs stable for now. Per chart, pt is up ~30lbs since admission and is up ~20lbs from his UBW. Pt +708ml on3m I & Os. Plan is for one way extubation when appropriate.   Medications reviewed and include: colace, insulin, solu-medrol, protonix, miralax, precedex, levophed   Labs reviewed: K 3.6 wnl, BUN 68(H) P 3.6 wnl, Mg 2.0 wnl- 7/25 Wbc- 3.5(L), Hgb 7.6(L), Hct 22.9(L)- 7/24 Cbgs- 162, 91 x 24 hrs  Patient is currently intubated on ventilator support MV: 9.7 L/min Temp (24hrs), Avg:98.3 F (36.8 C), Min:97.7 F (36.5  C), Max:99 F (37.2 C)  Propofol: none   MAP- >56-67mmHg  3m- 2125ml   Di37mrder:   Diet Order             Diet NPO time specified  Diet effective now                  EDUCATION NEEDS:   No education needs have been identified at this time  Skin:  Skin Assessment: Reviewed RN Assessment (ecchymosis)  Last BM:  7/26- type 7  Height:   Ht Readings from Last 1 Encounters:  08/27/21 5' 10"  (1.778 m)    Weight:   Wt Readings from Last 1 Encounters:  08/27/21 86.3 kg    Ideal Body Weight:  75.45 kg  BMI:  Body mass index is 27.3 kg/m.  Estimated Nutritional Needs:   Kcal:  1710kcal/day  Protein:  120-140g/day  Fluid:  1.9-2.2L/day  Hephzibah Strehle CampKoleen DistanceDN Please refer to [facility]ION for [facility]/or RD on-call/weekend/after hours pager

## 2021-08-27 NOTE — Procedures (Signed)
PROCEDURE: BRONCHOSCOPY Therapeutic Aspiration of Tracheobronchial Tree  PROCEDURE DATE: 08/27/2021  TIME:  NAME:  Andrew [name]  DOB:10-27-1935  MRN: [id number] LOC:  IC14A/IC14A-AA    HOSP DAY: _0 @ CODE STATUS:      Code Status Orders  (From admission, onward)           Start     Ordered   08/23/21 1658  Do not attempt resuscitation (DNR)  Continuous       Question Answer Comment  In the event of cardiac or respiratory ARREST Do not call a "code blue"   In the event of cardiac or respiratory ARREST Do not perform Intubation, CPR, defibrillation or ACLS   In the event of cardiac or respiratory ARREST Use medication by any route, position, wound care, and other measures to relive pain and suffering. May use oxygen, suction and manual treatment of airway obstruction as needed for comfort.      08/23/21 1657           Code Status History     Date Active Date Inactive Code Status Order ID Comments User Context   08/22/2021 2129 08/23/2021 1657 Full Code [id number]  Rust-Chester, Huel Cote, NP ED   05/19/2021 2339 05/21/2021 1713 Full Code [id number]  Para Skeans, MD ED   10/06/2018 1034 10/08/2018 1820 Full Code [id number]  Lang Snow, NP ED   03/25/2018 1041 03/26/2018 1601 Full Code [id number]  Theora Gianotti, NP Inpatient   03/24/2018 1201 03/25/2018 1007 Full Code [id number]  Nicholes Mango, MD Inpatient           Indications/Preliminary Diagnosis: Hemoptysis  Consent: (Place X beside choice/s below)  The benefits, risks and possible complications of the procedure were        explained to:  ___ patient  __x_ patient's family  ___ other:___________  who verbalized understanding and gave:  ___ verbal  ___ written  __x_ verbal and written  ___ telephone  ___ other:________ consent.      Unable to obtain consent; procedure performed on emergent basis.     Other:       PRESEDATION ASSESSMENT: History and Physical has been performed.  Patient meds and allergies have been reviewed. Presedation airway examination has been performed and documented. Baseline vital signs, sedation score, oxygenation status, and cardiac rhythm were reviewed. Patient was deemed to be in satisfactory condition to undergo the procedure.    PREMEDICATIONS:   Sedative/Narcotic Amt Dose   Versed 1 mg   Fentanyl 25 mcg  Diprivan  mg            PROCEDURE DETAILS: Timeout performed and correct patient, name, & ID confirmed. Following prep per Pulmonary policy, appropriate sedation was administered. The Bronchoscope was inserted in to oral cavity with bite block in place. Therapeutic aspiration of Tracheobronchial tree was performed.  Airway exam proceeded with findings, technical procedures, and specimen collection as noted below. At the end of exam the scope was withdrawn without incident. Impression and Plan as noted below.           Airway Prep (Place X beside choice below)   1% Transtracheal Lidocaine Anesthetization 7 cc   Patient prepped per Bronchoscopy Lab Policy       Insertion Route (Place X beside choice below)   Nasal   Oral  x Endotracheal Tube   Tracheostomy        Procedures  Description    None     Electrocautery  Cryotherapy     Balloon Dilatation     Bronchography     Stent Placement   x  Therapeutic Aspiration Thick mucoid bloody secretions    Laser/Argon Plasma    Brachytherapy Catheter Placement    Foreign Body Removal         SPECIMENS (Sites): (Place X beside choice below)  Specimens Description   No Specimens Obtained     Washings   x Lavage RLL Bloody secretions thick mucus bloody   Biopsies    Fine Needle Aspirates    Brushings    Sputum    FINDINGS: RLL bloody mucus assess for pneumonia ESTIMATED BLOOD LOSS: none COMPLICATIONS/RESOLUTION: none   IMPRESSION:POST-PROCEDURE DX: possible pneumonia    RECOMMENDATION/PLAN:  Steroids Await BAL cultures    Corrin Parker,  M.D.  [facility]  Medical Director [place] Director [place] Department

## 2021-08-28 ENCOUNTER — Encounter: Payer: Self-pay | Admitting: Internal Medicine

## 2021-08-28 ENCOUNTER — Other Ambulatory Visit: Payer: Self-pay

## 2021-08-28 DIAGNOSIS — J189 Pneumonia, unspecified organism: Secondary | ICD-10-CM | POA: Diagnosis not present

## 2021-08-28 LAB — BASIC METABOLIC PANEL
Anion gap: 6 (ref 5–15)
BUN: 70 mg/dL — ABNORMAL HIGH (ref 8–23)
CO2: 27 mmol/L (ref 22–32)
Calcium: 8.4 mg/dL — ABNORMAL LOW (ref 8.9–10.3)
Chloride: 106 mmol/L (ref 98–111)
Creatinine, Ser: 1 mg/dL (ref 0.61–1.24)
GFR, Estimated: >60 mL/min (ref >60)
Glucose, Bld: 212 mg/dL — ABNORMAL HIGH (ref 70–99)
Potassium: 3.8 mmol/L (ref 3.5–5.1)
Sodium: 139 mmol/L (ref 135–145)

## 2021-08-28 LAB — CBC
HCT: 25.5 % — ABNORMAL LOW (ref 39.0–52.0)
Hemoglobin: 8.2 g/dL — ABNORMAL LOW (ref 13.0–17.0)
MCH: 30 pg (ref 26.0–34.0)
MCHC: 32.2 g/dL (ref 30.0–36.0)
MCV: 93.4 fL (ref 80.0–100.0)
Platelets: 189 10*3/uL (ref 150–400)
RBC: 2.73 MIL/uL — ABNORMAL LOW (ref 4.22–5.81)
RDW: 15.3 % (ref 11.5–15.5)
WBC: 7.7 10*3/uL (ref 4.0–10.5)
nRBC: 0 % (ref 0.0–0.2)

## 2021-08-28 LAB — GLUCOSE, CAPILLARY
Glucose-Capillary: 151 mg/dL — ABNORMAL HIGH (ref 70–99)
Glucose-Capillary: 151 mg/dL — ABNORMAL HIGH (ref 70–99)
Glucose-Capillary: 174 mg/dL — ABNORMAL HIGH (ref 70–99)
Glucose-Capillary: 185 mg/dL — ABNORMAL HIGH (ref 70–99)
Glucose-Capillary: 196 mg/dL — ABNORMAL HIGH (ref 70–99)
Glucose-Capillary: 202 mg/dL — ABNORMAL HIGH (ref 70–99)
Glucose-Capillary: 211 mg/dL — ABNORMAL HIGH (ref 70–99)

## 2021-08-28 LAB — MAGNESIUM: Magnesium: 2.4 mg/dL (ref 1.7–2.4)

## 2021-08-28 LAB — PROTIME-INR
INR: 3.2 — ABNORMAL HIGH (ref 0.8–1.2)
Prothrombin Time: 32.3 seconds — ABNORMAL HIGH (ref 11.4–15.2)

## 2021-08-28 MED ORDER — HALOPERIDOL LACTATE 5 MG/ML IJ SOLN
2.0000 mg | Freq: Four times a day (QID) | INTRAMUSCULAR | Status: DC | PRN
  Administered 2021-08-29 – 2021-08-30 (×2): 2 mg via INTRAVENOUS
  Filled 2021-08-28 (×2): qty 1

## 2021-08-28 MED ORDER — MEMANTINE HCL 5 MG PO TABS
10.0000 mg | ORAL_TABLET | Freq: Every day | ORAL | Status: DC
  Administered 2021-08-28 – 2021-08-31 (×4): 10 mg via ORAL
  Filled 2021-08-28: qty 1
  Filled 2021-08-28 (×3): qty 2

## 2021-08-28 MED ORDER — GUAIFENESIN-DM 100-10 MG/5ML PO SYRP: 5.0000 mL | ORAL_SOLUTION | Freq: Two times a day (BID) | ORAL | Status: DC | PRN

## 2021-08-28 MED ORDER — IPRATROPIUM-ALBUTEROL 0.5-2.5 (3) MG/3ML IN SOLN
3.0000 mL | RESPIRATORY_TRACT | Status: DC
  Administered 2021-08-28 – 2021-08-29 (×4): 3 mL via RESPIRATORY_TRACT
  Filled 2021-08-28 (×5): qty 3

## 2021-08-28 MED ORDER — BUDESONIDE 0.5 MG/2ML IN SUSP
0.5000 mg | Freq: Two times a day (BID) | RESPIRATORY_TRACT | Status: DC
  Administered 2021-08-28 – 2021-09-01 (×8): 0.5 mg via RESPIRATORY_TRACT
  Filled 2021-08-28 (×7): qty 2

## 2021-08-28 MED ORDER — DONEPEZIL HCL 5 MG PO TABS
10.0000 mg | ORAL_TABLET | Freq: Every day | ORAL | Status: DC
  Administered 2021-08-28 – 2021-08-31 (×4): 10 mg via ORAL
  Filled 2021-08-28 (×4): qty 2

## 2021-08-28 MED ORDER — VITAL 1.5 CAL PO LIQD
1000.0000 mL | ORAL | Status: DC
Start: 2021-08-28 — End: 2021-08-29
  Administered 2021-08-29: 1000 mL

## 2021-08-28 MED ORDER — GUAIFENESIN-DM 100-10 MG/5ML PO SYRP
10.0000 mL | ORAL_SOLUTION | ORAL | Status: DC | PRN
  Administered 2021-08-28: 10 mL
  Filled 2021-08-28: qty 10

## 2021-08-28 NOTE — Progress Notes (Signed)
Pharmacy Electrolyte Monitoring Consult:  Pharmacy consulted to assist in monitoring and replacing electrolytes in this 86 y.o. male admitted on 08/22/2021 with Respiratory Distress (Patient from home, dispatch called for SOB; Upon arrival, patient was 50% on RA and was immediately placed on CPAP (85% - 89% on CPAP); Received 1 Duoneb, Solu-Medrol 125 mg IV, and Mg 2 g IV en route)  Labs:  Sodium (mmol/L)  Date Value  08/28/2021 139   Potassium (mmol/L)  Date Value  08/28/2021 3.8   Magnesium (mg/dL)  Date Value  11/94/1740 2.4   Phosphorus (mg/dL)  Date Value  81/44/8185 3.6   Calcium (mg/dL)  Date Value  63/14/9702 8.4 (L)   Albumin (g/dL)  Date Value  63/78/5885 3.2 (L)   Assessment/Plan: --Electrolytes have been stable and no replacement has been indicated for several days --Will discontinue electrolyte consult at this time. Defer further ordering of labs and replacement of electrolytes to primary team --Pharmacy will continue to follow along peripherally  Tressie Ellis 08/28/2021 8:31 AM

## 2021-08-28 NOTE — Progress Notes (Addendum)
0730 started weaning sedation and pressors per orders plan to see if patient follows command to wean 0800 sedation off updated wife via phone 0830 patient following commands pressors off patient following commands 0915 patient weaning tolerating well pulling great volumes following all commands 0930 turned off TF as precautions to decrease chances of aspiration with extubation 0956 patient extubated to 2L Weed all family at bedside 1620 swallow done at bedside unsure if straw is causing coughing or of coughing from extubation puree diet and no straws added at this time.

## 2021-08-28 NOTE — Progress Notes (Signed)
NAME:  Andrew [name], MRN:  [id number], DOB:  11/23/1935, LOS: 6 ADMISSION DATE:  08/22/2021, CONSULTATION DATE:  08/22/21 REFERRING MD:  Dr. Jacqualine Code, CHIEF COMPLAINT:  Shortness of Breath   Brief Patient Description / Synopsis:  86 yo white male admitted to ICU for Acute Hypoxic/ Hypercapnic Respiratory Failure secondary to CAP and aspiration pneumonitis, suspected pulmonary edema, and SBO requiring mechanical ventilation.   History of Present Illness:  86 yo M presenting to [facility] ED from home via EMS with complaints of dyspnea, hypoxia & fatigue. History provided by the patient, and the patient's wife and daughter who are bedside. The patient was in his normal state of health until Thursday 08/21/21 when the patient developed lower abdominal pain and the "shakes". He was diagnosed with a UTI and started on Keflex. Then on 08/22/21 he took a nap and was awoken by his wife around 4:30 pm to take some medicine. At this time he woke up and was dyspneic, unable to complete a sentence, as well as severely fatigued unable to stand. EMS was called and upon their arrival his SpO2 was in the 50's, he was placed on CPAP improving to 85-89% and received 125 mg of Solumedrol, 2 g of Mg & 1 duo neb en route. Of note the patient also had an unwitnessed fall on 7/20, he has been taking all medication as prescribed including his Warfarin.  ED course: Upon arrival the patient was placed on the BIPAP for oxygen support, Sepsis protocol initiated. Due to respiratory status and concerns for fluid volume overload, only 1 L of LR bolus with improvement in color and perfusion per ED report. Lab work revealed severe lactic acidosis with mild leukocytosis and an elevated PCT, hyperglycemia, hyponatremia with pseudohyponatremia component, thrombocytopenia- though only slightly lower than baseline, AKI, elevated troponin- suspect from demand ischemia & respiratory acidosis Medications given: Cefepime & vancomycin, Duo neb, 1 L of  LR Initial Vitals: 97.5, 18, 60, 105/49 & SpO2 93% on BIPAP Significant labs: (Labs/ Imaging personally reviewed) I, Domingo Pulse Rust-Chester, AGACNP-BC, personally viewed and interpreted this ECG. EKG Interpretation: Date: 08/22/21, EKG Time: 18:18, Rate: 60, Rhythm: A-fib with permanent pacemaker in place, QRS Axis: extreme axis, Intervals: prolonged Qtc in the setting of paced rhythm, ST/T Wave abnormalities: none, Narrative Interpretation: Atrial Fibrillation with permanent pacemaker Chemistry: Na+: 124, K+: 4.4, BUN/Cr.: 69/ 1.40, Serum CO2/ AG: 25/ 11, Glucose: 398 Hematology: WBC: 11.3, Hgb: 10.5, plt: 140  Troponin: 204 > 177, BNP: 97.9, Lactic/ PCT: 6.4 > 5.1/ 6.87, COVID-19 & Influenza A/B: negative VBG: 7.16/ 66/ 42/ 23.5 >> ABG: 7.25/ 64/ 92/ 23.7  CXR 08/22/21: Bilateral multifocal airspace opacities concerning for multifocal pneumonia vs pulmonary edema CT head wo contrast 08/22/21: pending CT abdomen/pelvis wo contrast 08/22/21: pending  PCCM consulted for admission due to acute hypoxic respiratory failure at [place] for intubation due to increased work of breathing.  Pertinent  Medical History  T2DM PAF on Coumadin HFpEF HLD HTN  MICRO Data:  7/21: SARS-CoV-2 & Influenza PCR>>negative 7/21: Blood culture x2>>NGTD 7/22: MRSA PCR>> negative 7/26: BAL>>  Antimicrobials:  Cefepime 7/21 x1 dose Vancomycin 7/21 x1 dose Ceftriaxone 7/22>>7/26 Doxycycline 7/22>>7/26  Significant Hospital Events: Including procedures, antibiotic start and stop dates in addition to other pertinent events   08/22/21: Admit to ICU with acute hypoxic respiratory failure on BIPAP with HIGH RISK for INTUBATION due to increased WOB. 08/24/21: Patient was made DNR by the family surgery saw him for the small bowel obstruction she  is not a surgical candidate he is slowly coming off pressors and his lactic acid normalized. 7/24 remains on vent.  Trickle feeds started 7/25: Tolerating trickle feeds.  Plan for SBT.  Diurese with Lasix 40 mg x1 dose 7/26: Failed SBT.  With small volume hemoptysis.  Bronchoscopy performed, start steroids.  Not following commands. Considering MRI brain, family requests that we wait 7/27: No hemoptysis reported. Upon WUA, following commands.  SBT in place, hopeful for extubation.  Interim History / Subjective:  -No significant events noted overnight -Afebrile, hemodynamically stable, vasopressors being weaned off -No reports of hemoptysis overnight, on minimal vent settings ~ plan for SBT as mental status permits -Upon WUA, following commands ~ hopeful for extubation -Tolerating trickle feeds ~ no vomiting/high residuals reported, no change in abdominal exam (remains slightly taught and distended) -Follow up KUB  without evidence of SBO/ileus   Objective   Blood pressure 116/64, pulse 60, temperature 97.8 F (36.6 C), temperature source Oral, resp. rate 20, height _0  (1.778 m), weight 84 kg, SpO2 100 %.    Vent Mode: PRVC FiO2 (%):  [30 %-40 %] 30 % Set Rate:  [18 bmp] 18 bmp Vt Set:  [500 mL] 500 mL PEEP:  [5 cmH20] 5 cmH20 Plateau Pressure:  [14 cmH20] 14 cmH20   Intake/Output Summary (Last 24 hours) at 08/28/2021 0932 Last data filed at 08/28/2021 0915 Gross per 24 hour  Intake 1412.88 ml  Output 2395 ml  Net -982.12 ml    Filed Weights   08/26/21 0500 08/27/21 0426 08/28/21 0350  Weight: 86.5 kg 86.3 kg 84 kg    Examination: General: Acute on chronically ill-appearing male, laying in bed, WUA in progress, no acute distress HEENT: MM pink/moist, anicteric, atraumatic, neck supple, orally intubated Neuro: Sedation off, following simple commands (MAE and lifting head off bed), pupils PERRL CV: s1s2 RRR, controlled A-fib with pacemaker on monitor, no r/m/g Pulm: Coarse breath sounds bilaterally, overbreathing the ventilator, even, nonlabored GI: soft, slightly distended, slightly tender lower abdomen, bs x 4 Skin: scattered ecchymosis,  including LUE Extremities: warm/dry, pulses + 2 R/P, +1 edema noted BLE  Resolved Hospital Problem list     Assessment & Plan:   Acute Hypoxic/ Hypercapnic Respiratory Failure secondary to CAP & aspiration pneumonitis, and suspected pulmonary edema  Small volume Hemoptysis, likely in setting of supra-therapeutic INR -Full vent support, implement lung protective strategies -Plateau pressures less than 30 cm H20 -Wean FiO2 & PEEP as tolerated to maintain O2 sats >92% -Follow intermittent Chest X-ray & ABG as needed -Spontaneous Breathing Trials when respiratory parameters met and mental status permits -Implement VAP Bundle -Prn Bronchodilators -ABX as above -Diuresis as BP & renal function permits  -Continue Steroids -S/p Bronchoscopy 7/26  Suspected Severe Sepsis without septic shock due to suspected CAP & SBO ~ TREATED -Monitor fever curve -Trend WBC's & Procalcitonin -Follow cultures as above -Completed course of Ceftriaxone & Doxycycline   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 Elevated Troponin secondary to demand ischemia Chronic Atrial Fibrillation Moderate to severe Aortic Stenosis PMHx: HFpEF, permanent pacemaker, HTN, HLD ECHO 08/24/21: LVEF 25-30%, grade II DD, RV normal, moderate to severe aortic stenosis -Continuous cardiac monitoring -Maintain MAP >65 -Vasopressors as needed to maintain MAP goal -Lactic acid has normalized (6.4 ~ 5.1 ~ 2.3 ~ 1.6) -HS Troponin peaked at 204 -Diuresis as BP and renal function permits ~ holding for now due to low dose pressors  Small Bowel Obstruction ~ RESOLVED -Trickle feeds started 7/24,  currently tolerating -Follow serial abdominal exam and KUB as needed -Evaluated by General Surgery ~ felt not to be surgical candidate due to critical illness and multiorgan failure ~ recommended NGT and medical management  Acute Kidney Injury in the setting of suspected sepsis ~ IMPROVED Acute Hyponatremia secondary to unknown  etiology in the setting of slight pseudohyponatremia due to hyperglycemia ~ RESOLVED -Monitor I&O's / urinary output -Follow BMP -Ensure adequate renal perfusion -Avoid nephrotoxic agents as able -Replace electrolytes as indicated  Type 2 Diabetes Mellitus Steroid Induced Hyperglycemia Hemoglobin A1C: pending - Monitor CBG Q 4 hours - SSI resistant dosing - target range while in ICU: 140-180 - follow ICU hyper/hypo-glycemia protocol  Acute Metabolic Encephalopathy ~ IMPROVING Sedation needs in setting of mechanical ventilation Dementia - early stages CT Head on admission negative for acute intracranial abnormality -Maintain a RASS goal of 0 to -1 -Fentanyl and Propofol as needed to maintain RASS goal -Avoid sedating medications as able -Daily wake up assessment   Best Practice (right click and "Reselect all SmartList Selections" daily)  Diet/type: NPO, trickle feeds DVT prophylaxis: DOAC (on hold due to supra therapeutic INR and hemoptysis) GI prophylaxis: PPI Lines: Left IJ CVC, and is still needed Foley:  Yes, and it is still needed Code Status:  DNR Last date of multidisciplinary goals of care discussion [08/28/21]  Pt's wife and daughter updated at bedside 7/27  Labs   CBC: Recent Labs  Lab 08/22/21 1821 08/23/21 0444 08/24/21 0308 08/25/21 0237 08/28/21 0521  WBC 11.3* 8.8 6.2 3.5* 7.7  NEUTROABS 9.9*  --   --  2.7  --   HGB 10.5* 9.0* 8.0* 7.6* 8.2*  HCT 32.3* 27.4* 24.3* 22.9* 25.5*  MCV 96.4 92.9 92.4 91.6 93.4  PLT 140* 136* 93* 74* 189     Basic Metabolic Panel: Recent Labs  Lab 08/22/21 2106 08/23/21 0145 08/23/21 0444 08/23/21 1125 08/24/21 0308 08/25/21 0237 08/26/21 0415 08/27/21 0235 08/28/21 0521  NA  --    < > 125*   < > 125* 129* 127* 137 139  K  --   --  4.3  --  4.0 3.5 4.2 3.6 3.8  CL  --   --  91*  --  92* 95* 96* 106 106  CO2  --   --  27  --  _0 GLUCOSE  --   --  229*  --  183* 61* 152* 93 212*  BUN  --   --   76*  --  67* 54* 64* 68* 70*  CREATININE  --   --  1.30*  --  1.19 0.95 1.03 1.11 1.00  CALCIUM  --   --  8.1*  --  8.0* 8.0* 8.1* 7.9* 8.4*  MG 2.4  --  2.2  --  2.2 2.1 2.0  --  2.4  PHOS 4.8*  --  4.5  --  3.7 3.1 3.6  --   --    < > = values in this interval not displayed.    GFR: Estimated Creatinine Clearance: 56.8 mL/min (by C-G formula based on SCr of 1 mg/dL). Recent Labs  Lab 08/22/21 1822 08/22/21 1825 08/22/21 1835 08/23/21 0444 08/23/21 0509 08/23/21 1125 08/24/21 0308 08/25/21 0237 08/28/21 0521  PROCALCITON  --   --  6.87 7.50  --   --  4.68 2.95  --   WBC  --   --   --  8.8  --   --  6.2 3.5* 7.7  LATICACIDVEN 6.4* 5.1*  --   --  2.3* 1.6  --   --   --      Liver Function Tests: Recent Labs  Lab 08/22/21 2106  AST 36  ALT 16  ALKPHOS 50  BILITOT 1.0  PROT 5.8*  ALBUMIN 3.2*    No results for input(s): "LIPASE", "AMYLASE" in the last 168 hours. No results for input(s): "AMMONIA" in the last 168 hours.  ABG    Component Value Date/Time   PHART 7.33 (L) 08/23/2021 0130   PCO2ART 48 08/23/2021 0130   PO2ART 299 (H) 08/23/2021 0130   HCO3 25.3 08/23/2021 0130   ACIDBASEDEF 0.9 08/23/2021 0130   O2SAT 99.9 08/23/2021 0130     Coagulation Profile: Recent Labs  Lab 08/24/21 0921 08/25/21 0237 08/26/21 0415 08/27/21 0235 08/28/21 0521  INR 4.9* 4.1* 3.3* 3.3* 3.2*     Cardiac Enzymes: No results for input(s): "CKTOTAL", "CKMB", "CKMBINDEX", "TROPONINI" in the last 168 hours.  HbA1C: Hgb A1c MFr Bld  Date/Time Value Ref Range Status  08/23/2021 01:45 AM 7.2 (H) 4.8 - 5.6 % Final    Comment:    (NOTE) Pre diabetes:          5.7%-6.4%  Diabetes:              >6.4%  Glycemic control for   <7.0% adults with diabetes   05/19/2021 09:23 PM 8.0 (H) 4.8 - 5.6 % Final    Comment:    (NOTE) Pre diabetes:          5.7%-6.4%  Diabetes:              >6.4%  Glycemic control for   <7.0% adults with diabetes     CBG: Recent Labs   Lab 08/27/21 1623 08/27/21 1947 08/27/21 2359 08/28/21 0329 08/28/21 0713  GLUCAP 161* 190* 196* 211* 185*      Past Medical History:  He,  has a past medical history of CHF (congestive heart failure) ([place]), Diabetes mellitus without complication ([place]), Essential hypertension, Mixed hyperlipidemia, and Permanent atrial fibrillation ([place]).   Surgical History:   Past Surgical History:  Procedure Laterality Date   APPENDECTOMY     INSERT / REPLACE / REMOVE PACEMAKER     PACEMAKER IMPLANT N/A 03/25/2018   Procedure: PACEMAKER IMPLANT;  Surgeon: Thompson Grayer, MD;  Location: [facility];  Service: Cardiovascular;  Laterality: N/A;     Social History:   reports that he has quit smoking. His smoking use included cigarettes. He has a 52.50 pack-year smoking history. He has never used smokeless tobacco. He reports that he does not currently use alcohol. He reports that he does not use drugs.   Family History:  His family history includes Bladder Cancer in his father; CVA in his mother.   Allergies Allergies  Allergen Reactions   Methyldopa Nausea Only and Palpitations    Other reaction(s): Unknown Other reaction(s): NAUSEA 'palpitation" Other reaction(s): NAUSEA Other reaction(s): NAUSEA      Home Medications  Prior to Admission medications   Medication Sig Start Date End Date Taking? Authorizing Provider  amLODipine (NORVASC) 2.5 MG tablet Take 2.5 mg by mouth daily. 05/02/21   [provider]  donepezil (ARICEPT) 10 MG tablet Take 10 mg by mouth at bedtime. 03/14/21   [provider]  doxazosin (CARDURA) 4 MG tablet Take 4 mg by mouth Nightly. 12/31/16   [provider]  furosemide (LASIX) 80 MG tablet Take 80  mg by mouth daily.    [provider]  glimepiride (AMARYL) 2 MG tablet Take 4 mg by mouth daily. 08/14/16   [provider]  guaiFENesin-dextromethorphan (ROBITUSSIN DM) 100-10 MG/5ML syrup Take 5 mLs by mouth every 4  (four) hours as needed for cough. 05/21/21   Annita Brod, MD  lisinopril (ZESTRIL) 20 MG tablet Take 20 mg by mouth daily. 05/12/21   [provider]  memantine (NAMENDA) 10 MG tablet Take 10 mg by mouth daily. 05/18/21   [provider]  metFORMIN (GLUCOPHAGE) 1000 MG tablet Take 1,000 mg by mouth 2 (two) times daily.    [provider]  pioglitazone (ACTOS) 15 MG tablet Take 15 mg by mouth daily.     [provider]  Potassium 99 MG TABS Take 1.5 tablets by mouth daily.    [provider]  simvastatin (ZOCOR) 20 MG tablet Take 20 mg by mouth Nightly. 12/31/16   [provider]  vitamin B-12 (CYANOCOBALAMIN) 1000 MCG tablet Take 1,000 mcg by mouth daily.    [provider]  warfarin (COUMADIN) 2.5 MG tablet Take 1.25-2.5 mg by mouth as directed. Take 1/2 tablet (1.25 mg) on Monday, Wednesday and Friday. Take 1 tablet (2.5 mg) on Sunday, Tuesday, Thursday and Saturday. 01/17/21   [provider]      Critical Care Time: 40 minutes   Darel Hong, AGACNP-BC [facility] Prefer epic messenger for cross cover needs If after hours, please call E-link

## 2021-08-28 NOTE — Progress Notes (Signed)
Pt extubated per NP order and placed on [place]. Pt tol procedure well. Will continue to monitor

## 2021-08-28 NOTE — Consult Note (Signed)
   [facility] CM Inpatient Consult   08/28/2021  CORBETT MOULDER 1935/11/01 [id number]  [facility] [[facility]]  Accountable Care Organization [ACO] Patient: [employer]   *Remote coverage review, patient at [facility]  Primary Care Provider:  Gracelyn Nurse, MD, at [facility], patient currently at [facility] level of care noted remains extubated today   Patient screened for hospitalization with noted high risk score for unplanned readmission risk and  to assess for potential [facility]  [[facility]] Care Management service needs for post hospital transition.    Plan:  Continue to follow progress and disposition to assess for post hospital care management needs.    For questions contact:   Charlesetta Shanks, RN BSN CCM [facility]  [phone] business mobile phone Toll free office [phone]  Fax number: [phone] [email or web address]

## 2021-08-28 NOTE — Progress Notes (Signed)
Nutrition Follow Up Note   DOCUMENTATION CODES:   Non-severe (moderate) malnutrition in context of chronic illness  INTERVENTION:   Change to Vital 1.5@60ml /hr- Initiate at 6m/hr and increase by 122mhr q 12 hours until goal rate is reached.   Free water flushes 3034m4 hours to maintain tube patency   Regimen provides 2160kcal/day, 97g/day protein and 1280m65my of free water.   Pt at high refeed risk; recommend monitor potassium, magnesium and phosphorus labs daily until stable  RD will add supplements once pt's diet is advanced   NUTRITION DIAGNOSIS:   Moderate Malnutrition related to chronic illness (CHF, advanced age) as evidenced by moderate fat depletion, moderate muscle depletion, severe muscle depletion.  GOAL:   Patient will meet greater than or equal to 90% of their needs -not met   MONITOR:   Diet advancement, Labs, Weight trends, TF tolerance, Skin, I & O's  ASSESSMENT:   86 y21 male with h/o CHF, HTN, DM, PAF, HLD and dementia who is admitted with septic shock, CAP, UTI, AKI and NSTEMI.  Pt extubated today. NGT remains in place. Plan is for BSE today. Will plan to resume tube feeds via NGT until pt is able to take in substantial oral intake. RD will add supplements once pt's diet advanced. Pt remains at high refeed risk. Tube feeds have remained at 20ml24msince admission. Pt's abdomen remains distended. Pt has not had a BM today. No vomiting noted. Will try to advance tube feeds to goal rate over the next 48 hrs. Per chart, pt is up ~25lbs since admission but appears to be up ~15lbs from his UBW. Pt +57ml 50mis I & Os.   Medications reviewed and include: colace, insulin, solu-medrol, protonix, miralax, levophed   Labs reviewed: K 3.8 wnl, BUN 70(H), Mg 2.4 wnl  Hgb 8.2(L), Hct 25.5(L) Cbgs- 185, 211 x 24 hrs  Diet Order:   Diet Order             Diet NPO time specified  Diet effective now                  EDUCATION NEEDS:   No education needs  have been identified at this time  Skin:  Skin Assessment: Reviewed RN Assessment (ecchymosis)  Last BM:  7/26- type 7  Height:   Ht Readings from Last 1 Encounters:  08/27/21 5' 10"  (1.778 m)    Weight:   Wt Readings from Last 1 Encounters:  08/28/21 84 kg    Ideal Body Weight:  75.45 kg  BMI:  Body mass index is 26.57 kg/m.  Estimated Nutritional Needs:   Kcal:  2000-2300kcal/day  Protein:  100-115g/day  Fluid:  1.9-2.2L/day  Asuna Peth Koleen DistanceD, LDN Please refer to [facility] and/or RD on-call/weekend/after hours pager

## 2021-08-28 NOTE — IPAL (Signed)
  Interdisciplinary Goals of Care Family Meeting   Date carried out: 08/28/2021  Location of the meeting: Bedside  Member's involved: Physician, Nurse Practitioner, and Family Member or next of kin     GOALS OF CARE DISCUSSION  The Clinical status was relayed to family in detail-  Updated and notified of patients medical condition- Explained to family course of therapy and the modalities   PATIENT REMAINS DNR STATUS  Family understands the situation. PLAN FOR ONE WAY EXTUBATION TODAY CONTINUE MEDICAL MANAGEMENT  They have consented and agreed to DNR/DNI   Family are satisfied with Plan of action and management. All questions answered  Additional CC time 25 mins   Abdulah Iqbal Santiago Glad, M.D.  [facility]  Medical Director [facility] Medical Director [facility] Cardio-Pulmonary Department

## 2021-08-29 ENCOUNTER — Telehealth (HOSPITAL_COMMUNITY): Payer: Self-pay | Admitting: Pharmacy Technician

## 2021-08-29 ENCOUNTER — Other Ambulatory Visit (HOSPITAL_COMMUNITY): Payer: Self-pay

## 2021-08-29 DIAGNOSIS — J189 Pneumonia, unspecified organism: Secondary | ICD-10-CM | POA: Diagnosis not present

## 2021-08-29 DIAGNOSIS — J9601 Acute respiratory failure with hypoxia: Secondary | ICD-10-CM | POA: Diagnosis not present

## 2021-08-29 DIAGNOSIS — E440 Moderate protein-calorie malnutrition: Secondary | ICD-10-CM | POA: Diagnosis not present

## 2021-08-29 DIAGNOSIS — A419 Sepsis, unspecified organism: Secondary | ICD-10-CM | POA: Diagnosis not present

## 2021-08-29 LAB — CBC
HCT: 27 % — ABNORMAL LOW (ref 39.0–52.0)
Hemoglobin: 9.1 g/dL — ABNORMAL LOW (ref 13.0–17.0)
MCH: 30.7 pg (ref 26.0–34.0)
MCHC: 33.7 g/dL (ref 30.0–36.0)
MCV: 91.2 fL (ref 80.0–100.0)
Platelets: 171 10*3/uL (ref 150–400)
RBC: 2.96 MIL/uL — ABNORMAL LOW (ref 4.22–5.81)
RDW: 15.4 % (ref 11.5–15.5)
WBC: 8 10*3/uL (ref 4.0–10.5)
nRBC: 0 % (ref 0.0–0.2)

## 2021-08-29 LAB — GLUCOSE, CAPILLARY
Glucose-Capillary: 137 mg/dL — ABNORMAL HIGH (ref 70–99)
Glucose-Capillary: 152 mg/dL — ABNORMAL HIGH (ref 70–99)
Glucose-Capillary: 214 mg/dL — ABNORMAL HIGH (ref 70–99)
Glucose-Capillary: 217 mg/dL — ABNORMAL HIGH (ref 70–99)
Glucose-Capillary: 254 mg/dL — ABNORMAL HIGH (ref 70–99)
Glucose-Capillary: 73 mg/dL (ref 70–99)

## 2021-08-29 LAB — BASIC METABOLIC PANEL
Anion gap: 7 (ref 5–15)
BUN: 65 mg/dL — ABNORMAL HIGH (ref 8–23)
CO2: 26 mmol/L (ref 22–32)
Calcium: 9 mg/dL (ref 8.9–10.3)
Chloride: 110 mmol/L (ref 98–111)
Creatinine, Ser: 0.93 mg/dL (ref 0.61–1.24)
GFR, Estimated: >60 mL/min (ref >60)
Glucose, Bld: 157 mg/dL — ABNORMAL HIGH (ref 70–99)
Potassium: 3.6 mmol/L (ref 3.5–5.1)
Sodium: 143 mmol/L (ref 135–145)

## 2021-08-29 LAB — PROTIME-INR
INR: 2.4 — ABNORMAL HIGH (ref 0.8–1.2)
Prothrombin Time: 26.1 seconds — ABNORMAL HIGH (ref 11.4–15.2)

## 2021-08-29 LAB — PHOSPHORUS: Phosphorus: 2.7 mg/dL (ref 2.5–4.6)

## 2021-08-29 LAB — MAGNESIUM: Magnesium: 2.4 mg/dL (ref 1.7–2.4)

## 2021-08-29 MED ORDER — ADULT MULTIVITAMIN W/MINERALS CH
1.0000 | ORAL_TABLET | Freq: Every day | ORAL | Status: DC
  Administered 2021-08-30 – 2021-09-01 (×3): 1 via ORAL
  Filled 2021-08-29 (×3): qty 1

## 2021-08-29 MED ORDER — ENSURE MAX PROTEIN PO LIQD
11.0000 [oz_av] | Freq: Three times a day (TID) | ORAL | Status: DC
  Administered 2021-08-29 – 2021-08-31 (×6): 11 [oz_av] via ORAL
  Filled 2021-08-29: qty 330

## 2021-08-29 MED ORDER — ASCORBIC ACID 500 MG PO TABS
500.0000 mg | ORAL_TABLET | Freq: Two times a day (BID) | ORAL | Status: DC
  Administered 2021-08-29 – 2021-09-01 (×6): 500 mg via ORAL
  Filled 2021-08-29 (×6): qty 1

## 2021-08-29 MED ORDER — GUAIFENESIN-DM 100-10 MG/5ML PO SYRP
10.0000 mL | ORAL_SOLUTION | ORAL | Status: DC | PRN
Start: 2021-08-29 — End: 2021-09-01

## 2021-08-29 MED ORDER — POLYETHYLENE GLYCOL 3350 17 G PO PACK
17.0000 g | PACK | Freq: Every day | ORAL | Status: DC
  Administered 2021-08-30 – 2021-08-31 (×2): 17 g via ORAL
  Filled 2021-08-29 (×2): qty 1

## 2021-08-29 MED ORDER — VITAMIN B-12 1000 MCG PO TABS
1000.0000 ug | ORAL_TABLET | Freq: Every day | ORAL | Status: DC
  Administered 2021-08-29 – 2021-09-01 (×4): 1000 ug via ORAL
  Filled 2021-08-29 (×5): qty 1

## 2021-08-29 MED ORDER — ENSURE ENLIVE PO LIQD: 237.0000 mL | Freq: Three times a day (TID) | ORAL | Status: DC

## 2021-08-29 MED ORDER — DOCUSATE SODIUM 50 MG/5ML PO LIQD
50.0000 mg | Freq: Two times a day (BID) | ORAL | Status: DC
  Administered 2021-08-29 – 2021-08-31 (×5): 50 mg via ORAL
  Filled 2021-08-29 (×6): qty 10

## 2021-08-29 MED ORDER — PIOGLITAZONE HCL 15 MG PO TABS
15.0000 mg | ORAL_TABLET | Freq: Every day | ORAL | Status: DC
  Administered 2021-08-29 – 2021-08-30 (×2): 15 mg via ORAL
  Filled 2021-08-29 (×2): qty 1

## 2021-08-29 MED ORDER — AMLODIPINE BESYLATE 5 MG PO TABS
2.5000 mg | ORAL_TABLET | Freq: Every day | ORAL | Status: DC
  Administered 2021-08-29 – 2021-09-01 (×4): 2.5 mg via ORAL
  Filled 2021-08-29 (×4): qty 1

## 2021-08-29 MED ORDER — WARFARIN - PHARMACIST DOSING INPATIENT: Freq: Every day | Status: DC

## 2021-08-29 MED ORDER — WARFARIN SODIUM 2.5 MG PO TABS
2.5000 mg | ORAL_TABLET | Freq: Once | ORAL | Status: AC
Start: 2021-08-29 — End: 2021-08-29
  Administered 2021-08-29: 2.5 mg via ORAL
  Filled 2021-08-29: qty 1

## 2021-08-29 MED ORDER — LISINOPRIL 20 MG PO TABS
20.0000 mg | ORAL_TABLET | Freq: Every day | ORAL | Status: DC
  Administered 2021-08-29 – 2021-09-01 (×4): 20 mg via ORAL
  Filled 2021-08-29 (×4): qty 1

## 2021-08-29 MED ORDER — POLYETHYLENE GLYCOL 3350 17 G PO PACK: 17.0000 g | PACK | Freq: Every day | ORAL | Status: DC | PRN

## 2021-08-29 MED ORDER — IPRATROPIUM-ALBUTEROL 0.5-2.5 (3) MG/3ML IN SOLN
3.0000 mL | Freq: Three times a day (TID) | RESPIRATORY_TRACT | Status: DC
  Administered 2021-08-29 – 2021-09-01 (×7): 3 mL via RESPIRATORY_TRACT
  Filled 2021-08-29 (×7): qty 3

## 2021-08-29 MED ORDER — GLIMEPIRIDE 4 MG PO TABS
4.0000 mg | ORAL_TABLET | Freq: Every day | ORAL | Status: DC
Start: 2021-08-29 — End: 2021-09-01
  Administered 2021-08-29 – 2021-09-01 (×4): 4 mg via ORAL
  Filled 2021-08-29 (×4): qty 1

## 2021-08-29 NOTE — Evaluation (Signed)
Clinical/Bedside Swallow Evaluation Patient Details  Name: Andrew [name] MRN: [id number] Date of Birth: 07/10/1935  Today's Date: 08/29/2021 Time: SLP Start Time (ACUTE ONLY): 1236 SLP Stop Time (ACUTE ONLY): 1248 SLP Time Calculation (min) (ACUTE ONLY): 12 min  Past Medical History:  Past Medical History:  Diagnosis Date   CHF (congestive heart failure) (HCC)    Diabetes mellitus without complication (HCC)    Essential hypertension    Mixed hyperlipidemia    Permanent atrial fibrillation (HCC)    a. s/p catheter ablation ~ 12 yrs ago @ [facility] per family->unsuccessful; b. CHA2DS2VASc = 5-->chronic coumadin.   Past Surgical History:  Past Surgical History:  Procedure Laterality Date   APPENDECTOMY     INSERT / REPLACE / REMOVE PACEMAKER     PACEMAKER IMPLANT N/A 03/25/2018   Procedure: PACEMAKER IMPLANT;  Surgeon: Hillis Range, MD;  Location: [facility] INVASIVE CV LAB;  Service: Cardiovascular;  Laterality: N/A;   HPI:  86 yo M presenting to [facility] ED from home via EMS with complaints of dyspnea, hypoxia & fatigue on 08/22/2021.  Chest x-ray revealed 08/22/2021 Bilateral multifocal airspace opacities concerning for multifocal  pneumonia. Chest x-ray on 08/27/2021 revealed Similar enlarged cardiac silhouette and central vascular prominence, with bilateral interstitial and alveolar opacities. Pt intubated on 08/23/2021 thru 08/28/2021.    Assessment / Plan / Recommendation  Clinical Impression  Pt presents with the appearance of adequate oropharyngeal abilities when consuming puree and thin liquids via cup. Given pt's fast rate of consumption, altered mentation and deconditioned state, recommend conservative diet of dysphagia 1 with thin liquids via cup. ST to follow for further diet advancement. SLP Visit Diagnosis: Dysphagia, oropharyngeal phase (R13.12)    Aspiration Risk  Mild aspiration risk    Diet Recommendation Dysphagia 1 (Puree);Thin liquid   Liquid Administration via: Cup;No  straw Medication Administration: Crushed with puree Supervision: Full supervision/cueing for compensatory strategies;Staff to assist with self feeding Compensations: Minimize environmental distractions;Slow rate;Small sips/bites Postural Changes: Seated upright at 90 degrees;Remain upright for at least 30 minutes after po intake    Other  Recommendations Oral Care Recommendations: Oral care BID    Recommendations for follow up therapy are one component of a multi-disciplinary discharge planning process, led by the attending physician.  Recommendations may be updated based on patient status, additional functional criteria and insurance authorization.  Follow up Recommendations Skilled nursing-short term rehab (<3 hours/day)      Assistance Recommended at Discharge Frequent or constant Supervision/Assistance  Functional Status Assessment Patient has had a recent decline in their functional status and/or demonstrates limited ability to make significant improvements in function in a reasonable and predictable amount of time  Frequency and Duration min 2x/week  2 weeks       Prognosis Prognosis for Safe Diet Advancement: Fair Barriers to Reach Goals: Cognitive deficits;Severity of deficits;Time post onset;Behavior      Swallow Study   General Date of Onset: 08/22/21 HPI: 86 yo M presenting to [facility] ED from home via EMS with complaints of dyspnea, hypoxia & fatigue on 08/22/2021.  Chest x-ray revealed 08/22/2021 Bilateral multifocal airspace opacities concerning for multifocal  pneumonia. Chest x-ray on 08/27/2021 revealed Similar enlarged cardiac silhouette and central vascular prominence, with bilateral interstitial and alveolar opacities. Pt intubated on 08/23/2021 thru 08/28/2021. Type of Study: Bedside Swallow Evaluation Previous Swallow Assessment: BSE - 10/2018 - recommended regular diet with thin liquids Diet Prior to this Study: Dysphagia 1 (puree);Thin liquids Temperature Spikes  Noted: No Respiratory Status: Room air  History of Recent Intubation: Yes Length of Intubations (days): 5 days Date extubated: 08/28/21 Behavior/Cognition: Lethargic/Drowsy Oral Cavity Assessment: Within Functional Limits Oral Care Completed by SLP: No Oral Cavity - Dentition: Dentures, top;Dentures, bottom (pt refusing to wear at this time) Self-Feeding Abilities: Total assist Patient Positioning: Upright in bed Baseline Vocal Quality: Low vocal intensity Volitional Cough: Cognitively unable to elicit Volitional Swallow: Unable to elicit    Oral/Motor/Sensory Function Overall Oral Motor/Sensory Function: Generalized oral weakness   Ice Chips Ice chips: Within functional limits Presentation: Spoon   Thin Liquid Thin Liquid: Within functional limits Presentation: Cup    Nectar Thick Nectar Thick Liquid: Not tested   Honey Thick Honey Thick Liquid: Not tested   Puree Puree: Within functional limits   Solid     Solid: Not tested     Andrew [name] B. Dreama Saa, M.S., CCC-SLP, Tree surgeon Certified Brain Injury Specialist [facility]  [facility] Services Office [phone] Ascom [phone] Fax [phone]

## 2021-08-29 NOTE — NC FL2 (Signed)
[place] MEDICAID FL2 LEVEL OF CARE SCREENING TOOL     IDENTIFICATION  Patient Name: Andrew [name] Birthdate: Sep 19, 1935 Sex: male Admission Date (Current Location): 08/22/2021  [facility] and [place] Number:  Chiropodist and Address:  [facility], [address] [place], [place] [address]      Provider Number: [id number]  Attending Physician Name and Address:  Enedina Finner, MD  Relative Name and Phone Number:  Cleave Ternes wife- [phone]    Current Level of Care: Hospital Recommended Level of Care: Skilled Nursing Facility Prior Approval Number:    Date Approved/Denied:   PASRR Number: [id number]  Discharge Plan: SNF    Current Diagnoses: Patient Active Problem List   Diagnosis Date Noted   Malnutrition of moderate degree 08/29/2021   Shock (HCC)    Acute respiratory failure (HCC) 08/22/2021   AKI (acute kidney injury) (HCC) 05/20/2021   Chronic anticoagulation 05/20/2021   Anemia 05/20/2021   Acute respiratory failure with hypoxia (HCC) 05/20/2021   CAP (community acquired pneumonia) 05/20/2021   SOB (shortness of breath) 05/19/2021   Severe sepsis (HCC) 10/06/2018   Chronic diastolic (congestive) heart failure (HCC) 04/01/2018   HTN (hypertension) 04/01/2018   DM (diabetes mellitus) (HCC) 04/01/2018   Paroxysmal atrial fibrillation (HCC) 04/01/2018   Symptomatic bradycardia 03/25/2018   Acute CHF (congestive heart failure) (HCC) 03/23/2018    Orientation RESPIRATION BLADDER Height & Weight     Self  Normal Continent Weight: 80.9 kg Height:  5\' 10"  (177.8 cm)  BEHAVIORAL SYMPTOMS/MOOD NEUROLOGICAL BOWEL NUTRITION STATUS      Continent Diet (see discharge summary)  AMBULATORY STATUS COMMUNICATION OF NEEDS Skin   Extensive Assist Verbally PU Stage and Appropriate Care (deep tissure injury sacrum)                       Personal Care Assistance Level of Assistance  Bathing, Feeding, Dressing Bathing Assistance:  Maximum assistance Feeding assistance: Limited assistance Dressing Assistance: Maximum assistance     Functional Limitations Info  Sight, Hearing, Speech Sight Info: Impaired Hearing Info: Adequate Speech Info: Adequate    SPECIAL CARE FACTORS FREQUENCY  PT (By licensed PT), OT (By licensed OT)     PT Frequency: 5 times per week OT Frequency: 5 times per week            Contractures Contractures Info: Not present    Additional Factors Info  Code Status, Allergies Code Status Info: DNR Allergies Info: Methyldopa           Current Medications (08/29/2021):  This is the current hospital active medication list Current Facility-Administered Medications  Medication Dose Route Frequency Provider Last Rate Last Admin   0.9 %  sodium chloride infusion  250 mL Intravenous Continuous Rust-Chester, 08/31/2021, NP   Stopped at 08/23/21 08/25/21   ascorbic acid (VITAMIN C) tablet 500 mg  500 mg Oral BID 7939, MD       budesonide (PULMICORT) nebulizer solution 0.5 mg  0.5 mg Nebulization BID Enedina Finner D, NP   0.5 mg at 08/29/21 08/31/21   Chlorhexidine Gluconate Cloth 2 % PADS 6 each  6 each Topical Daily 0300, MD   6 each at 08/29/21 0725   docusate (COLACE) 50 MG/5ML liquid 50 mg  50 mg Oral BID 08/31/21, MD       donepezil (ARICEPT) tablet 10 mg  10 mg Oral QHS Enedina Finner, MD   10 mg at 08/28/21  2222   guaiFENesin-dextromethorphan (ROBITUSSIN DM) 100-10 MG/5ML syrup 10 mL  10 mL Oral Q4H PRN Enedina Finner, MD       haloperidol lactate (HALDOL) injection 2 mg  2 mg Intravenous Q6H PRN Judithe Modest, NP       insulin aspart (novoLOG) injection 0-20 Units  0-20 Units Subcutaneous Q4H Rust-Chester, Britton L, NP   7 Units at 08/29/21 1139   ipratropium-albuterol (DUONEB) 0.5-2.5 (3) MG/3ML nebulizer solution 3 mL  3 mL Nebulization Q4H PRN Rust-Chester, Britton L, NP       ipratropium-albuterol (DUONEB) 0.5-2.5 (3) MG/3ML nebulizer solution 3 mL  3 mL Nebulization  TID Enedina Finner, MD       memantine [facility]) tablet 10 mg  10 mg Oral QHS Erin Fulling, MD   10 mg at 08/28/21 2222   [START ON 08/30/2021] multivitamin with minerals tablet 1 tablet  1 tablet Oral Daily Enedina Finner, MD       Oral care mouth rinse  15 mL Mouth Rinse PRN Ileana Roup, MD       pantoprazole (PROTONIX) injection 40 mg  40 mg Intravenous Q24H Rust-Chester, Britton L, NP   40 mg at 08/29/21 0928   [START ON 08/30/2021] polyethylene glycol (MIRALAX / GLYCOLAX) packet 17 g  17 g Oral Daily Enedina Finner, MD       polyethylene glycol (MIRALAX / GLYCOLAX) packet 17 g  17 g Oral Daily PRN Enedina Finner, MD       protein supplement (ENSURE MAX) liquid  11 oz Oral TID Enedina Finner, MD       warfarin (COUMADIN) tablet 2.5 mg  2.5 mg Oral ONCE-1600 Tressie Ellis, [facility]       Warfarin - Pharmacist Dosing Inpatient   Does not apply q1600 Tressie Ellis, [facility]         Discharge Medications: Please see discharge summary for a list of discharge medications.  Relevant Imaging Results:  Relevant Lab Results:   Additional Information SS# [id number]  Allayne Butcher, RN

## 2021-08-29 NOTE — Evaluation (Signed)
Occupational Therapy Evaluation Patient Details Name: Andrew [name] MRN: [id number] DOB: 06/26/1935 Today's Date: 08/29/2021   History of Present Illness 86 yo M presenting to [facility] ED from home via EMS with complaints of dyspnea, hypoxia & fatigue.  History provided by the patient, and the patient's wife and daughter who are bedside. The patient was in his normal state of health until Thursday 08/21/21 when the patient developed lower abdominal pain and the "shakes". He was diagnosed with a UTI and started on Keflex. Then on 08/22/21 he took a nap and was awoken by his wife around 4:30 pm to take some medicine. At this time he woke up and was dyspneic, unable to complete a sentence, as well as severely fatigued unable to stand.   Clinical Impression   Pt was seen for OT evaluation this date. Prior to hospital admission, per chart, pt was using a RW for mobility and independent with ADLs with intermittent assistance from wife. Pt lives with spouse in one level house with level entry. Pt presents to acute OT demonstrating impaired ADL performance and functional mobility 2/2 decreased activity tolerance and functional strength/ROM/balance deficits. Pt currently requires MAX A x2 with multimodal cueing for sequencing for bed mobility, STS, and lateral scoot x2 along EOB. Pt demonstrated fair sitting balance with intermittent B UE support - needs max redirection, hand over hand cueing to prevent pulling on lines/leads. Anticipate MAX A for lower body dressing and toileting at bed level. Pt would benefit from skilled OT to address noted impairments and functional limitations (see below for any additional details). Upon hospital discharge, recommend STR to maximize pt safety and return to PLOF.      Recommendations for follow up therapy are one component of a multi-disciplinary discharge planning process, led by the attending physician.  Recommendations may be updated based on patient status, additional  functional criteria and insurance authorization.   Follow Up Recommendations  Skilled nursing-short term rehab (<3 hours/day)    Assistance Recommended at Discharge Frequent or constant Supervision/Assistance  Patient can return home with the following Two people to help with walking and/or transfers;A lot of help with bathing/dressing/bathroom;Assistance with cooking/housework;Assistance with feeding;Direct supervision/assist for medications management;Assist for transportation;Help with stairs or ramp for entrance    Functional Status Assessment  Patient has had a recent decline in their functional status and demonstrates the ability to make significant improvements in function in a reasonable and predictable amount of time.  Equipment Recommendations  BSC/3in1    Recommendations for Other Services       Precautions / Restrictions Precautions Precautions: Fall;ICD/Pacemaker Restrictions Weight Bearing Restrictions: No      Mobility Bed Mobility Overal bed mobility: Needs Assistance Bed Mobility: Supine to Sit, Sit to Supine     Supine to sit: HOB elevated, Max assist, +2 for physical assistance Sit to supine: Max assist, +2 for physical assistance        Transfers Overall transfer level: Needs assistance Equipment used: Rolling walker (2 wheels) Transfers: Sit to/from Stand Sit to Stand: +2 physical assistance, Max assist           General transfer comment: heavy anterior lean      Balance Overall balance assessment: Needs assistance Sitting-balance support: Bilateral upper extremity supported, Feet supported Sitting balance-Leahy Scale: Poor     Standing balance support: Bilateral upper extremity supported Standing balance-Leahy Scale: Poor  ADL either performed or assessed with clinical judgement   ADL Overall ADL's : Needs assistance/impaired                                       General ADL  Comments: Pt demonstrated fair sitting balance with intermittent B UE support - needs max redirection, hand over hand cueing to prevent pulling on lines/leads. Anticipate MAX A for lower body dressing and toielting at bed level.      Pertinent Vitals/Pain Pain Assessment Pain Assessment: Faces Faces Pain Scale: Hurts a little bit     Hand Dominance Right   Extremity/Trunk Assessment Upper Extremity Assessment Upper Extremity Assessment: Generalized weakness RUE Deficits / Details: Shoulder flexion limited to ~90 degrees. Grip symmetrical LUE Deficits / Details: Shoulder flexion limited to ~90 degrees. Grip symmetrical   Lower Extremity Assessment Lower Extremity Assessment: Defer to PT evaluation RLE Deficits / Details: Ankle DF 3/5 MMT, knee extension 3/5, hip flexion 2/5, hip extension ~4/5 supine against manual resistance LLE Deficits / Details: Ankle DF 3/5 MMT, knee extension 3/5, hip flexion 2/5, hip extension ~4/5 supine against manual resistance   Cervical / Trunk Assessment Cervical / Trunk Assessment: Kyphotic   Communication Communication Communication: HOH   Cognition Arousal/Alertness: Awake/alert Behavior During Therapy: WFL for tasks assessed/performed Overall Cognitive Status: History of cognitive impairments - at baseline                                 General Comments: Hx of dementia. Follows single step commands inconsistently. Requires multimodal cueing throughout session.                Home Living Family/patient expects to be discharged to:: Private residence Living Arrangements: Spouse/significant other Available Help at Discharge: Family Type of Home: House Home Access: Level entry     Home Layout: One level     Bathroom Shower/Tub: Walk-in Pensions consultant: Standard     Home Equipment: Shower seat;Grab bars - tub/shower;Cane - single point;Rollator (4 wheels)          Prior Functioning/Environment  Prior Level of Function : Independent/Modified Independent             Mobility Comments: Per RN, spouse stating since previous admission, more reliant on RW for household distances. ADLs Comments: Pt reports performing most of his ADL's on his own but states spouse helps him as needed.        OT Problem List: Decreased strength;Decreased range of motion;Decreased activity tolerance;Impaired balance (sitting and/or standing);Decreased safety awareness      OT Treatment/Interventions: Self-care/ADL training;Therapeutic exercise;Energy conservation;DME and/or AE instruction;Therapeutic activities;Patient/family education;Balance training    OT Goals(Current goals can be found in the care plan section) Acute Rehab OT Goals Patient Stated Goal: to go home OT Goal Formulation: With patient/family Time For Goal Achievement: 09/12/21 Potential to Achieve Goals: Fair ADL Goals Pt Will Perform Grooming: with caregiver independent in assisting;sitting;with min guard assist (with min vcs) Pt Will Perform Lower Body Dressing: sitting/lateral leans;with min assist;with caregiver independent in assisting Pt Will Transfer to Toilet: with min assist;stand pivot transfer;bedside commode (with LRAD and min vcs)  OT Frequency: Min 2X/week       AM-PAC OT "6 Clicks" Daily Activity     Outcome Measure Help from another person eating meals?: A Little Help from another  person taking care of personal grooming?: A Lot Help from another person toileting, which includes using toliet, bedpan, or urinal?: A Lot Help from another person bathing (including washing, rinsing, drying)?: A Lot Help from another person to put on and taking off regular upper body clothing?: A Lot Help from another person to put on and taking off regular lower body clothing?: A Lot 6 Click Score: 13   End of Session Equipment Utilized During Treatment: Other (comment) (none) Nurse Communication: Mobility status  Activity  Tolerance: Patient tolerated treatment well Patient left: in bed;with call bell/phone within reach;with bed alarm set;with family/visitor present  OT Visit Diagnosis: Muscle weakness (generalized) (M62.81);Unsteadiness on feet (R26.81)                Time: 1008-1030 OT Time Calculation (min): 22 min Charges:  OT General Charges $OT Visit: 1 Visit OT Evaluation $OT Eval Moderate Complexity: 1 Mod OT Treatments $Self Care/Home Management : 8-22 mins  [employer], OTDS  [employer] 08/29/2021, 1:18 PM

## 2021-08-29 NOTE — Progress Notes (Signed)
Nutrition Follow Up Note   DOCUMENTATION CODES:   Non-severe (moderate) malnutrition in context of chronic illness  INTERVENTION:   Ensure Max protein supplement TID, each supplement provides 150kcal and 30g of protein.  Magic cup TID with meals, each supplement provides 290 kcal and 9 grams of protein  MVI po daily   Vitamin C 500mg po BID  Assist with meals   NUTRITION DIAGNOSIS:   Moderate Malnutrition related to chronic illness (CHF, advanced age) as evidenced by moderate fat depletion, moderate muscle depletion, severe muscle depletion.  GOAL:   Patient will meet greater than or equal to 90% of their needs -not met   MONITOR:   PO intake, Supplement acceptance, Labs, Weight trends, Skin, I & O's  ASSESSMENT:   86 y/o male with h/o CHF, HTN, DM, PAF, HLD and dementia who is admitted with septic shock, CAP, UTI, AKI and NSTEMI.  Visited pt's room today. Pt seen by SLP today and initiated on a dysphagia 1/thin liquid diet. Pt ate 60% of breakfast this morning with meal assistance from RN. NGT removed. RD will add supplements and vitamins to help pt meet his estimated needs and to support wound healing. Per wife report, pt drinks chocolate supplements at home with 30g protein and 1g sugar. RD will add Ensure Max. Per chart, pt is up ~18lbs since admission. Pt -82ml on his I & Os.   Medications reviewed and include: colace, insulin, protonix, miralax  Labs reviewed: K 3.6 wnl, BUN 65(H), P 2.7 wnl, Mg 2.4 wnl  Hgb 9.1(L), Hct 27.0(L) Cbgs- 152, 137 x 24 hrs  Diet Order:   Diet Order             DIET - DYS 1 Room service appropriate? Yes; Fluid consistency: Thin  Diet effective now                  EDUCATION NEEDS:   No education needs have been identified at this time  Skin:  Skin Assessment: Reviewed RN Assessment (ecchymosis, DTI sacrum)  Last BM:  7/28- type 7  Height:   Ht Readings from Last 1 Encounters:  08/27/21 5' 10" (1.778 m)    Weight:    Wt Readings from Last 1 Encounters:  08/29/21 80.9 kg    Ideal Body Weight:  75.45 kg  BMI:  Body mass index is 25.59 kg/m.  Estimated Nutritional Needs:   Kcal:  2000-2300kcal/day  Protein:  100-115g/day  Fluid:  1.9-2.2L/day    MS, RD, LDN Please refer to AMION for RD and/or RD on-call/weekend/after hours pager 

## 2021-08-29 NOTE — Consult Note (Addendum)
ANTICOAGULATION CONSULT NOTE  Pharmacy Consult for Warfarin Indication: atrial fibrillation  Patient Measurements: Height: 5\' 10"  (177.8 cm) Weight: 80.9 kg (178 lb 5.6 oz) IBW/kg (Calculated) : 73  Labs: Recent Labs    08/27/21 0235 08/28/21 0521 08/29/21 0513  HGB  --  8.2* 9.1*  HCT  --  25.5* 27.0*  PLT  --  189 171  LABPROT 33.0* 32.3* 26.1*  INR 3.3* 3.2* 2.4*  CREATININE 1.11 1.00 0.93    Estimated Creatinine Clearance: 61.1 mL/min (by C-G formula based on SCr of 0.93 mg/dL).   Medical History: Past Medical History:  Diagnosis Date   CHF (congestive heart failure) (HCC)    Diabetes mellitus without complication (HCC)    Essential hypertension    Mixed hyperlipidemia    Permanent atrial fibrillation (HCC)    a. s/p catheter ablation ~ 12 yrs ago @ [facility] per family->unsuccessful; b. CHA2DS2VASc = 5-->chronic coumadin.    Medications:  Warfarin 2.5 mg MoWeFrSa and 1.25 mg on TuThSu (TWD 13.75 mg)  Assessment: Patient is a 86 y/o M with medical history including permanent Afib on warfarin who was admitted 7/21 after presenting to the ED from home with SOB. Patient ultimately required intubation from 7/22 thru 7/27 for acute respiratory failure secondary to CAP and pulmonary edema. INR was supratherapeutic on admission to 5.3.   Date INR Plan  7/21 N/A 2.5 mg (presumed, PTA)  7/22 5.3 Held  7/23 4.9 Held  7/24 4.1 Held  7/25 3.3 Held  7/26 3.3 Held (hemoptysis reported)  7/27 3.2 Held (hemoptysis resolved)  7/28 2.4 2.5 mg    Goal of Therapy:  INR 2-3 Monitor platelets by anticoagulation protocol: Yes   Plan:  --Patient's warfarin has been on hold for six days for supratherapeutic INR and bleeding issues. Suspect reason for elevated INR was multifactorial and probably secondary to transient hepatic dysfunction, dietary changes, and drug-drug interactions. Elevated INR at this point is not likely secondary to residual warfarin effect --Will give  warfarin 2.5 mg (home dose) tonight. Given patient's apparent warfarin sensitivity and elevated INRs, will re-start warfarin cautiously to gauge response --Daily INR per protocol --CBC at least every 3 days per protocol  8/28 08/29/2021,10:52 AM

## 2021-08-29 NOTE — TOC Initial Note (Signed)
Transition of Care [facility]) - Initial/Assessment Note    Patient Details  Name: Andrew [name] MRN: [id number] Date of Birth: [birth year]  Transition of Care [facility]) CM/SW Contact:    Shelbie Hutching, RN Phone Number: 08/29/2021, 1:51 PM  Clinical Narrative:                 Patient admitted to the hospital with acute respiratory failure due to community acquired pneumonia.  Patient initially required intubation.  He was extubated yesterday and is tolerating room air.  RNCM met with patient and wife at the bedside.  Patient is from home with wife, independent and walks with a cane.  Wife provides all transportation. Current recommendation is for short term rehab.  Family agrees to short term rehab.  RNCM will start bed search.    Expected Discharge Plan: Skilled Nursing Facility Barriers to Discharge: Continued Medical Work up   Patient Goals and CMS Choice Patient states their goals for this hospitalization and ongoing recovery are:: Family wants the patient to get better and get back home, agrees to rehab CMS Medicare.gov Compare Post Acute Care list provided to:: Patient Represenative (must comment) Choice offered to / list presented to : Spouse  Expected Discharge Plan and Services Expected Discharge Plan: [place]   Discharge Planning Services: CM Consult Post Acute Care Choice: [place] Living arrangements for the past 2 months: Single Family Home                 DME Arranged: N/A DME Agency: NA                  Prior Living Arrangements/Services Living arrangements for the past 2 months: Single Family Home Lives with:: Spouse Patient language and need for interpreter reviewed:: Yes Do you feel safe going back to the place where you live?: Yes      Need for Family Participation in Patient Care: Yes (Comment) Care giver support system in place?: Yes (comment) Current home services: DME (cane and walker) Criminal Activity/Legal  Involvement Pertinent to Current Situation/Hospitalization: No - Comment as needed  Activities of Daily Living Home Assistive Devices/Equipment: Cane (specify quad or straight), Walker (specify type) ADL Screening (condition at time of admission) Patient's cognitive ability adequate to safely complete daily activities?: No Is the patient deaf or have difficulty hearing?: Yes Does the patient have difficulty seeing, even when wearing glasses/contacts?: No Does the patient have difficulty concentrating, remembering, or making decisions?: No Patient able to express need for assistance with ADLs?: Yes Does the patient have difficulty dressing or bathing?: Yes Independently performs ADLs?: Yes (appropriate for developmental age) Does the patient have difficulty walking or climbing stairs?: Yes Weakness of Legs: Both Weakness of Arms/Hands: Both  Permission Sought/Granted Permission sought to share information with : Case Manager, Family Supports, Customer service manager Permission granted to share information with : Yes, Verbal Permission Granted  Share Information with NAME: Orvis Stann  Permission granted to share info w AGENCY: SNF's  Permission granted to share info w Relationship: Spouse  Permission granted to share info w Contact Information: [phone]  Emotional Assessment Appearance:: Appears stated age Attitude/Demeanor/Rapport: Engaged Affect (typically observed): Accepting Orientation: : Oriented to Self Alcohol / Substance Use: Not Applicable Psych Involvement: No (comment)  Admission diagnosis:  Acute respiratory failure ([place]) [J96.00] Severe sepsis ([place]) [A41.9, R65.20] Community acquired pneumonia, unspecified laterality [J18.9] Patient Active Problem List   Diagnosis Date Noted   Malnutrition of moderate degree 08/29/2021  Shock ([place])    Acute respiratory failure ([place]) 08/22/2021   AKI (acute kidney injury) ([place]) 05/20/2021   Chronic anticoagulation  05/20/2021   Anemia 05/20/2021   Acute respiratory failure with hypoxia ([place]) 05/20/2021   CAP (community acquired pneumonia) 05/20/2021   SOB (shortness of breath) 05/19/2021   Severe sepsis ([place]) 10/06/2018   Chronic diastolic (congestive) heart failure ([place]) 04/01/2018   HTN (hypertension) 04/01/2018   DM (diabetes mellitus) ([place]) 04/01/2018   Paroxysmal atrial fibrillation ([place]) 04/01/2018   Symptomatic bradycardia 03/25/2018   Acute CHF (congestive heart failure) ([place]) 03/23/2018   PCP:  Baxter Hire, MD Pharmacy:   [facility] [address], [place] - [place] [address] [place] [address] Phone: [phone] Fax: [phone]     Social Determinants of Health (SDOH) Interventions    Readmission Risk Interventions     No data to display

## 2021-08-29 NOTE — Telephone Encounter (Signed)
Pharmacy Patient Advocate Encounter  Insurance verification completed.    The patient is insured through [employer] Medicare Part D   The patient is currently admitted and ran test claims for the following: Eliquis .  Copays and coinsurance results were relayed to Inpatient clinical team.      

## 2021-08-29 NOTE — Progress Notes (Addendum)
[facility]  - [place] [facility]   PATIENT NAME: Andrew [name]    MR#:  [id number]  DATE OF BIRTH:  [birth year]  SUBJECTIVE:  patient seen earlier in the ICU. Daughter and wife at bedside. Patient has some early dementia/cognitive decline at baseline. Extubated yesterday for acute respiratory failure and multi-lobar pneumonia. Insisting to go home. Speech evaluation done. Patient dysphagia diet. No fever. No respiratory distress.   VITALS:  Blood pressure (!) 142/58, pulse (!) 59, temperature 97.9 F (36.6 C), temperature source Oral, resp. rate 19, height 5\' 10"  (1.778 m), weight 80.9 kg, SpO2 99 %.  PHYSICAL EXAMINATION:   GENERAL:  86 y.o.-year-old patient lying in the bed with no acute distress. Weak LUNGS: Normal breath sounds bilaterally, no wheezing, rales, rhonchi.  CARDIOVASCULAR: S1, S2 normal. No murmurs, rubs, or gallops.  ABDOMEN: Soft, nontender, nondistended. Bowel sounds present.  EXTREMITIES: No  edema b/l. External catheter    NEUROLOGIC: nonfocal  patient is alert and awake some baseline cognitive decline SKIN: per RN   LABORATORY PANEL:  CBC Recent Labs  Lab 08/29/21 0513  WBC 8.0  HGB 9.1*  HCT 27.0*  PLT 171    Chemistries  Recent Labs  Lab 08/22/21 2106 08/23/21 0145 08/29/21 0513  NA  --    < > 143  K  --    < > 3.6  CL  --    < > 110  CO2  --    < > 26  GLUCOSE  --    < > 157*  BUN  --    < > 65*  CREATININE  --    < > 0.93  CALCIUM  --    < > 9.0  MG 2.4   < > 2.4  AST 36  --   --   ALT 16  --   --   ALKPHOS 50  --   --   BILITOT 1.0  --   --    < > = values in this interval not displayed.    Assessment and Plan 86 yo M presenting to [facility] ED from home via EMS with complaints of dyspnea, hypoxia & fatigue. Patient has history of early cognitive decline/dementia, hypertension, diabetes to the emergency fortress of breath and found to have sats in the 50s. He was placed on CPAP with sats 85 to 89%. Eventually  patient got intubated and placed on the ventilator. He is now extubated and sats have improved.   Acute Hypoxic/ Hypercapnic Respiratory Failure secondary to CAP & aspiration pneumonitis, and suspected pulmonary edema  Small volume Hemoptysis, likely in setting of supra-therapeutic INR -- patient now extubated. -- Oxygen saturations 97 to 98% on room air -- change to PO steroid -- status post bronchoscopy 7/26-- showed changes of pneumonia with mucus plugging right lower lobe.  Severe sepsis due to multi-lobar pneumonia and SBO -- patient completed course of Rocephin and doxycycline -- afebrile -- sepsis resolved  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 Elevated Troponin secondary to demand ischemia Chronic Atrial Fibrillation Moderate to severe Aortic Stenosis PMHx: HFpEF, permanent pacemaker, HTN, HLD -- diuresis as BP and renal function permits. Patient currently on room air. Hold off Lasix. -- Will resume lisinopril and amlodipine  Small bowel obstruction-- improved -- NG tube removed. -- Seen by speech therapy patient tolerating dysphagia diet  Acute Kidney Injury in the setting of suspected sepsis ~ IMPROVED Acute Hyponatremia secondary to unknown etiology in the setting of  slight pseudohyponatremia due to hyperglycemia ~ RESOLVED -Monitor I&O's / urinary output -Ensure adequate renal perfusion -Avoid nephrotoxic agents as able -Replace electrolytes as indicated   Type 2 Diabetes Mellitus Steroid Induced Hyperglycemia Hemoglobin A1C: 7.2 - SSI --will resume glipizide and Actos.-- Hold metformin  Acute Metabolic Encephalopathy ~ IMPROVING Dementia - early stages CT Head on admission negative for acute intracranial abnormality -- PT OT to work with patient -- resume Namenda and Aricept TOC for discharge planning-- PT recommends rehab -- appreciate speech therapy input  Nutrition Status: Nutrition Problem: Moderate Malnutrition Etiology: chronic illness  (CHF, advanced age) Signs/Symptoms: moderate fat depletion, moderate muscle depletion, severe muscle depletion follow dietary recommendation    DC Foley, DC central line  Procedures: Family communication : daughter and wife in the room Consults : ICU CODE STATUS: DNR DVT Prophylaxis : warfarin Level of care: Med-Surg Status is: Inpatient Remains inpatient appropriate because: patient transferred out of ICU critically ill for seven days. PT OT to work with patient [facility] for discharge planning.    TOTAL TIME TAKING CARE OF THIS PATIENT: 35 minutes.  >50% time spent on counselling and coordination of care  Note: This dictation was prepared with Dragon dictation along with smaller phrase technology. Any transcriptional errors that result from this process are unintentional.  Enedina Finner M.D    [facility]   CC: Primary care physician; Gracelyn Nurse, MD

## 2021-08-29 NOTE — Evaluation (Signed)
Physical Therapy Evaluation Patient Details Name: Andrew [name] MRN: [id number] DOB: 08-Jan-1936 Today's Date: 08/29/2021  History of Present Illness  86 yo M presenting to [facility] ED from home via EMS with complaints of dyspnea, hypoxia & fatigue.  History provided by the patient, and the patient's wife and daughter who are bedside. The patient was in his normal state of health until Thursday 08/21/21 when the patient developed lower abdominal pain and the "shakes". He was diagnosed with a UTI and started on Keflex. Then on 08/22/21 he took a nap and was awoken by his wife around 4:30 pm to take some medicine. At this time he woke up and was dyspneic, unable to complete a sentence, as well as severely fatigued unable to stand.   Clinical Impression  Pt admitted with above diagnosis. Pt received upright in bed, alert and agreeable to PT eval. Pt very HoH, pleasant and cooperative. Difficulty answering subjective questions, hard to decipher due to hx of dementia or HoH. Does accurately confirm prior home set up as listed in EMR from admission in April. RN present stating pt's spouse current PLOF is more reliant on RW compared to [facility] for household distances. Pt on 2L/min maintaining Spo2 at upper 90's at rest and maintained at 90% or greater with sitting EoB and standing at bedside when receiving accurate pleth wave form. To date, pt generally 3/5 MMT except for hip flexion at 2/5. PROM in ankle, hip, knee globally symmetrical and WFL for needed tasks (I.e. bed mobility, standing, walking stair performance). Pt displaying difficulty following single step commands without hand over hand and multimodal cuing. Hard to decipher due to [facility] or hx of dementia. Pt reliant on bed features and maxA to sit EOB. Noted posterior lean in sitting reliant on LE and UE placement and multimodal cuing for upright sitting. Able to tolerate seated therex with visual cues with fair carryover. Difficulty maintaining seated balance with  seated therex requiring frequent, close SBA to minA at torso throughout. Able to stand x1 to RW with minA+2 with RN. Limited hip and knee extension despite max VC's/TC's on pelvis and shoulder with heavy UE use needed on RW. Tolerating ~30-45 sec in standing while NT changes linens on bed. Deferred transfer or gait due to difficulty with standing. Returned to sitting requiring maxA+2 to return to bed in care of RN and NT for pericare. Pt displaying global weakness limiting pt's ability to successfully perform bed mobility, transfers, and gait which is below pt's baseline. Pt currently with functional limitations due to the deficits listed below (see PT Problem List). Pt will benefit from skilled PT to increase their independence and safety with mobility to allow discharge to the venue listed below.     Recommendations for follow up therapy are one component of a multi-disciplinary discharge planning process, led by the attending physician.  Recommendations may be updated based on patient status, additional functional criteria and insurance authorization.  Follow Up Recommendations Skilled nursing-short term rehab (<3 hours/day) Can patient physically be transported by private vehicle: No    Assistance Recommended at Discharge Frequent or constant Supervision/Assistance  Patient can return home with the following  Two people to help with walking and/or transfers;Two people to help with bathing/dressing/bathroom;Assistance with cooking/housework;Assist for transportation    Equipment Recommendations Other (comment) (tbd by next venue of care)  Recommendations for Other Services       Functional Status Assessment Patient has had a recent decline in their functional status and demonstrates the ability  to make significant improvements in function in a reasonable and predictable amount of time.     Precautions / Restrictions Precautions Precautions: Fall;ICD/Pacemaker Restrictions Weight Bearing  Restrictions: No      Mobility  Bed Mobility Overal bed mobility: Needs Assistance Bed Mobility: Supine to Sit, Sit to Supine     Supine to sit: HOB elevated, Max assist Sit to supine: Max assist, +2 for physical assistance   General bed mobility comments: maxA effort due to difficulty following commands as pt has hard time sequencing UE's/LE's despite hand over hand and multimodal cues Patient Response: Cooperative  Transfers Overall transfer level: Needs assistance Equipment used: Rolling walker (2 wheels) Transfers: Sit to/from Stand Sit to Stand: Min assist, +2 physical assistance, From elevated surface           General transfer comment: Difficulty reaching full knee and hip extension in standing, overall kyphotic and hunched posture with heavy use of UE's on RW.    Ambulation/Gait               General Gait Details: deferred due to difficulty standing  Stairs            Wheelchair Mobility    Modified Rankin (Stroke Patients Only)       Balance Overall balance assessment: Needs assistance Sitting-balance support: Bilateral upper extremity supported, Feet supported Sitting balance-Leahy Scale: Poor Sitting balance - Comments: reliant on hand over hand positions of UE's/LE's to assist in static sitting balance. minA to close SBA throughout. Postural control: Posterior lean Standing balance support: Bilateral upper extremity supported, During functional activity, Reliant on assistive device for balance Standing balance-Leahy Scale: Poor Standing balance comment: two person minA to stand at RW.                             Pertinent Vitals/Pain Pain Assessment Pain Assessment: Faces Faces Pain Scale: Hurts a little bit Pain Location: Unable to specify location Pain Intervention(s): Monitored during session, Repositioned    Home Living Family/patient expects to be discharged to:: Private residence Living Arrangements:  Spouse/significant other Available Help at Discharge: Family Type of Home: House Home Access: Level entry       Home Layout: One level Home Equipment: Shower seat;Grab bars - tub/shower;Cane - single point;Rollator (4 wheels)      Prior Function Prior Level of Function : Independent/Modified Independent             Mobility Comments: Per RN, spouse stating since previous admission, more reliant on RW for household distances. ADLs Comments: Pt reports performing most of his ADL's on his own but states spouse helps him as needed.     Hand Dominance   Dominant Hand: Right    Extremity/Trunk Assessment   Upper Extremity Assessment Upper Extremity Assessment: Generalized weakness;RUE deficits/detail;LUE deficits/detail RUE Deficits / Details: Shoulder flexion limited to ~90 degrees. Grip symmetrical LUE Deficits / Details: Shoulder flexion limited to ~90 degrees. Grip symmetrical    Lower Extremity Assessment Lower Extremity Assessment: Generalized weakness;RLE deficits/detail;LLE deficits/detail RLE Deficits / Details: Ankle DF 3/5 MMT, knee extension 3/5, hip flexion 2/5, hip extension ~4/5 supine against manual resistance LLE Deficits / Details: Ankle DF 3/5 MMT, knee extension 3/5, hip flexion 2/5, hip extension ~4/5 supine against manual resistance    Cervical / Trunk Assessment Cervical / Trunk Assessment: Kyphotic  Communication   Communication: HOH  Cognition Arousal/Alertness: Awake/alert Behavior During Therapy: WFL for tasks assessed/performed Overall Cognitive  Status: History of cognitive impairments - at baseline                                 General Comments: Hx of dementia. Follows single step commands inconsistently. HArd to decipher due to cognition or due to being [place]. IMproved command following with multimodal cuing        General Comments General comments (skin integrity, edema, etc.): SPo2 >/= 90% with mobility. Intermittent poor  pleth reading without O2 reading. HR in mid 70's throughout activity.    Exercises General Exercises - Lower Extremity Ankle Circles/Pumps: AROM, Strengthening, Both, 10 reps, Supine Long Arc Quad: AROM, Supine, Both, 10 reps, Seated Hip Flexion/Marching: AROM, Strengthening, Both, 5 reps, Seated Other Exercises Other Exercises: Role of PT in acute setting, bed mobility, safe use of RW in standing.   Assessment/Plan    PT Assessment Patient needs continued PT services  PT Problem List Decreased strength;Decreased mobility;Decreased safety awareness;Decreased activity tolerance;Decreased cognition;Decreased knowledge of use of DME;Decreased balance       PT Treatment Interventions DME instruction;Therapeutic exercise;Gait training;Balance training;Neuromuscular re-education;Functional mobility training;Therapeutic activities;Patient/family education    PT Goals (Current goals can be found in the Care Plan section)  Acute Rehab PT Goals PT Goal Formulation: Patient unable to participate in goal setting    Frequency Min 2X/week     Co-evaluation               AM-PAC PT "6 Clicks" Mobility  Outcome Measure Help needed turning from your back to your side while in a flat bed without using bedrails?: A Lot Help needed moving from lying on your back to sitting on the side of a flat bed without using bedrails?: A Lot Help needed moving to and from a bed to a chair (including a wheelchair)?: Total Help needed standing up from a chair using your arms (e.g., wheelchair or bedside chair)?: A Lot Help needed to walk in hospital room?: Total Help needed climbing 3-5 steps with a railing? : Total 6 Click Score: 9    End of Session Equipment Utilized During Treatment: Gait belt;Oxygen Activity Tolerance: Patient tolerated treatment well Patient left: in bed;with nursing/sitter in room Nurse Communication: Mobility status PT Visit Diagnosis: Other abnormalities of gait and mobility  (R26.89);Muscle weakness (generalized) (M62.81);Difficulty in walking, not elsewhere classified (R26.2)    Time: 1607-3710 PT Time Calculation (min) (ACUTE ONLY): 23 min   Charges:   PT Evaluation $PT Eval Moderate Complexity: 1 Mod PT Treatments $Therapeutic Exercise: 8-22 mins      Leronda Lewers M. Fairly IV, PT, DPT Physical Therapist- [place]  [facility]  08/29/2021, 9:57 AM

## 2021-08-29 NOTE — TOC Benefit Eligibility Note (Signed)
Patient Advocate Encounter  Insurance verification completed.    The patient is currently admitted and upon discharge could be taking Eliquis 5 mg.  The current 30 day co-pay is $45.00.   The patient is insured through [employer] Medicare Part D     Riah Kehoe, CPhT Pharmacy Patient Advocate Specialist [facility] Patient Advocate Team Direct Number: [phone]  Fax: [phone]        

## 2021-08-30 DIAGNOSIS — A419 Sepsis, unspecified organism: Secondary | ICD-10-CM | POA: Diagnosis not present

## 2021-08-30 DIAGNOSIS — E440 Moderate protein-calorie malnutrition: Secondary | ICD-10-CM | POA: Diagnosis not present

## 2021-08-30 DIAGNOSIS — J189 Pneumonia, unspecified organism: Secondary | ICD-10-CM | POA: Diagnosis not present

## 2021-08-30 DIAGNOSIS — J9601 Acute respiratory failure with hypoxia: Secondary | ICD-10-CM | POA: Diagnosis not present

## 2021-08-30 LAB — CULTURE, BAL-QUANTITATIVE W GRAM STAIN: Culture: 80000 — AB

## 2021-08-30 LAB — GLUCOSE, CAPILLARY
Glucose-Capillary: 117 mg/dL — ABNORMAL HIGH (ref 70–99)
Glucose-Capillary: 126 mg/dL — ABNORMAL HIGH (ref 70–99)
Glucose-Capillary: 129 mg/dL — ABNORMAL HIGH (ref 70–99)
Glucose-Capillary: 217 mg/dL — ABNORMAL HIGH (ref 70–99)
Glucose-Capillary: 221 mg/dL — ABNORMAL HIGH (ref 70–99)
Glucose-Capillary: 234 mg/dL — ABNORMAL HIGH (ref 70–99)
Glucose-Capillary: 69 mg/dL — ABNORMAL LOW (ref 70–99)
Glucose-Capillary: 95 mg/dL (ref 70–99)

## 2021-08-30 LAB — PROTIME-INR
INR: 1.9 — ABNORMAL HIGH (ref 0.8–1.2)
Prothrombin Time: 22 seconds — ABNORMAL HIGH (ref 11.4–15.2)

## 2021-08-30 LAB — PHOSPHORUS: Phosphorus: 2.3 mg/dL — ABNORMAL LOW (ref 2.5–4.6)

## 2021-08-30 MED ORDER — ACETAMINOPHEN 325 MG PO TABS: 650.0000 mg | ORAL_TABLET | Freq: Four times a day (QID) | ORAL | Status: DC | PRN

## 2021-08-30 MED ORDER — QUETIAPINE FUMARATE 25 MG PO TABS
25.0000 mg | ORAL_TABLET | Freq: Every day | ORAL | Status: DC
  Administered 2021-08-30 – 2021-08-31 (×2): 25 mg via ORAL
  Filled 2021-08-30 (×2): qty 1

## 2021-08-30 MED ORDER — DOXAZOSIN MESYLATE 4 MG PO TABS
4.0000 mg | ORAL_TABLET | Freq: Every evening | ORAL | Status: DC
  Administered 2021-08-31 (×2): 4 mg via ORAL
  Filled 2021-08-30: qty 1

## 2021-08-30 MED ORDER — PANTOPRAZOLE SODIUM 40 MG PO TBEC
40.0000 mg | DELAYED_RELEASE_TABLET | Freq: Every day | ORAL | Status: DC
  Administered 2021-08-31 – 2021-09-01 (×2): 40 mg via ORAL
  Filled 2021-08-30 (×2): qty 1

## 2021-08-30 NOTE — TOC Progression Note (Addendum)
Transition of Care [facility]) - Progression Note    Patient Details  Name: NYXON STRUPP MRN: [id number] Date of Birth: 10/23/1935  [facility] CM/SW Contact  Liliana Cline, LCSW Phone Number: 08/30/2021, 1:35 PM  Clinical Narrative:    CSW spoke with patient's wife who requested an update. Informed her that we are awaiting bed offers and will then start insurance auth. She verbalized understanding.  CSW asked SNFs with pending bed requests to review offers [facility], [facility], [facility], [facility]).    Expected Discharge Plan: Skilled Nursing Facility Barriers to Discharge: Continued Medical Work up  Expected Discharge Plan and Services Expected Discharge Plan: Skilled Nursing Facility   Discharge Planning Services: CM Consult Post Acute Care Choice: Skilled Nursing Facility Living arrangements for the past 2 months: Single Family Home                 DME Arranged: N/A DME Agency: NA                   Social Determinants of Health (SDOH) Interventions    Readmission Risk Interventions     No data to display

## 2021-08-30 NOTE — Progress Notes (Signed)
Speech Language Pathology Treatment: Dysphagia  Patient Details Name: Andrew [name] MRN: [id number] DOB: 01/08/1936 Today's Date: 08/30/2021 Time: 1040-1056 SLP Time Calculation (min) (ACUTE ONLY): 16 min  Assessment / Plan / Recommendation Clinical Impression  Pt's nurse and NT present. Both report intermittent coughing when consuming thin liquids via cup. NT also states that pt continues to consume > 50% of puree. SLP provided skilled observation of pt consuming thin liquids via cup. With hand over hand support, pt able to self-feed. Pt consumed several sips without any overt s/s of aspiration. Given pt's current medical condition, recent extubation and confusion, his s/s of aspiration may fluctuate. Recommend pt remain on thin liquids with strict aspiration precautions and monitoring of all vitals.   Pt declined any trials of puree. At baseline, pt's wife states that pt ate at a very fast rate and "shoveled" food. Given this, pt's wife is agreeable to pt remaining on puree diet with trials of soft solids to be presented during next session.    HPI HPI: 86 yo M presenting to [facility] ED from home via EMS with complaints of dyspnea, hypoxia & fatigue on 08/22/2021.  Chest x-ray revealed 08/22/2021 Bilateral multifocal airspace opacities concerning for multifocal  pneumonia. Chest x-ray on 08/27/2021 revealed Similar enlarged cardiac silhouette and central vascular prominence, with bilateral interstitial and alveolar opacities. Pt intubated on 08/23/2021 thru 08/28/2021.      SLP Plan  Continue with current plan of care      Recommendations for follow up therapy are one component of a multi-disciplinary discharge planning process, led by the attending physician.  Recommendations may be updated based on patient status, additional functional criteria and insurance authorization.    Recommendations  Diet recommendations: Dysphagia 1 (puree);Thin liquid Liquids provided via: Cup;No  straw Medication Administration: Crushed with puree Supervision: Full supervision/cueing for compensatory strategies;Staff to assist with self feeding Compensations: Minimize environmental distractions;Slow rate;Small sips/bites Postural Changes and/or Swallow Maneuvers: Out of bed for meals;Seated upright 90 degrees                Oral Care Recommendations: Oral care BID Follow Up Recommendations: Skilled nursing-short term rehab (<3 hours/day) Assistance recommended at discharge: Frequent or constant Supervision/Assistance SLP Visit Diagnosis: Dysphagia, oropharyngeal phase (R13.12) Plan: Continue with current plan of care         Bradyn Vassey B. Dreama Saa, M.S., CCC-SLP, Tree surgeon Certified Brain Injury Specialist [facility]  [facility] Rehabilitation Services Office [phone] Ascom [phone] Fax [phone]

## 2021-08-30 NOTE — Progress Notes (Signed)
Patients 4am blood glucose level 69. Patient alert and responsive. Given orange juice. Patient blood glucose at 4:30am 95. Patient currently resting without distress. Will continue to monitor.

## 2021-08-30 NOTE — Consult Note (Signed)
ANTICOAGULATION CONSULT NOTE  Pharmacy Consult for Warfarin Indication: atrial fibrillation  Patient Measurements: Height: 5\' 10"  (177.8 cm) Weight: 80.9 kg (178 lb 5.6 oz) IBW/kg (Calculated) : 73  Labs: Recent Labs    08/28/21 0521 08/29/21 0513 08/30/21 0326  HGB 8.2* 9.1*  --   HCT 25.5* 27.0*  --   PLT 189 171  --   LABPROT 32.3* 26.1* 22.0*  INR 3.2* 2.4* 1.9*  CREATININE 1.00 0.93  --      Estimated Creatinine Clearance: 61.1 mL/min (by C-G formula based on SCr of 0.93 mg/dL).   Medical History: Past Medical History:  Diagnosis Date   CHF (congestive heart failure) (HCC)    Diabetes mellitus without complication (HCC)    Essential hypertension    Mixed hyperlipidemia    Permanent atrial fibrillation (HCC)    a. s/p catheter ablation ~ 12 yrs ago @ [facility] per family->unsuccessful; b. CHA2DS2VASc = 5-->chronic coumadin.    Medications:  Warfarin 2.5 mg MoWeFrSa and 1.25 mg on TuThSu (TWD 13.75 mg)  Assessment: Patient is a 86 y/o M with medical history including permanent Afib on warfarin who was admitted 7/21 after presenting to the ED from home with SOB. Patient ultimately required intubation from 7/22 thru 7/27 for acute respiratory failure secondary to CAP and pulmonary edema. INR was supratherapeutic on admission to 5.3.   Date INR Plan  7/21 N/A 2.5 mg (presumed, PTA)  7/22 5.3 Held  7/23 4.9 Held  7/24 4.1 Held  7/25 3.3 Held  7/26 3.3 Held (hemoptysis reported)  7/27 3.2 Held (hemoptysis resolved)  7/28 2.4 2.5 mg  7/29 1.9 2.5 mg    Goal of Therapy:  INR 2-3 Monitor platelets by anticoagulation protocol: Yes   Plan:  --Patient's warfarin was on hold for six days for supratherapeutic INR and bleeding issues. Suspect reason for elevated INR was multifactorial and probably secondary to transient hepatic dysfunction, dietary changes, and drug-drug interactions. Elevated INR at this point is not likely secondary to residual warfarin  effect --Will give warfarin 2.5 mg (home dose) again tonight. Given patient's apparent warfarin sensitivity and elevated INRs, will continue to dose cautiously --Daily INR per protocol --CBC at least every 3 days per protocol  8/29, PharmD Clinical Pharmacist 08/30/2021 9:12 AM

## 2021-08-30 NOTE — Progress Notes (Signed)
[facility]  - [place] [facility]   PATIENT NAME: Andrew [name]    MR#:  [id number]  DATE OF BIRTH:  Apr 03, 1935  SUBJECTIVE:  patient seen earlier in the ICU. Daughter and wife at bedside. Patient has some early dementia/cognitive decline at baseline.  Pt bit confused earlier wanting to get up and go home. Had received one dose of Haldol last night due to confusion.  VITALS:  Blood pressure (!) 141/60, pulse (!) 59, temperature 98.7 F (37.1 C), resp. rate 18, height 5\' 10"  (1.778 m), weight 80.9 kg, SpO2 96 %.  PHYSICAL EXAMINATION:   GENERAL:  86 y.o.-year-old patient lying in the bed with no acute distress. Weak LUNGS: Normal breath sounds bilaterally, no wheezing, rales, rhonchi.  CARDIOVASCULAR: S1, S2 normal. No murmurs, rubs, or gallops.  ABDOMEN: Soft, nontender, nondistended. Bowel sounds present.  EXTREMITIES: No  edema b/l. External catheter    NEUROLOGIC: nonfocal  patient is alert and awake some baseline cognitive decline SKIN: per RN   LABORATORY PANEL:  CBC Recent Labs  Lab 08/29/21 0513  WBC 8.0  HGB 9.1*  HCT 27.0*  PLT 171     Chemistries  Recent Labs  Lab 08/29/21 0513  NA 143  K 3.6  CL 110  CO2 26  GLUCOSE 157*  BUN 65*  CREATININE 0.93  CALCIUM 9.0  MG 2.4     Assessment and Plan 86 yo M presenting to [facility] ED from home via EMS with complaints of dyspnea, hypoxia & fatigue. Patient has history of early cognitive decline/dementia, hypertension, diabetes to the emergency fortress of breath and found to have sats in the 50s. He was placed on CPAP with sats 85 to 89%. Eventually patient got intubated and placed on the ventilator. He is now extubated and sats have improved.   Acute Hypoxic/ Hypercapnic Respiratory Failure secondary to CAP & aspiration pneumonitis, and suspected pulmonary edema  Small volume Hemoptysis, likely in setting of supra-therapeutic INR -- patient now extubated. -- Oxygen saturations 97 to 98% on  room air -- change to PO steroid -- status post bronchoscopy 7/26-- showed changes of pneumonia with mucus plugging right lower lobe.  Severe sepsis due to multi-lobar pneumonia and SBO -- patient completed course of Rocephin and doxycycline -- afebrile -- sepsis resolved  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 Elevated Troponin secondary to demand ischemia Chronic Atrial Fibrillation Moderate to severe Aortic Stenosis PMHx: HFpEF, permanent pacemaker, HTN, HLD -- diuresis as BP and renal function permits. Patient currently on room air. Hold off Lasix. -- Will resume lisinopril and amlodipine  Small bowel obstruction-- improved -- NG tube removed. -- Seen by speech therapy patient tolerating dysphagia diet  Acute Kidney Injury in the setting of suspected sepsis ~ IMPROVED Acute Hyponatremia secondary to unknown etiology in the setting of slight pseudohyponatremia due to hyperglycemia ~ RESOLVED -Monitor I&O's / urinary output -Ensure adequate renal perfusion -Avoid nephrotoxic agents as able -Replace electrolytes as indicated   Type 2 Diabetes Mellitus Steroid Induced Hyperglycemia Hemoglobin A1C: 7.2 - SSI --will resume glipizide and Actos.-- Hold metformin  Acute Metabolic Encephalopathy ~ IMPROVING Dementia - early stages CT Head on admission negative for acute intracranial abnormality -- PT OT to work with patient -- resume Namenda and Aricept TOC for discharge planning-- PT recommends rehab -- appreciate speech therapy input -added seroquel qhs  Nutrition Status: Nutrition Problem: Moderate Malnutrition Etiology: chronic illness (CHF, advanced age) Signs/Symptoms: moderate fat depletion, moderate muscle depletion, severe muscle depletion  follow dietary recommendation   Procedures: Family communication : daughter and wife in the room Consults : ICU CODE STATUS: DNR DVT Prophylaxis : warfarin Level of care: Med-Surg Status is: Inpatient Remains  inpatient appropriate because: patient transferred out of ICU critically ill for seven days. PT OT to work with patient [facility] for discharge planning. Awaiting rehb bed offers    TOTAL TIME TAKING CARE OF THIS PATIENT: 35 minutes.  >50% time spent on counselling and coordination of care  Note: This dictation was prepared with Dragon dictation along with smaller phrase technology. Any transcriptional errors that result from this process are unintentional.  Enedina Finner M.D    [facility]   CC: Primary care physician; Gracelyn Nurse, MD

## 2021-08-31 DIAGNOSIS — E440 Moderate protein-calorie malnutrition: Secondary | ICD-10-CM | POA: Diagnosis not present

## 2021-08-31 DIAGNOSIS — A419 Sepsis, unspecified organism: Secondary | ICD-10-CM | POA: Diagnosis not present

## 2021-08-31 DIAGNOSIS — J189 Pneumonia, unspecified organism: Secondary | ICD-10-CM | POA: Diagnosis not present

## 2021-08-31 DIAGNOSIS — J9601 Acute respiratory failure with hypoxia: Secondary | ICD-10-CM | POA: Diagnosis not present

## 2021-08-31 LAB — PROTIME-INR
INR: 2.6 — ABNORMAL HIGH (ref 0.8–1.2)
Prothrombin Time: 27.2 seconds — ABNORMAL HIGH (ref 11.4–15.2)

## 2021-08-31 LAB — GLUCOSE, CAPILLARY
Glucose-Capillary: 120 mg/dL — ABNORMAL HIGH (ref 70–99)
Glucose-Capillary: 176 mg/dL — ABNORMAL HIGH (ref 70–99)
Glucose-Capillary: 236 mg/dL — ABNORMAL HIGH (ref 70–99)
Glucose-Capillary: 284 mg/dL — ABNORMAL HIGH (ref 70–99)
Glucose-Capillary: 130 mg/dL — ABNORMAL HIGH (ref 70–99)

## 2021-08-31 MED ORDER — FLUCONAZOLE 100 MG PO TABS
100.0000 mg | ORAL_TABLET | Freq: Every day | ORAL | Status: DC
  Administered 2021-08-31 – 2021-09-01 (×2): 100 mg via ORAL
  Filled 2021-08-31 (×2): qty 1

## 2021-08-31 MED ORDER — INSULIN ASPART 100 UNIT/ML IJ SOLN: 0.0000 [IU] | Freq: Every day | INTRAMUSCULAR | Status: DC

## 2021-08-31 MED ORDER — WARFARIN SODIUM 2.5 MG PO TABS
1.2500 mg | ORAL_TABLET | Freq: Once | ORAL | Status: AC
  Administered 2021-08-31: 1.25 mg via ORAL
  Filled 2021-08-31: qty 1
  Filled 2021-08-31: qty 0.5

## 2021-08-31 MED ORDER — INSULIN ASPART 100 UNIT/ML IJ SOLN
0.0000 [IU] | Freq: Three times a day (TID) | INTRAMUSCULAR | Status: DC
  Administered 2021-08-31: 3 [IU] via SUBCUTANEOUS
  Administered 2021-08-31: 5 [IU] via SUBCUTANEOUS
  Administered 2021-09-01: 1 [IU] via SUBCUTANEOUS
  Administered 2021-09-01: 5 [IU] via SUBCUTANEOUS
  Filled 2021-08-31 (×4): qty 1

## 2021-08-31 NOTE — Progress Notes (Signed)
Physical Therapy Treatment Patient Details Name: Andrew [name] MRN: [id number] DOB: 01/14/1936 Today's Date: 08/31/2021   History of Present Illness 86 yo M presenting to [facility] ED from home via EMS with complaints of dyspnea, hypoxia & fatigue.  History provided by the patient, and the patient's wife and daughter who are bedside. The patient was in his normal state of health until Thursday 08/21/21 when the patient developed lower abdominal pain and the "shakes". He was diagnosed with a UTI and started on Keflex. Then on 08/22/21 he took a nap and was awoken by his wife around 4:30 pm to take some medicine. At this time he woke up and was dyspneic, unable to complete a sentence, as well as severely fatigued unable to stand.    PT Comments    Patient is in bed upon PT arrival with family present. Nursing came into room due to patient having bowel movement in bed. Patient is a x2 person assist with all mobility so nursing and PT combined for rolling patient for cleaning. Transitioned EOB with max A for bed linen change. Sit to stand x2 trials with max A and RW for 30 seconds each for wound bandage change and final linen change. Patient repositioned in now clean bed and PT performed supine strengthening interventions. Pamphlet left in room for patient to perform between sessions. Current POC remains appropriate at this time.     Recommendations for follow up therapy are one component of a multi-disciplinary discharge planning process, led by the attending physician.  Recommendations may be updated based on patient status, additional functional criteria and insurance authorization.  Follow Up Recommendations  Skilled nursing-short term rehab (<3 hours/day) Can patient physically be transported by private vehicle: No   Assistance Recommended at Discharge Frequent or constant Supervision/Assistance  Patient can return home with the following Two people to help with walking and/or transfers;Two people to  help with bathing/dressing/bathroom;Assistance with cooking/housework;Assist for transportation   Equipment Recommendations       Recommendations for Other Services       Precautions / Restrictions Precautions Precautions: Fall;ICD/Pacemaker Restrictions Weight Bearing Restrictions: No     Mobility  Bed Mobility Overal bed mobility: Needs Assistance Bed Mobility: Rolling, Supine to Sit, Sit to Supine Rolling: Max assist   Supine to sit: HOB elevated, Max assist, +2 for physical assistance Sit to supine: Max assist, +2 for physical assistance   General bed mobility comments: patient requries max a for bed mobility and transitions. Has difficulty following commands. Responds well to tactile cueing    Transfers Overall transfer level: Needs assistance Equipment used: Rolling walker (2 wheels) Transfers: Sit to/from Stand Sit to Stand: +2 physical assistance, Max assist           General transfer comment: max sequencing and cueing; x2 trials    Ambulation/Gait               General Gait Details: deferred due to difficulty standing   Stairs             Wheelchair Mobility    Modified Rankin (Stroke Patients Only)       Balance Overall balance assessment: Needs assistance Sitting-balance support: Bilateral upper extremity supported, Feet supported Sitting balance-Leahy Scale: Poor Sitting balance - Comments: able to sit with occasional release from PT ~3 seconds prior to needing assitance to remian in upright position. Postural control: Posterior lean Standing balance support: Bilateral upper extremity supported, Reliant on assistive device for balance Standing balance-Leahy Scale: Poor Standing  balance comment: two person maxA to stand at [facility].                            Cognition Arousal/Alertness: Awake/alert Behavior During Therapy: WFL for tasks assessed/performed Overall Cognitive Status: History of cognitive impairments - at  baseline                                 General Comments: Hx of dementia. Follows single step commands inconsistently. Requires multimodal cueing throughout session.        Exercises General Exercises - Lower Extremity Ankle Circles/Pumps: AROM, Strengthening, Both, 10 reps, Supine Heel Slides: Strengthening, Both, 10 reps, Supine Straight Leg Raises: AAROM, 5 reps, Both, Supine Other Exercises Other Exercises: Patient assisted with bed mobility including rolling and transferring to EOB, clean up s/p bowel movement, linen change while performing sit to stand x 2    General Comments General comments (skin integrity, edema, etc.): mobility with cleaning of patient and bed linen change      Pertinent Vitals/Pain Pain Assessment Pain Assessment: Faces Faces Pain Scale: Hurts a little bit Pain Location: Unable to specify location Pain Descriptors / Indicators: Aching Pain Intervention(s): Monitored during session, Repositioned    Home Living                          Prior Function            PT Goals (current goals can now be found in the care plan section) Acute Rehab PT Goals PT Goal Formulation: Patient unable to participate in goal setting Progress towards PT goals: Progressing toward goals (patient's family wants him out of bed)    Frequency    Min 2X/week      PT Plan Current plan remains appropriate    Co-evaluation              AM-PAC PT "6 Clicks" Mobility   Outcome Measure  Help needed turning from your back to your side while in a flat bed without using bedrails?: A Lot Help needed moving from lying on your back to sitting on the side of a flat bed without using bedrails?: A Lot Help needed moving to and from a bed to a chair (including a wheelchair)?: Total Help needed standing up from a chair using your arms (e.g., wheelchair or bedside chair)?: A Lot Help needed to walk in hospital room?: Total Help needed climbing 3-5  steps with a railing? : Total 6 Click Score: 9    End of Session Equipment Utilized During Treatment: Gait belt Activity Tolerance: Patient limited by fatigue Patient left: in bed;with call bell/phone within reach;with bed alarm set;with nursing/sitter in room Nurse Communication: Mobility status PT Visit Diagnosis: Other abnormalities of gait and mobility (R26.89);Muscle weakness (generalized) (M62.81);Difficulty in walking, not elsewhere classified (R26.2)     Time: 2542-7062 PT Time Calculation (min) (ACUTE ONLY): 28 min  Charges:  $Therapeutic Exercise: 8-22 mins $Therapeutic Activity: 8-22 mins                     Precious Bard, PT, DPT  08/31/2021, 2:35 PM

## 2021-08-31 NOTE — Progress Notes (Signed)
Speech Language Pathology Treatment:    Patient Details Name: Andrew [name] MRN: [id number] DOB: May 29, 1935 Today's Date: 08/31/2021 Time: 0930-0950 SLP Time Calculation (min) (ACUTE ONLY): 20 min  Assessment / Plan / Recommendation Clinical Impression  Pt seen for diet tolerance/clinical swallowing re-evaluation. Pt alert, pleasantly confused. Cleared with RN.   Per chart review, temp WNL. No recent CXR or WBC. On room air.  Pt given trials of solid, pureed, and thin liquids (via cup sip). Pt needed assistance with feeding. Pt presents with s/sx mild-moderate oral dysphagia c/b prolonged/inefficient mastication and oral residual with solids. Oral swallow was functional with pureed and thin liquids via cup sip. No overt or subtle s/sx pharyngeal dysphagia noted across trials. Suspect oral deficits due to deconditioned state and exacerbated by AMS/confusion.   Recommend continuation of a Pureed Diet with Thin Liquids and safe swallowing strategies/aspiration precautions as outlined below.   SLP to f/u per POC for continued dysphagia management and trials of upgraded textures, as appropriate.    HPI HPI: 86 yo M presenting to [facility] ED from home via EMS with complaints of dyspnea, hypoxia & fatigue on 08/22/2021.  Chest x-ray revealed 08/22/2021 Bilateral multifocal airspace opacities concerning for multifocal  pneumonia. Chest x-ray on 08/27/2021 revealed Similar enlarged cardiac silhouette and central vascular prominence, with bilateral interstitial and alveolar opacities. Pt intubated on 08/23/2021 thru 08/28/2021.      SLP Plan  Continue with current plan of care      Recommendations for follow up therapy are one component of a multi-disciplinary discharge planning process, led by the attending physician.  Recommendations may be updated based on patient status, additional functional criteria and insurance authorization.    Recommendations  Diet recommendations: Dysphagia 1  (puree);Thin liquid Liquids provided via: Cup;No straw Medication Administration: Crushed with puree Supervision: Full supervision/cueing for compensatory strategies;Staff to assist with self feeding Compensations: Minimize environmental distractions;Slow rate;Small sips/bites Postural Changes and/or Swallow Maneuvers: Out of bed for meals;Seated upright 90 degrees                Oral Care Recommendations: Oral care BID Follow Up Recommendations: Skilled nursing-short term rehab (<3 hours/day) Assistance recommended at discharge: Frequent or constant Supervision/Assistance SLP Visit Diagnosis: Dysphagia, oropharyngeal phase (R13.12) Plan: Continue with current plan of care          Clyde Canterbury, M.S., CCC-SLP Speech-Language Pathologist [facility] [phone] Arnette Felts)  Woodroe Chen  08/31/2021, 12:23 PM

## 2021-08-31 NOTE — Consult Note (Addendum)
ANTICOAGULATION CONSULT NOTE  Pharmacy Consult for Warfarin Indication: atrial fibrillation  Patient Measurements: Height: 5\' 10"  (177.8 cm) Weight: 80.9 kg (178 lb 5.6 oz) IBW/kg (Calculated) : 73  Labs: Recent Labs    08/29/21 0513 08/30/21 0326 08/31/21 0321  HGB 9.1*  --   --   HCT 27.0*  --   --   PLT 171  --   --   LABPROT 26.1* 22.0* 27.2*  INR 2.4* 1.9* 2.6*  CREATININE 0.93  --   --      Estimated Creatinine Clearance: 61.1 mL/min (by C-G formula based on SCr of 0.93 mg/dL).   Medical History: Past Medical History:  Diagnosis Date   CHF (congestive heart failure) (HCC)    Diabetes mellitus without complication (HCC)    Essential hypertension    Mixed hyperlipidemia    Permanent atrial fibrillation (HCC)    a. s/p catheter ablation ~ 12 yrs ago @ [facility] per family->unsuccessful; b. CHA2DS2VASc = 5-->chronic coumadin.    Medications:  Warfarin 2.5 mg MoWeFrSa and 1.25 mg on TuThSu (TWD 13.75 mg)  Assessment: Patient is a 86 y/o M with medical history including permanent Afib on warfarin who was admitted 7/21 after presenting to the ED from home with SOB. Patient ultimately required intubation from 7/22 thru 7/27 for acute respiratory failure secondary to CAP and pulmonary edema. INR was supratherapeutic on admission to 5.3.   Date INR Plan  7/21 N/A 2.5 mg (presumed, PTA)  7/22 5.3 Held  7/23 4.9 Held  7/24 4.1 Held  7/25 3.3 Held  7/26 3.3 Held (hemoptysis reported)  7/27 3.2 Held (hemoptysis resolved)  7/28 2.4 2.5 mg  7/29 1.9 2.5 mg  7/30 2.6 1.25 mg    Goal of Therapy:  INR 2-3 Monitor platelets by anticoagulation protocol: Yes   Plan:  --Will give warfarin 1.25 mg (home dose) today. Given patient's apparent warfarin sensitivity and elevated INRs this admission, will continue to dose cautiously --Daily INR per protocol --CBC at least every 3 days per protocol  8/30, PharmD Clinical Pharmacist 08/31/2021 10:13 AM

## 2021-08-31 NOTE — TOC Progression Note (Addendum)
Transition of Care [facility]) - Progression Note    Patient Details  Name: CLYDELL SPOSITO MRN: [id number] Date of Birth: [birth year]  Transition of Care [facility]) CM/SW Contact  Liliana Cline, LCSW Phone Number: 08/31/2021, 11:00 AM  Clinical Narrative:    Presented bed offers [employer], [employer]) to spouse. She chose [employer] based on [employer]gov ratings. Notified Verlon Au at [employer]. Plan for [employer] pending auth. Asked PT if able to see patient today. Will start auth when updated PT note is in.   2:44- Auth started in [facility]. Verlon Au with [facility] confirmed they can take patient tomorrow pending auth approval.  Expected Discharge Plan: Skilled Nursing Facility Barriers to Discharge: Continued Medical Work up  Expected Discharge Plan and Services Expected Discharge Plan: Skilled Nursing Facility   Discharge Planning Services: CM Consult Post Acute Care Choice: Skilled Nursing Facility Living arrangements for the past 2 months: Single Family Home                 DME Arranged: N/A DME Agency: NA                   Social Determinants of Health (SDOH) Interventions    Readmission Risk Interventions     No data to display

## 2021-08-31 NOTE — Progress Notes (Signed)
[facility]  - [place] at [facility]   PATIENT NAME: Andrew [name]    MR#:  [id number]  DATE OF BIRTH:  08/09/1935  SUBJECTIVE:   Patient has some early dementia/cognitive decline at baseline.  Much calmer today  VITALS:  Blood pressure (!) 141/52, pulse 64, temperature 97.8 F (36.6 C), temperature source Oral, resp. rate 17, height 5\' 10"  (1.778 m), weight 80.9 kg, SpO2 92 %.  PHYSICAL EXAMINATION:   GENERAL:  86 y.o.-year-old patient lying in the bed with no acute distress. Weak LUNGS: Normal breath sounds bilaterally, no wheezing, rales, rhonchi.  CARDIOVASCULAR: S1, S2 normal. No murmurs, rubs, or gallops.  ABDOMEN: Soft, nontender, nondistended. Bowel sounds present.  EXTREMITIES: No  edema b/l. External catheter    NEUROLOGIC: nonfocal  patient is alert and awake some baseline cognitive decline SKIN: per RN   LABORATORY PANEL:  CBC Recent Labs  Lab 08/29/21 0513  WBC 8.0  HGB 9.1*  HCT 27.0*  PLT 171     Chemistries  Recent Labs  Lab 08/29/21 0513  NA 143  K 3.6  CL 110  CO2 26  GLUCOSE 157*  BUN 65*  CREATININE 0.93  CALCIUM 9.0  MG 2.4     Assessment and Plan 86 yo M presenting to [facility] ED from home via EMS with complaints of dyspnea, hypoxia & fatigue. Patient has history of early cognitive decline/dementia, hypertension, diabetes to the emergency fortress of breath and found to have sats in the 50s. He was placed on CPAP with sats 85 to 89%. Eventually patient got intubated and placed on the ventilator. He is now extubated and sats have improved.   Acute Hypoxic/ Hypercapnic Respiratory Failure secondary to CAP & aspiration pneumonitis, and suspected pulmonary edema  Small volume Hemoptysis, likely in setting of supra-therapeutic INR -- patient now extubated. -- Oxygen saturations 97 to 98% on room air -- change to PO steroid -- status post bronchoscopy 7/26-- showed changes of pneumonia with mucus plugging right lower  lobe. -Sputum culture--candida + treat with Diflucan for 7 days  Severe sepsis due to multi-lobar pneumonia and SBO -- patient completed course of Rocephin and doxycycline -- afebrile -- sepsis resolved  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 Elevated Troponin secondary to demand ischemia Chronic Atrial Fibrillation Moderate to severe Aortic Stenosis PMHx: HFpEF, permanent pacemaker, HTN, HLD -- diuresis as BP and renal function permits. Patient currently on room air. Hold off Lasix. -- Will resume lisinopril and amlodipine  Small bowel obstruction-- improved -- NG tube removed. -- Seen by speech therapy patient tolerating  Dysphagia 1 (puree);Thin liquid Liquids provided via: Cup;No straw Medication Administration: Crushed with puree  Acute Kidney Injury in the setting of suspected sepsis ~ IMPROVED Acute Hyponatremia secondary to unknown etiology in the setting of slight pseudohyponatremia due to hyperglycemia ~ RESOLVED -Monitor I&O's / urinary output -Ensure adequate renal perfusion -Avoid nephrotoxic agents as able -Replace electrolytes as indicated   Type 2 Diabetes Mellitus Steroid Induced Hyperglycemia Hemoglobin A1C: 7.2 - SSI --will resume glipizide and Actos.-- Hold metformin  Acute Metabolic Encephalopathy ~ IMPROVING Dementia - early stages CT Head on admission negative for acute intracranial abnormality -- PT OT to work with patient -- resume Namenda and Aricept TOC for discharge planning-- PT recommends rehab -- appreciate speech therapy input -added seroquel qhs  H/o Afib, chronic --cont po warfarin --HR stable  Nutrition Status: Nutrition Problem: Moderate Malnutrition Etiology: chronic illness (CHF, advanced age) Signs/Symptoms: moderate fat depletion, moderate muscle  depletion, severe muscle depletion follow dietary recommendation   Procedures: Family communication : daughter and wife in the room Consults : ICU CODE STATUS:  DNR DVT Prophylaxis : warfarin Level of care: Med-Surg Status is: Inpatient Remains inpatient appropriate because: patient transferred out of ICU critically ill for seven days. PT OT to work with patient [facility] for discharge planning. Awaiting rehb bed offers    TOTAL TIME TAKING CARE OF THIS PATIENT: 35 minutes.  >50% time spent on counselling and coordination of care  Note: This dictation was prepared with Dragon dictation along with smaller phrase technology. Any transcriptional errors that result from this process are unintentional.  Enedina Finner M.D    [facility]   CC: Primary care physician; Gracelyn Nurse, MD

## 2021-09-01 DIAGNOSIS — J9600 Acute respiratory failure, unspecified whether with hypoxia or hypercapnia: Secondary | ICD-10-CM | POA: Diagnosis not present

## 2021-09-01 DIAGNOSIS — J9612 Chronic respiratory failure with hypercapnia: Secondary | ICD-10-CM | POA: Diagnosis not present

## 2021-09-01 DIAGNOSIS — I959 Hypotension, unspecified: Secondary | ICD-10-CM | POA: Diagnosis not present

## 2021-09-01 DIAGNOSIS — A419 Sepsis, unspecified organism: Secondary | ICD-10-CM | POA: Diagnosis not present

## 2021-09-01 DIAGNOSIS — J9611 Chronic respiratory failure with hypoxia: Secondary | ICD-10-CM | POA: Diagnosis not present

## 2021-09-01 DIAGNOSIS — Z7984 Long term (current) use of oral hypoglycemic drugs: Secondary | ICD-10-CM | POA: Diagnosis not present

## 2021-09-01 DIAGNOSIS — G301 Alzheimer's disease with late onset: Secondary | ICD-10-CM | POA: Diagnosis not present

## 2021-09-01 DIAGNOSIS — Z7401 Bed confinement status: Secondary | ICD-10-CM | POA: Diagnosis not present

## 2021-09-01 DIAGNOSIS — R6520 Severe sepsis without septic shock: Secondary | ICD-10-CM | POA: Diagnosis not present

## 2021-09-01 DIAGNOSIS — E119 Type 2 diabetes mellitus without complications: Secondary | ICD-10-CM | POA: Diagnosis not present

## 2021-09-01 DIAGNOSIS — Z9981 Dependence on supplemental oxygen: Secondary | ICD-10-CM | POA: Diagnosis not present

## 2021-09-01 DIAGNOSIS — J189 Pneumonia, unspecified organism: Secondary | ICD-10-CM | POA: Diagnosis not present

## 2021-09-01 DIAGNOSIS — E440 Moderate protein-calorie malnutrition: Secondary | ICD-10-CM | POA: Diagnosis not present

## 2021-09-01 DIAGNOSIS — J9602 Acute respiratory failure with hypercapnia: Secondary | ICD-10-CM | POA: Diagnosis not present

## 2021-09-01 DIAGNOSIS — G9341 Metabolic encephalopathy: Secondary | ICD-10-CM | POA: Diagnosis not present

## 2021-09-01 DIAGNOSIS — J181 Lobar pneumonia, unspecified organism: Secondary | ICD-10-CM | POA: Diagnosis not present

## 2021-09-01 DIAGNOSIS — J9601 Acute respiratory failure with hypoxia: Secondary | ICD-10-CM | POA: Diagnosis not present

## 2021-09-01 LAB — GLUCOSE, CAPILLARY
Glucose-Capillary: 147 mg/dL — ABNORMAL HIGH (ref 70–99)
Glucose-Capillary: 288 mg/dL — ABNORMAL HIGH (ref 70–99)

## 2021-09-01 LAB — PROTIME-INR
INR: 3.1 — ABNORMAL HIGH (ref 0.8–1.2)
Prothrombin Time: 32 seconds — ABNORMAL HIGH (ref 11.4–15.2)

## 2021-09-01 MED ORDER — ADULT MULTIVITAMIN W/MINERALS CH: 1.0000 | ORAL_TABLET | Freq: Every day | ORAL | 0 refills | Status: DC

## 2021-09-01 MED ORDER — WARFARIN SODIUM 2.5 MG PO TABS
1.2500 mg | ORAL_TABLET | ORAL | 0 refills | Status: DC
Start: 2021-09-01 — End: 2021-09-03

## 2021-09-01 MED ORDER — QUETIAPINE FUMARATE 25 MG PO TABS: 25.0000 mg | ORAL_TABLET | Freq: Every day | ORAL | 0 refills | Status: DC

## 2021-09-01 MED ORDER — FUROSEMIDE 40 MG PO TABS: 40.0000 mg | ORAL_TABLET | Freq: Every day | ORAL | 0 refills | Status: DC

## 2021-09-01 MED ORDER — POLYETHYLENE GLYCOL 3350 17 G PO PACK: 17.0000 g | PACK | Freq: Every day | ORAL | 0 refills | Status: DC

## 2021-09-01 MED ORDER — FUROSEMIDE 80 MG PO TABS: 40.0000 mg | ORAL_TABLET | Freq: Every day | ORAL | 0 refills | Status: DC | PRN

## 2021-09-01 MED ORDER — FLUCONAZOLE 100 MG PO TABS: 100.0000 mg | ORAL_TABLET | Freq: Every day | ORAL | 0 refills | Status: DC

## 2021-09-01 MED ORDER — ENSURE MAX PROTEIN PO LIQD: 11.0000 [oz_av] | Freq: Three times a day (TID) | ORAL | 0 refills | Status: DC

## 2021-09-01 NOTE — Progress Notes (Signed)
Physical Therapy Treatment Patient Details Name: Andrew [name] MRN: [id number] DOB: 12-13-1935 Today's Date: 09/01/2021   History of Present Illness 86 yo M presenting to [facility] ED from home via EMS with complaints of dyspnea, hypoxia & fatigue.  History provided by the patient, and the patient's wife and daughter who are bedside. The patient was in his normal state of health until Thursday 08/21/21 when the patient developed lower abdominal pain and the "shakes". He was diagnosed with a UTI and started on Keflex. Then on 08/22/21 he took a nap and was awoken by his wife around 4:30 pm to take some medicine. At this time he woke up and was dyspneic, unable to complete a sentence, as well as severely fatigued unable to stand.    PT Comments    Patient continues to require assistance with all functional mobility. Max A for bed mobility. The patient has a right lateral lean while seated that required facilitation and verbal cues for correction to midline. He was unable to stand with one person assistance but performed incremental lateral seated transfer with maximal assistance. He was fatigued with minimal activity. Recommend to continue PT to maximize independence and decrease caregiver burden. SNF recommended once discharged.    Recommendations for follow up therapy are one component of a multi-disciplinary discharge planning process, led by the attending physician.  Recommendations may be updated based on patient status, additional functional criteria and insurance authorization.  Follow Up Recommendations  Skilled nursing-short term rehab (<3 hours/day) Can patient physically be transported by private vehicle: No   Assistance Recommended at Discharge Frequent or constant Supervision/Assistance  Patient can return home with the following Two people to help with walking and/or transfers;Two people to help with bathing/dressing/bathroom;Assistance with cooking/housework;Assist for transportation    Equipment Recommendations   (to bed determined at next level of care)    Recommendations for Other Services       Precautions / Restrictions Precautions Precautions: Fall;ICD/Pacemaker Restrictions Weight Bearing Restrictions: No     Mobility  Bed Mobility Overal bed mobility: Needs Assistance Bed Mobility: Supine to Sit, Sit to Supine     Supine to sit: Max assist Sit to supine: Max assist   General bed mobility comments: assistance for trunk and BLE support. verbal cues for technique and sequencing. increased time and effort required    Transfers Overall transfer level: Needs assistance                 General transfer comment: patient was unable to stand with one person assistance despite cues for technique. limited initiation of standing by the patient. patient was able to perform incremental lateral scoot transfer towards right side x 3 bouts with Max A. patient fatigued with minimal activity    Ambulation/Gait         Gait velocity: not assessed due to difficulty with standing         Stairs             Wheelchair Mobility    Modified Rankin (Stroke Patients Only)       Balance Overall balance assessment: Needs assistance Sitting-balance support: Feet supported Sitting balance-Leahy Scale: Poor Sitting balance - Comments: faciliation for midline posture. patient is able to correct from right lateral lean to midline intermittently with the faciliation and cues. total sitting time ~ 6 minutes Postural control: Right lateral lean  Cognition Arousal/Alertness: Lethargic Behavior During Therapy: Flat affect Overall Cognitive Status: No family/caregiver present to determine baseline cognitive functioning                                 General Comments: history of dementia. patient is able to follow single step commands most of the time with multi-modal cues and extra time  provided        Exercises      General Comments        Pertinent Vitals/Pain Pain Assessment Pain Assessment: No/denies pain    Home Living                          Prior Function            PT Goals (current goals can now be found in the care plan section) Acute Rehab PT Goals Patient Stated Goal: none stated PT Goal Formulation: Patient unable to participate in goal setting Progress towards PT goals: Progressing toward goals    Frequency    Min 2X/week      PT Plan Current plan remains appropriate    Co-evaluation              AM-PAC PT "6 Clicks" Mobility   Outcome Measure  Help needed turning from your back to your side while in a flat bed without using bedrails?: A Lot Help needed moving from lying on your back to sitting on the side of a flat bed without using bedrails?: A Lot Help needed moving to and from a bed to a chair (including a wheelchair)?: Total Help needed standing up from a chair using your arms (e.g., wheelchair or bedside chair)?: Total Help needed to walk in hospital room?: Total Help needed climbing 3-5 steps with a railing? : Total 6 Click Score: 8    End of Session Equipment Utilized During Treatment: Gait belt Activity Tolerance: Patient limited by fatigue Patient left: in bed;with call bell/phone within reach;with bed alarm set;with family/visitor present (nurse tech present to assist with breakfast) Nurse Communication: Mobility status PT Visit Diagnosis: Other abnormalities of gait and mobility (R26.89);Muscle weakness (generalized) (M62.81);Difficulty in walking, not elsewhere classified (R26.2)     Time: 6599-3570 PT Time Calculation (min) (ACUTE ONLY): 17 min  Charges:  $Therapeutic Activity: 8-22 mins                     Donna Bernard, PT, MPT    Ina Homes 09/01/2021, 11:22 AM

## 2021-09-01 NOTE — Discharge Summary (Signed)
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 Patient has history of early cognitive decline/dementia, hypertension, diabetes to the [facility] and found to have sats in the 32s. He was placed on CPAP with sats 85 to 89%. Eventually patient got intubated and placed on the ventilator. He is now extubated and sats have improved.     Acute Hypoxic/ Hypercapnic Respiratory Failure secondary to CAP & aspiration pneumonitis, and suspected pulmonary edema  Small volume Hemoptysis, likely in setting of supra-therapeutic INR -- patient now extubated. -- Oxygen saturations 97 to 98% on room air -- status post bronchoscopy 7/26-- showed changes of pneumonia with mucus plugging right lower lobe. 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 Patient currently on room air.  -- Will resume lisinopril and amlodipine --lasix 40 mg  wit KCL at discharge. Creat stable   Small bowel  obstruction-- improved -- NG tube removed. -- Seen by speech therapy patient tolerating  Dysphagia 1 (puree);Thin liquid Liquids provided via: Cup;No straw Medication Administration: Crushed with puree --pt was seen by Dr pabon   Acute Kidney Injury in the setting of suspected sepsis ~ IMPROVED Acute Hyponatremia secondary to unknown etiology in the setting of slight pseudohyponatremia due to hyperglycemia ~ RESOLVED -Monitor I&O's / urinary output -Ensure adequate renal perfusion -Avoid nephrotoxic agents as able -Replace electrolytes as indicated --creat 0.9   Type 2 Diabetes Mellitus Steroid Induced Hyperglycemia Hemoglobin A1C: 7.2 - SSI --will resume glipizide and metformin --holding actos at d/c--can be resumed at later date if sugars rise   Acute Metabolic Encephalopathy ~ IMPROVING Dementia - early stages CT Head on admission negative for acute intracranial abnormality -- PT OT to work with patient--recommends rehab -- resume Namenda and Aricept TOC for discharge planning--pt will d/c to [facility] -- appreciate speech therapy input -added seroquel qhs--much calm and sleeping better   H/o Afib, chronic --cont po warfarin --HR stable   Nutrition Status: Nutrition Problem: Moderate Malnutrition Etiology: chronic illness (CHF, advanced age) Signs/Symptoms: moderate fat depletion, moderate muscle depletion, severe muscle depletion follow dietary recommendation    d/c to rehab. Wife informed  Family communication : wife on the phone Consults : ICU CODE STATUS: DNR DVT Prophylaxis : warfarin       Disposition: Rehabilitation facility Diet recommendation:  Discharge Diet Orders (From admission, onward)     Start     Ordered   09/01/21 0000  Diet - low sodium heart healthy        09/01/21 1018  Cardiac and Carb modified diet DISCHARGE MEDICATION: Allergies as of 09/01/2021       Reactions   Methyldopa Nausea Only, Palpitations    Other reaction(s): Unknown Other reaction(s): NAUSEA 'palpitation" Other reaction(s): NAUSEA Other reaction(s): NAUSEA        Medication List     STOP taking these medications    cephALEXin 500 MG capsule Commonly known as: KEFLEX   pioglitazone 15 MG tablet Commonly known as: ACTOS   Potassium 99 MG Tabs       TAKE these medications    amLODipine 2.5 MG tablet Commonly known as: NORVASC Take 2.5 mg by mouth daily.   cyanocobalamin 1000 MCG tablet Commonly known as: VITAMIN B12 Take 1,000 mcg by mouth daily.   donepezil 10 MG tablet Commonly known as: ARICEPT Take 10 mg by mouth at bedtime.   doxazosin 4 MG tablet Commonly known as: CARDURA Take 4 mg by mouth Nightly.   Ensure Max Protein Liqd Take 330 mLs (11 oz total) by mouth 3 (three) times daily.   fluconazole 100 MG tablet Commonly known as: DIFLUCAN Take 1 tablet (100 mg total) by mouth daily for 6 doses.   furosemide 80 MG tablet Commonly known as: LASIX Take 0.5 tablets (40 mg total) by mouth daily as needed. For leg edema and sob What changed:  how much to take when to take this reasons to take this additional instructions   glimepiride 2 MG tablet Commonly known as: AMARYL Take 4 mg by mouth daily.   guaiFENesin-dextromethorphan 100-10 MG/5ML syrup Commonly known as: ROBITUSSIN DM Take 5 mLs by mouth every 4 (four) hours as needed for cough.   lisinopril 20 MG tablet Commonly known as: ZESTRIL Take 20 mg by mouth daily.   memantine 10 MG tablet Commonly known as: NAMENDA Take 10 mg by mouth daily.   metFORMIN 1000 MG tablet Commonly known as: GLUCOPHAGE Take 1,000 mg by mouth 2 (two) times daily.   multivitamin with minerals Tabs tablet Take 1 tablet by mouth daily.   polyethylene glycol 17 g packet Commonly known as: MIRALAX / GLYCOLAX Take 17 g by mouth daily. Start taking on: September 02, 2021   QUEtiapine 25 MG tablet Commonly known as: SEROQUEL Take 1 tablet (25 mg  total) by mouth at bedtime.   simvastatin 20 MG tablet Commonly known as: ZOCOR Take 20 mg by mouth Nightly.   warfarin 2.5 MG tablet Commonly known as: COUMADIN Take 0.5-1 tablets (1.25-2.5 mg total) by mouth as directed. Take 1/2 tablet (1.25 mg) daily until 8/5. ON 8/6 take 1/2 tablet (1.25 mg) on Tues,Thur,_0 /31/23 1018            Follow-up Information     Baxter Hire, MD. Schedule an appointment as soon as possible for a visit in 1 week(s).   Specialty: Internal  Medicine Contact information: [place] [address] [phone]         Nelva Bush, MD .   Specialty: Cardiology Contact information: [place] [address] [phone]                Discharge Exam: Danley Danker Weights   08/29/21 0418 08/30/21 0408 09/01/21 0427  Weight: 80.9 kg 80.9 kg 80.9 kg     Condition at discharge: fair  The results of significant diagnostics from this hospitalization (including imaging, microbiology, ancillary and laboratory) are listed below for reference.   Imaging Studies: DG Abd 1 View  Result Date: 08/27/2021 CLINICAL DATA:  Ileus EXAM: ABDOMEN - 1 VIEW COMPARISON:  Abdominal radiograph August 25, 2021 FINDINGS: Nasogastric tube tip projects over the stomach. Cardiac pacemaker lead tip projects over the right ventricle. The bowel gas pattern is normal. Thoracic  spondylosis. Pelvic phleboliths. Degenerative changes bilateral hips. IMPRESSION: Stable nasogastric tube with nonobstructive bowel gas pattern. Electronically Signed   By: Dahlia Bailiff M.D.   On: 08/27/2021 07:58   DG Chest Port 1 View  Result Date: 08/27/2021 CLINICAL DATA:  Respiratory failure due to hypoxia. EXAM: PORTABLE CHEST 1 VIEW COMPARISON:  Chest Radiograph August 25, 2021 FINDINGS: Endotracheal tube with tip projecting over the midthoracic trachea. Enteric tube coursing below the diaphragm with tip projecting over the stomach. Left IJ CVC with tip projecting over the SVC. Single lead left chest cardiac pacemaker with tip projecting over the right ventricle. Cardiac enlargement and central vascular prominence is similar prior. Similar bilateral interstitial thickening and patchy airspace opacities. No visible pleural effusion or pneumothorax. No acute osseous abnormality. IMPRESSION: Similar enlarged cardiac silhouette and central vascular prominence, with bilateral interstitial and alveolar opacities. Electronically Signed   By: Dahlia Bailiff M.D.   On: 08/27/2021 07:57   DG Abd 1 View  Result Date: 08/25/2021 CLINICAL DATA:  NG tube verification. EXAM: ABDOMEN - 1 VIEW COMPARISON:  Radiograph August 24, 2021 FINDINGS: Nasogastric tube tip and side port projecting over the stomach. Left chest pacemaker with unchanged position of the cardiac leads. Bilateral interstitial opacities better seen on same day chest radiograph. The bowel gas pattern is normal. IMPRESSION: Nasogastric tube with tip and side port projecting over the stomach. Electronically Signed   By: Dahlia Bailiff M.D.   On: 08/25/2021 10:19   DG Chest Port 1 View  Result Date: 08/25/2021 CLINICAL DATA:  Respiratory distress EXAM: PORTABLE CHEST 1 VIEW COMPARISON:  08/23/2021 FINDINGS: Nasogastric tube with the tip projecting over the stomach. Single lead cardiac pacemaker. Mild bilateral interstitial thickening and patchy alveolar  airspace opacities. No pleural effusion or pneumothorax. Stable cardiomegaly. No acute osseous abnormality. IMPRESSION: 1. Bilateral interstitial and alveolar airspace opacities which may reflect mild pulmonary edema versus multifocal pneumonia. No significant interval change compared with the prior exam. Electronically Signed   By: Kathreen Devoid M.D.   On: 08/25/2021 08:15   ECHOCARDIOGRAM COMPLETE  Result Date: 08/24/2021    ECHOCARDIOGRAM REPORT   Patient Name:   Otila Back Date of Exam: 08/24/2021 Medical Rec #:  [id number]        Height:       70.0 in Accession #:    [id number]       Weight:       187.4 lb Date of Birth:  1935/11/04         BSA:          2.030 m Patient Age:  86 years         BP:           98/60 mmHg Patient Gender: M                HR:           60 bpm. Exam Location:  [facility] Procedure: 2D Echo Indications:     Cardiomyopathy I42.9  History:         Patient has prior history of Echocardiogram examinations, most                  recent 03/25/2018.  Sonographer:     Kathlen Brunswick RDCS Referring Phys:  [id number] BRITTON L R[place] Diagnosing Phys: Skeet Latch MD  Sonographer Comments: Echo performed with patient supine and on artificial respirator. IMPRESSIONS  1. Left ventricular ejection fraction, by estimation, is 25 to 30%. The left ventricle has severely decreased function. The left ventricle demonstrates global hypokinesis. There is moderate concentric left ventricular hypertrophy. Left ventricular diastolic parameters are consistent with Grade II diastolic dysfunction (pseudonormalization). Elevated left atrial pressure.  2. Right ventricular systolic function is normal. The right ventricular size is normal.  3. Left atrial size was severely dilated.  4. Right atrial size was mildly dilated.  5. The mitral valve is normal in structure. Trivial mitral valve regurgitation. No evidence of mitral stenosis.  6. Severely restricted motion of the left and right coronary cusps.  Aortic valve area by planimitry is 0.3. Dimensionless index 0.3. Suspect low-flow, low-gradient aortic stenosis. Likely moderate to severe aortic stenosis. The aortic valve is calcified. There is severe calcifcation of the aortic valve. There is severe thickening of the aortic valve. Aortic valve regurgitation is not visualized. No aortic stenosis is present.  7. Aortic dilatation noted. There is mild dilatation of the aortic root, measuring 38 mm.  8. The inferior vena cava is normal in size with greater than 50% respiratory variability, suggesting right atrial pressure of 3 mmHg. FINDINGS  Left Ventricle: Left ventricular ejection fraction, by estimation, is 25 to 30%. The left ventricle has severely decreased function. The left ventricle demonstrates global hypokinesis. The left ventricular internal cavity size was normal in size. There is moderate concentric left ventricular hypertrophy. Left ventricular diastolic parameters are consistent with Grade II diastolic dysfunction (pseudonormalization). Elevated left atrial pressure. Right Ventricle: The right ventricular size is normal. No increase in right ventricular wall thickness. Right ventricular systolic function is normal. Left Atrium: Left atrial size was severely dilated. Right Atrium: Right atrial size was mildly dilated. Pericardium: There is no evidence of pericardial effusion. Mitral Valve: The mitral valve is normal in structure. Mild mitral annular calcification. Trivial mitral valve regurgitation. No evidence of mitral valve stenosis. Tricuspid Valve: The tricuspid valve is normal in structure. Tricuspid valve regurgitation is mild . No evidence of tricuspid stenosis. Aortic Valve: Severely restricted motion of the left and right coronary cusps. Aortic valve area by planimitry is 0.3. Dimensionless index 0.3. Suspect low-flow, low-gradient aortic stenosis. Likely moderate to severe aortic stenosis. The aortic valve is  calcified. There is severe  calcifcation of the aortic valve. There is severe thickening of the aortic valve. Aortic valve regurgitation is not visualized. No aortic stenosis is present. Aortic valve mean gradient measures 9.0 mmHg. Aortic valve peak gradient measures 15.5 mmHg. Aortic valve area, by VTI measures 1.15 cm. Pulmonic Valve: The pulmonic valve was normal in structure. Pulmonic valve regurgitation is not visualized. No evidence of pulmonic stenosis. Aorta: Aortic dilatation  noted. There is mild dilatation of the aortic root, measuring 38 mm. Venous: The inferior vena cava is normal in size with greater than 50% respiratory variability, suggesting right atrial pressure of 3 mmHg. IAS/Shunts: No atrial level shunt detected by color flow Doppler. Additional Comments: A device lead is visualized.  LEFT VENTRICLE PLAX 2D LVIDd:         4.97 cm      Diastology LVIDs:         4.21 cm      LV e' medial:    3.70 cm/s LV PW:         1.53 cm      LV E/e' medial:  20.5 LV IVS:        1.30 cm      LV e' lateral:   5.00 cm/s LVOT diam:     2.20 cm      LV E/e' lateral: 15.2 LV SV:         46 LV SV Index:   23 LVOT Area:     3.80 cm  LV Volumes (MOD) LV vol d, MOD A2C: 120.0 ml LV vol d, MOD A4C: 160.0 ml LV vol s, MOD A2C: 73.9 ml LV vol s, MOD A4C: 105.0 ml LV SV MOD A2C:     46.1 ml LV SV MOD A4C:     160.0 ml LV SV MOD BP:      53.6 ml RIGHT VENTRICLE RV Basal diam:  3.45 cm RV S prime:     8.49 cm/s TAPSE (M-mode): 1.2 cm LEFT ATRIUM              Index        RIGHT ATRIUM           Index LA diam:        6.30 cm  3.10 cm/m   RA Area:     21.70 cm LA Vol (A2C):   111.0 ml 54.67 ml/m  RA Volume:   64.60 ml  31.82 ml/m LA Vol (A4C):   98.0 ml  48.27 ml/m LA Biplane Vol: 105.0 ml 51.72 ml/m  AORTIC VALVE                    PULMONIC VALVE AV Area (Vmax):    1.07 cm     PV Vmax:       0.81 m/s AV Area (VTI):     1.15 cm     PV Peak grad:  2.7 mmHg AV Vmax:           197.10 cm/s AV VTI:            0.401 m AV Peak Grad:      15.5 mmHg AV  Mean Grad:      9.0 mmHg LVOT Vmax:         55.50 cm/s LVOT Vmean:        33.700 cm/s LVOT VTI:          0.121 m LVOT/AV VTI ratio: 0.30  AORTA Ao Root diam: 3.80 cm Ao Asc diam:  3.30 cm MITRAL VALVE               TRICUSPID VALVE MV Area (PHT): 2.62 cm    TV Peak grad:   22.0 mmHg MV Decel Time: 289 msec    TV Vmax:        2.35 m/s MV E velocity: 75.80 cm/s  TR Peak grad:   21.2 mmHg  TR Vmax:        230.00 cm/s                             SHUNTS                            Systemic VTI:  0.12 m                            Systemic Diam: 2.20 cm Skeet Latch MD Electronically signed by Skeet Latch MD Signature Date/Time: 08/24/2021/1:38:28 PM    Final    DG Abd 2 Views  Result Date: 08/24/2021 CLINICAL DATA:  Follow-up small bowel obstruction. EXAM: ABDOMEN - 2 VIEW COMPARISON:  08/23/2021 FINDINGS: Enteric tube tip and side port are in the gastric fundus. Gallstones identified in the right upper quadrant of the abdomen. No dilated loops of small or large bowel. Gas identified within the colon up to the rectum. IMPRESSION: 1. Nonobstructive bowel gas pattern. 2. Cholelithiasis. Electronically Signed   By: Kerby Moors M.D.   On: 08/24/2021 07:15   DG Chest Port 1 View  Result Date: 08/23/2021 CLINICAL DATA:  Status post endotracheal tube, central line and orogastric tube placement. EXAM: PORTABLE CHEST 1 VIEW COMPARISON:  August 22, 2021 FINDINGS: Since the prior study, there is been interval endotracheal tube placement with its distal tip approximately 5.5 cm from the carina. Interval orogastric placement is also seen with its distal tip overlying the expected region of the gastric fundus. A left internal jugular venous catheter is present with its distal tip overlying the mid superior vena cava. This is approximately 3.4 cm proximal to the junction of the superior vena cava and right atrium. Stable single lead ventricular AICD positioning is noted. Mild to moderate  severity bilateral multifocal infiltrates are seen. This is increased in severity when compared to the prior exam. No pleural effusion or pneumothorax is identified. The heart size and mediastinal contours are within normal limits. There is moderate severity calcification of the thoracic aorta. The visualized skeletal structures are unremarkable. IMPRESSION: 1. Mild to moderate severity bilateral multifocal infiltrates, increased in severity when compared to the prior exam. 2. Interval endotracheal tube, orogastric tube and left internal jugular venous catheter placement and positioning, as described above. Electronically Signed   By: Virgina Norfolk M.D.   On: 08/23/2021 03:01   DG Abd 1 View  Result Date: 08/23/2021 CLINICAL DATA:  Orogastric tube placement EXAM: ABDOMEN - 1 VIEW COMPARISON:  None Available. FINDINGS: Tip of the orogastric tube projects over the left hemidiaphragm. Proximal course is not visualized. Could be in the left lower lobe bronchus. IMPRESSION: Orogastric tube tip projecting over the left hemidiaphragm, possibly in the left lower lobe bronchus. Replacement and/or chest radiograph recommended. These results will be called to the ordering clinician or representative by the Radiologist Assistant, and communication documented in the PACS or [employer]. Electronically Signed   By: Ulyses Jarred M.D.   On: 08/23/2021 01:07   CT HEAD WO CONTRAST (5MM)  Result Date: 08/22/2021 CLINICAL DATA:  Sepsis EXAM: CT HEAD WITHOUT CONTRAST TECHNIQUE: Contiguous axial images were obtained from the base of the skull through the vertex without intravenous contrast. RADIATION DOSE REDUCTION: This exam was performed according to the departmental dose-optimization program which includes automated exposure control, adjustment of the mA and/or kV according to patient  size and/or use of iterative reconstruction technique. COMPARISON:  None Available. FINDINGS: Brain: Motion degradation on multiple  images. No acute territorial infarction, hemorrhage or intracranial mass. Atrophy and chronic small vessel ischemic changes of the white matter. Small chronic infarcts in the right cerebellum. Vascular: No hyperdense vessels.  Carotid vascular calcification. Skull: Normal. Negative for fracture or focal lesion. Sinuses/Orbits: Patchy mucosal thickening in the sinuses Other: None IMPRESSION: 1. Motion degraded study 2. No CT evidence for acute intracranial abnormality. 3. Atrophy and chronic small vessel ischemic changes of the white matter Electronically Signed   By: Donavan Foil M.D.   On: 08/22/2021 23:53   CT ABDOMEN PELVIS WO CONTRAST  Result Date: 08/22/2021 CLINICAL DATA:  Abdomen pain sepsis EXAM: CT ABDOMEN AND PELVIS WITHOUT CONTRAST TECHNIQUE: Multidetector CT imaging of the abdomen and pelvis was performed following the standard protocol without IV contrast. RADIATION DOSE REDUCTION: This exam was performed according to the departmental dose-optimization program which includes automated exposure control, adjustment of the mA and/or kV according to patient size and/or use of iterative reconstruction technique. COMPARISON:  None Available. FINDINGS: Lower chest: Lung bases demonstrate heterogeneous consolidation and ground-glass density in the bilateral lower lobes, right middle lobe and lingula, suspicious for pneumonia. Small bilateral pleural effusions. Cardiomegaly. Partially visualized pacing leads. Coronary vascular calcification Hepatobiliary: Gallstones. No focal hepatic abnormality. No biliary dilatation Pancreas: Unremarkable. No pancreatic ductal dilatation or surrounding inflammatory changes. Spleen: Normal in size without focal abnormality. Adrenals/Urinary Tract: Adrenal glands are within normal limits. Kidneys show no hydronephrosis. Multiple low-density lesions, likely cysts. No specific imaging follow-up is recommended. Foley catheter in the bladder which is decompressed Stomach/Bowel:  Marked fluid distension of the stomach. Multiple dilated fluid-filled loops of proximal and mid small bowel, consistent with obstruction. Transition point visualized in the distal small bowel in the left lower quadrant, series 2, image 69 and sagittal series 6 image 87 where there is beaked appearance of the small bowel. Small bowel distal to this within the anterior pelvis is completely decompressed. No acute bowel wall thickening. Frothy stools in the colon. Slight swirling appearance of the mesentery in the region of transition point, series 2, image 64 through 67. Vascular/Lymphatic: Moderate aortic atherosclerosis. No aneurysm. No suspicious lymph nodes. Edema and mesenteric vascular congestion associated with obstructed bowel loops, series 2, image 65. Reproductive: Enlarged prostate. Other: No free air. Small volume free fluid within the abdomen and pelvis. Generalized subcutaneous edema consistent with anasarca. Musculoskeletal: No acute osseous abnormality IMPRESSION: 1. Marked fluid distension of the stomach with multiple dilated fluid-filled loops of proximal and mid small bowel consistent with obstruction. Point of transition within the left lower quadrant distal small bowel slightly inferior to the umbilicus where there is slightly decreased appearance of the small bowel on sagittal images and slight swirling appearance of the mesentery. Findings could be secondary to internal hernia, incomplete volvulus, or adhesive disease. There is mesenteric vascular congestion associated with obstructed small bowel loops without gross bowel wall thickening or frank intramural air at this time. No free air. Small volume ascites within the abdomen and pelvis. 2. Heterogeneous airspace disease and consolidations at the lung bases, right middle lobe and lingula suspicious for multifocal pneumonia. Potential component of aspiration at the lung bases given findings of bowel obstruction in the abdomen. Suggest NG tube  decompression of the stomach 3. Cardiomegaly with small pleural effusions. Generalized subcutaneous edema consistent with anasarca. 4. Enlarged prostate 5. Gallstones Electronically Signed   By: Madie Reno.D.  On: 08/22/2021 23:50   DG Chest Portable 1 View  Result Date: 08/22/2021 CLINICAL DATA:  Dyspnea and respiratory distress. EXAM: PORTABLE CHEST 1 VIEW COMPARISON:  05/19/2021. FINDINGS: There is a left chest wall pacer device with lead in the right ventricle. Stable cardiomediastinal contours. Aortic atherosclerosis. No pleural effusion or edema. Bilateral multifocal airspace opacities are identified concerning for multifocal pneumonia. IMPRESSION: Bilateral multifocal airspace opacities concerning for multifocal pneumonia. Electronically Signed   By: Kerby Moors M.D.   On: 08/22/2021 18:49    Microbiology: Results for orders placed or performed during the hospital encounter of 08/22/21  Blood culture (routine x 2)     Status: None   Collection Time: 08/22/21  7:29 PM   Specimen: BLOOD  Result Value Ref Range Status   Specimen Description BLOOD LEFT ANTECUBITAL  Final   Special Requests   Final    BOTTLES DRAWN AEROBIC AND ANAEROBIC Blood Culture results may not be optimal due to an excessive volume of blood received in culture bottles   Culture   Final    NO GROWTH 5 DAYS Performed at [facility], [address] [place], [place] [address]    Report Status 08/27/2021 FINAL  Final  Blood culture (routine x 2)     Status: None   Collection Time: 08/22/21  7:29 PM   Specimen: BLOOD  Result Value Ref Range Status   Specimen Description BLOOD BLOOD RIGHT HAND  Final   Special Requests   Final    BOTTLES DRAWN AEROBIC AND ANAEROBIC Blood Culture results may not be optimal due to an inadequate volume of blood received in culture bottles   Culture   Final    NO GROWTH 5 DAYS Performed at [facility], [address] [place], [place] [address]    Report  Status 08/27/2021 FINAL  Final  Resp Panel by RT-PCR (Flu A&B, Covid) Anterior Nasal Swab     Status: None   Collection Time: 08/22/21  7:29 PM   Specimen: Anterior Nasal Swab  Result Value Ref Range Status   SARS Coronavirus 2 by RT PCR NEGATIVE NEGATIVE Final    Comment: (NOTE) SARS-CoV-2 target nucleic acids are NOT DETECTED.  The SARS-CoV-2 RNA is generally detectable in upper respiratory specimens during the acute phase of infection. The lowest concentration of SARS-CoV-2 viral copies this assay can detect is 138 copies/mL. A negative result does not preclude SARS-Cov-2 infection and should not be used as the sole basis for treatment or other patient management decisions. A negative result may occur with  improper specimen collection/handling, submission of specimen other than nasopharyngeal swab, presence of viral mutation(s) within the areas targeted by this assay, and inadequate number of viral copies(<138 copies/mL). A negative result must be combined with clinical observations, patient history, and epidemiological information. The expected result is Negative.  Fact Sheet for Patients:  EntrepreneurPulse.com.au  Fact Sheet for Healthcare Providers:  IncredibleEmployment.be  This test is no t yet approved or cleared by the [place] FDA and  has been authorized for detection and/or diagnosis of SARS-CoV-2 by FDA under an Emergency Use Authorization (EUA). This EUA will remain  in effect (meaning this test can be used) for the duration of the COVID-19 declaration under Section 564(b)(1) of the Act, 21 U.S.C.section 360bbb-3(b)(1), unless the authorization is terminated  or revoked sooner.       Influenza A by PCR NEGATIVE NEGATIVE Final   Influenza B by PCR NEGATIVE NEGATIVE Final    Comment: (NOTE)  The Xpert Xpress SARS-CoV-2/FLU/RSV plus assay is intended as an aid in the diagnosis of influenza from Nasopharyngeal swab  specimens and should not be used as a sole basis for treatment. Nasal washings and aspirates are unacceptable for Xpert Xpress SARS-CoV-2/FLU/RSV testing.  Fact Sheet for Patients: EntrepreneurPulse.com.au  Fact Sheet for Healthcare Providers: IncredibleEmployment.be  This test is not yet approved or cleared by the [place] FDA and has been authorized for detection and/or diagnosis of SARS-CoV-2 by FDA under an Emergency Use Authorization (EUA). This EUA will remain in effect (meaning this test can be used) for the duration of the COVID-19 declaration under Section 564(b)(1) of the Act, 21 U.S.C. section 360bbb-3(b)(1), unless the authorization is terminated or revoked.  Performed at [facility], [address] [place], [place] [address]   MRSA Next Gen by PCR, Nasal     Status: None   Collection Time: 08/23/21 12:26 AM   Specimen: Nasal Mucosa; Nasal Swab  Result Value Ref Range Status   MRSA by PCR Next Gen NOT DETECTED NOT DETECTED Final    Comment: (NOTE) The GeneXpert MRSA Assay (FDA approved for NASAL specimens only), is one component of a comprehensive MRSA colonization surveillance program. It is not intended to diagnose MRSA infection nor to guide or monitor treatment for MRSA infections. Test performance is not FDA approved in patients less than 24 years old. Performed at [facility], [address] [place], [place] [address]   Culture, BAL-quantitative w Gram Stain     Status: Abnormal   Collection Time: 08/27/21 12:36 PM   Specimen: Bronchoalveolar Lavage; Respiratory  Result Value Ref Range Status   Specimen Description   Final    BRONCHIAL ALVEOLAR LAVAGE Performed at [facility], [address], [place], [place] [address]    Special Requests   Final    Immunocompromised Performed at [facility], [address] [place], [place] [address]    Gram Stain   Final     ABUNDANT WBC PRESENT, PREDOMINANTLY PMN FEW YEAST WITH PSEUDOHYPHAE Performed at [facility] [address] [place],  [address]    Culture (A)  Final    80,000 COLONIES/mL CANDIDA TROPICALIS 20,000 COLONIES/mL CANDIDA ALBICANS    Report Status 08/30/2021 FINAL  Final    Labs: CBC: Recent Labs  Lab 08/28/21 0521 08/29/21 0513  WBC 7.7 8.0  HGB 8.2* 9.1*  HCT 25.5* 27.0*  MCV 93.4 91.2  PLT 189 170   Basic Metabolic Panel: Recent Labs  Lab 08/26/21 0415 08/27/21 0235 08/28/21 0521 08/29/21 0513 08/30/21 0326  NA 127* 137 139 143  --   K 4.2 3.6 3.8 3.6  --   CL 96* 106 106 110  --   CO2 _0 --   GLUCOSE 152* 93 212* 157*  --   BUN 64* 68* 70* 65*  --   CREATININE 1.03 1.11 1.00 0.93  --   CALCIUM 8.1* 7.9* 8.4* 9.0  --   MG 2.0  --  2.4 2.4  --   PHOS 3.6  --   --  2.7 2.3*   Liver Function Tests: No results for input(s): "AST", "ALT", "ALKPHOS", "BILITOT", "PROT", "ALBUMIN" in the last 168 hours. CBG: Recent Labs  Lab 08/31/21 0758 08/31/21 1114 08/31/21 1540 08/31/21 2033 09/01/21 0731  GLUCAP 176* 236* 284* 130* 147*    Discharge time spent: greater than 30 minutes.  Signed: Fritzi Mandes, MD [facility] 09/01/2021

## 2021-09-01 NOTE — Consult Note (Signed)
ANTICOAGULATION CONSULT NOTE - Follow Up Consult  Pharmacy Consult for warfarin Indication: atrial fibrillation  Allergies  Allergen Reactions   Methyldopa Nausea Only and Palpitations    Other reaction(s): Unknown Other reaction(s): NAUSEA 'palpitation" Other reaction(s): NAUSEA Other reaction(s): NAUSEA     Patient Measurements: Height: 5\' 10"  (177.8 cm) Weight: 80.9 kg (178 lb 5.6 oz) IBW/kg (Calculated) : 73  Vital Signs: Temp: 98.2 F (36.8 C) (07/31 1151) Temp Source: Oral (07/31 0408) BP: 137/55 (07/31 0730) Pulse Rate: 59 (07/31 0730)  Labs: Recent Labs    08/30/21 0326 08/31/21 0321 09/01/21 0509  LABPROT 22.0* 27.2* 32.0*  INR 1.9* 2.6* 3.1*    Estimated Creatinine Clearance: 61.1 mL/min (by C-G formula based on SCr of 0.93 mg/dL).   Medications:  Medications Prior to Admission  Medication Sig Dispense Refill Last Dose   amLODipine (NORVASC) 2.5 MG tablet Take 2.5 mg by mouth daily.   08/22/2021   cephALEXin (KEFLEX) 500 MG capsule Take 500 mg by mouth 2 (two) times daily.   08/22/2021   glimepiride (AMARYL) 2 MG tablet Take 4 mg by mouth daily.   08/22/2021   lisinopril (ZESTRIL) 20 MG tablet Take 20 mg by mouth daily.   08/22/2021   memantine (NAMENDA) 10 MG tablet Take 10 mg by mouth daily.   08/22/2021   metFORMIN (GLUCOPHAGE) 1000 MG tablet Take 1,000 mg by mouth 2 (two) times daily.   08/22/2021   pioglitazone (ACTOS) 15 MG tablet Take 15 mg by mouth daily.    08/22/2021   Potassium 99 MG TABS Take 1.5 tablets by mouth daily.   08/22/2021   vitamin B-12 (CYANOCOBALAMIN) 1000 MCG tablet Take 1,000 mcg by mouth daily.   08/22/2021   [DISCONTINUED] furosemide (LASIX) 80 MG tablet Take 80 mg by mouth daily.   08/22/2021   [DISCONTINUED] warfarin (COUMADIN) 2.5 MG tablet Take 1.25-2.5 mg by mouth as directed. Take 1/2 tablet (1.25 mg) on Tues,Thur,Sunday. Take 1 tablet (2.5 mg) on Monday, Wednesday, Friday, Saturday.  (As of anticoag visit note from 08/14/21)    08/22/2021   donepezil (ARICEPT) 10 MG tablet Take 10 mg by mouth at bedtime.   08/21/2021   doxazosin (CARDURA) 4 MG tablet Take 4 mg by mouth Nightly.   08/21/2021   guaiFENesin-dextromethorphan (ROBITUSSIN DM) 100-10 MG/5ML syrup Take 5 mLs by mouth every 4 (four) hours as needed for cough. 118 mL 0 prn at prn   simvastatin (ZOCOR) 20 MG tablet Take 20 mg by mouth Nightly.   08/21/2021   Scheduled:   amLODipine  2.5 mg Oral Daily   vitamin C  500 mg Oral BID   budesonide (PULMICORT) nebulizer solution  0.5 mg Nebulization BID   Chlorhexidine Gluconate Cloth  6 each Topical Daily   cyanocobalamin  1,000 mcg Oral Daily   docusate  50 mg Oral BID   donepezil  10 mg Oral QHS   doxazosin  4 mg Oral Nightly   fluconazole  100 mg Oral Daily   glimepiride  4 mg Oral Daily   insulin aspart  0-5 Units Subcutaneous QHS   insulin aspart  0-9 Units Subcutaneous TID WC   lisinopril  20 mg Oral Daily   memantine  10 mg Oral QHS   multivitamin with minerals  1 tablet Oral Daily   pantoprazole  40 mg Oral Daily   polyethylene glycol  17 g Oral Daily   Ensure Max Protein  11 oz Oral TID   QUEtiapine  25 mg Oral  QHS   Warfarin - Pharmacist Dosing Inpatient   Does not apply q1600   Infusions:  PRN: acetaminophen, guaiFENesin-dextromethorphan, ipratropium-albuterol, mouth rinse Anti-infectives (From admission, onward)    Start     Dose/Rate Route Frequency Ordered Stop   09/01/21 0000  fluconazole (DIFLUCAN) 100 MG tablet        100 mg Oral Daily 09/01/21 1018 09/07/21 2359   08/31/21 1200  fluconazole (DIFLUCAN) tablet 100 mg        100 mg Oral Daily 08/31/21 1030 09/07/21 0959   08/26/21 0800  cefTRIAXone (ROCEPHIN) 2 g in sodium chloride 0.9 % 100 mL IVPB        2 g 200 mL/hr over 30 Minutes Intravenous Every 24 hours 08/25/21 1030 08/27/21 0756   08/25/21 1200  cefTRIAXone (ROCEPHIN) 1 g in sodium chloride 0.9 % 100 mL IVPB        1 g 200 mL/hr over 30 Minutes Intravenous  Once 08/25/21 1030  08/25/21 1309   08/23/21 0800  cefTRIAXone (ROCEPHIN) 1 g in sodium chloride 0.9 % 100 mL IVPB  Status:  Discontinued        1 g 200 mL/hr over 30 Minutes Intravenous Every 24 hours 08/22/21 2208 08/25/21 1030   08/23/21 0000  doxycycline (VIBRAMYCIN) 100 mg in sodium chloride 0.9 % 250 mL IVPB        100 mg 125 mL/hr over 120 Minutes Intravenous Every 12 hours 08/22/21 2208 08/27/21 1458   08/22/21 1930  vancomycin (VANCOREADY) IVPB 1750 mg/350 mL        1,750 mg 175 mL/hr over 120 Minutes Intravenous  Once 08/22/21 1915 08/22/21 2358   08/22/21 1915  ceFEPIme (MAXIPIME) 2 g in sodium chloride 0.9 % 100 mL IVPB        2 g 200 mL/hr over 30 Minutes Intravenous STAT 08/22/21 1914 08/22/21 2024       Assessment: Patient is an 55 YOM on warfarin for Afib prior to admission. Upon presentation, INR was supratherapeutic and home warfarin was held. Patient's home regimen was warfarin 1.25 mg TThSu and 2.5 mg MWFSa. Patient was started on fluconazole 08/31/2021 which can increase INR. CBC stable.  Date INR Plan  7/21 N/A 2.5 mg (presumed, PTA)  7/22 5.3 Held  7/23 4.9 Held  7/24 4.1 Held  7/25 3.3 Held  7/26 3.3 Held (hemoptysis reported)  7/27 3.2 Held (hemoptysis resolved)  7/28 2.4 2.5 mg  7/29 1.9 2.5 mg  7/30 2.6 1.25 mg  7/31 3.1 1.25 mg (discharge)    Goal of Therapy:  INR 2-3 Monitor platelets by anticoagulation protocol: Yes   Plan:  INR is slightly supratherapeutic likely due to 2.5 mg dose on 07/29 and addition of fluconazole. Continue with warfarin 1.25 mg x6 days while on fluconazole to decrease likelihood of supratherapeutic INR. Predict INR to stabilize in therapeutic range in the next 2 days. Consider resuming home regimen after completion of fluconazole course (09/17/2021), though patient may need dose reduction as he came in with a supratherapeutic INR.   Celene Squibb, PharmD PGY1 Pharmacy Resident 09/01/2021 12:20 PM

## 2021-09-01 NOTE — Progress Notes (Signed)
Speech Language Pathology Treatment:    Patient Details Name: Andrew [name] MRN: [id number] DOB: 1935-04-17 Today's Date: 09/01/2021 Time: 2841-3244 SLP Time Calculation (min) (ACUTE ONLY): 15 min  Assessment / Plan / Recommendation Clinical Impression  Pt seen for diet tolerance/skilled meal observation. Pt alert, pleasantly confused. Pt being fed by NT upon SLP entrance to room.   Per chart review, current temp WNL. No recent WBC or CXR. Pt on room air.   Reviewed diet recommendations and safe swallowing strategies/aspiration precautions with NT.   Pt observed with items from pureed diet with thin liquid tray including, but not limited to, pureed pancakes, magic cup, and cranberry juice. Pt continues to presents with s/sx mild-moderate oral dysphagia on pureed diet with thin liquids. Pt with prolonged oral prep and "mastication" of pureed. Oral clearance achieved with extra time and cues to swallow. Pt appeared to be easily distracted this date and suspect pt's overall mental status is impacting pt's oral swallow function. No overt s/sx pharyngeal dysphagia noted across trials. Pt benefited from intermittent assistance with feeding. When attempting to self feed, pt "overstuffing" oral cavity prior to swallowing, and pt demonstrating rapid rate of intake despite cueing.   Per SLP observation, pt appears to be tolerating current diet; however, pt not appropriate for diet upgrade/trials of upgraded textures given severity of oral s/sx as noted above.  Recommend continuation of a pureed diet with thin liquids and safe swallowing strategies/aspiration precautions as outlined below.   SLP to f/u per POC for trials of upgraded textures pending improvements in pt's mental status. This may be completed at next level of care.    HPI HPI: 86 yo M presenting to [facility] ED from home via EMS with complaints of dyspnea, hypoxia & fatigue on 08/22/2021.  Chest x-ray revealed 08/22/2021 Bilateral multifocal  airspace opacities concerning for multifocal  pneumonia. Chest x-ray on 08/27/2021 revealed Similar enlarged cardiac silhouette and central vascular prominence, with bilateral interstitial and alveolar opacities. Pt intubated on 08/23/2021 thru 08/28/2021.      SLP Plan  Continue with current plan of care      Recommendations for follow up therapy are one component of a multi-disciplinary discharge planning process, led by the attending physician.  Recommendations may be updated based on patient status, additional functional criteria and insurance authorization.    Recommendations  Diet recommendations: Dysphagia 1 (puree);Thin liquid Liquids provided via: Cup;No straw Medication Administration: Crushed with puree Supervision: Full supervision/cueing for compensatory strategies;Staff to assist with self feeding Compensations: Minimize environmental distractions;Slow rate;Small sips/bites Postural Changes and/or Swallow Maneuvers: Out of bed for meals;Seated upright 90 degrees (watch for swallow and oral clearance before next bite/sip)                Oral Care Recommendations: Oral care BID;Staff/trained caregiver to provide oral care Follow Up Recommendations: Skilled nursing-short term rehab (<3 hours/day) Assistance recommended at discharge: Frequent or constant Supervision/Assistance SLP Visit Diagnosis: Dysphagia, oropharyngeal phase (R13.12) Plan: Continue with current plan of care          Clyde Canterbury, M.S., CCC-SLP Speech-Language Pathologist [facility] [phone] (ASCOM)   Woodroe Chen  09/01/2021, 10:10 AM

## 2021-09-01 NOTE — TOC Transition Note (Signed)
Transition of Care [facility]) - CM/SW Discharge Note   Patient Details  Name: Andrew [name] MRN: [id number] Date of Birth: 1935/09/05  Transition of Care [facility]) CM/SW Contact:  Chapman Fitch, RN Phone Number: 09/01/2021, 10:46 AM   Clinical Narrative:    Patient will DC to: [facility] Anticipated DC date: 09/01/21  Family notified:VM left for wife Transport [id number]  Per MD patient ready for DC to . RN, , patient's family, and facility notified of DC. Discharge Summary sent to facility. RN given number for report. DC packet on chart. Ambulance transport requested for patient.  TOC signing off.  Bevelyn Ngo [facility] [phone]      Barriers to Discharge: Continued Medical Work up   Patient Goals and CMS Choice Patient states their goals for this hospitalization and ongoing recovery are:: Family wants the patient to get better and get back home, agrees to rehab CMS Medicare.gov Compare Post Acute Care list provided to:: Patient Represenative (must comment) Choice offered to / list presented to : Spouse  Discharge Placement                       Discharge Plan and Services   Discharge Planning Services: CM Consult Post Acute Care Choice: Skilled Nursing Facility          DME Arranged: N/A DME Agency: NA                  Social Determinants of Health (SDOH) Interventions     Readmission Risk Interventions     No data to display

## 2021-09-01 NOTE — Plan of Care (Signed)
IV removed, report called to [facility], Reviewed discharge with wife and daughter, EMS called and patient to be transported by EMS

## 2021-09-01 NOTE — TOC Progression Note (Signed)
Transition of Care [facility]) - Progression Note    Patient Details  Name: Andrew [name] MRN: [id number] Date of Birth: 1935-11-04  [facility] of Care [facility]) CM/SW Contact  Chapman Fitch, RN Phone Number: 09/01/2021, 9:11 AM  Clinical Narrative:      Vesta Mixer [phone] valid 7/31-8/3.  MD notified.  Verlon Au at [facility] notified    Expected Discharge Plan: Skilled Nursing Facility Barriers to Discharge: Continued Medical Work up  Expected Discharge Plan and Services Expected Discharge Plan: Skilled Nursing Facility   Discharge Planning Services: CM Consult Post Acute Care Choice: Skilled Nursing Facility Living arrangements for the past 2 months: Single Family Home                 DME Arranged: N/A DME Agency: NA                   Social Determinants of Health (SDOH) Interventions    Readmission Risk Interventions     No data to display

## 2021-09-01 NOTE — Care Management Important Message (Signed)
Important Message  Patient Details  Name: Andrew [name] MRN: [id number] Date of Birth: [birth year]   Medicare Important Message Given:  Yes     [name] 09/01/2021, 11:38 AM

## 2021-09-02 ENCOUNTER — Emergency Department: Payer: Medicare HMO

## 2021-09-02 ENCOUNTER — Encounter: Payer: Self-pay | Admitting: Internal Medicine

## 2021-09-02 ENCOUNTER — Inpatient Hospital Stay
Admission: EM | Admit: 2021-09-02 | Discharge: 2021-09-03 | DRG: 177 | Disposition: A | Discharge status: Hospice | Payer: Medicare HMO | Attending: Internal Medicine | Admitting: Internal Medicine

## 2021-09-02 ENCOUNTER — Other Ambulatory Visit: Payer: Self-pay

## 2021-09-02 DIAGNOSIS — K5730 Diverticulosis of large intestine without perforation or abscess without bleeding: Secondary | ICD-10-CM | POA: Diagnosis not present

## 2021-09-02 DIAGNOSIS — Z7189 Other specified counseling: Secondary | ICD-10-CM | POA: Diagnosis not present

## 2021-09-02 DIAGNOSIS — E119 Type 2 diabetes mellitus without complications: Secondary | ICD-10-CM | POA: Diagnosis not present

## 2021-09-02 DIAGNOSIS — I110 Hypertensive heart disease with heart failure: Secondary | ICD-10-CM | POA: Diagnosis present

## 2021-09-02 DIAGNOSIS — Z87891 Personal history of nicotine dependence: Secondary | ICD-10-CM | POA: Diagnosis not present

## 2021-09-02 DIAGNOSIS — F0390 Unspecified dementia without behavioral disturbance: Secondary | ICD-10-CM | POA: Diagnosis present

## 2021-09-02 DIAGNOSIS — Z888 Allergy status to other drugs, medicaments and biological substances status: Secondary | ICD-10-CM | POA: Diagnosis not present

## 2021-09-02 DIAGNOSIS — J851 Abscess of lung with pneumonia: Secondary | ICD-10-CM | POA: Diagnosis present

## 2021-09-02 DIAGNOSIS — Z950 Presence of cardiac pacemaker: Secondary | ICD-10-CM | POA: Diagnosis not present

## 2021-09-02 DIAGNOSIS — J9600 Acute respiratory failure, unspecified whether with hypoxia or hypercapnia: Secondary | ICD-10-CM | POA: Diagnosis not present

## 2021-09-02 DIAGNOSIS — E782 Mixed hyperlipidemia: Secondary | ICD-10-CM | POA: Diagnosis present

## 2021-09-02 DIAGNOSIS — I4891 Unspecified atrial fibrillation: Secondary | ICD-10-CM | POA: Diagnosis not present

## 2021-09-02 DIAGNOSIS — I5032 Chronic diastolic (congestive) heart failure: Secondary | ICD-10-CM | POA: Diagnosis present

## 2021-09-02 DIAGNOSIS — K8020 Calculus of gallbladder without cholecystitis without obstruction: Secondary | ICD-10-CM | POA: Diagnosis not present

## 2021-09-02 DIAGNOSIS — I480 Paroxysmal atrial fibrillation: Secondary | ICD-10-CM | POA: Diagnosis present

## 2021-09-02 DIAGNOSIS — R000 Tachycardia, unspecified: Secondary | ICD-10-CM | POA: Diagnosis not present

## 2021-09-02 DIAGNOSIS — Z7984 Long term (current) use of oral hypoglycemic drugs: Secondary | ICD-10-CM | POA: Diagnosis not present

## 2021-09-02 DIAGNOSIS — I248 Other forms of acute ischemic heart disease: Secondary | ICD-10-CM | POA: Diagnosis present

## 2021-09-02 DIAGNOSIS — Z66 Do not resuscitate: Secondary | ICD-10-CM | POA: Diagnosis present

## 2021-09-02 DIAGNOSIS — Z823 Family history of stroke: Secondary | ICD-10-CM | POA: Diagnosis not present

## 2021-09-02 DIAGNOSIS — K529 Noninfective gastroenteritis and colitis, unspecified: Secondary | ICD-10-CM | POA: Diagnosis present

## 2021-09-02 DIAGNOSIS — J852 Abscess of lung without pneumonia: Secondary | ICD-10-CM | POA: Diagnosis not present

## 2021-09-02 DIAGNOSIS — N281 Cyst of kidney, acquired: Secondary | ICD-10-CM | POA: Diagnosis not present

## 2021-09-02 DIAGNOSIS — I5042 Chronic combined systolic (congestive) and diastolic (congestive) heart failure: Secondary | ICD-10-CM | POA: Diagnosis present

## 2021-09-02 DIAGNOSIS — J9690 Respiratory failure, unspecified, unspecified whether with hypoxia or hypercapnia: Secondary | ICD-10-CM | POA: Diagnosis not present

## 2021-09-02 DIAGNOSIS — Z79899 Other long term (current) drug therapy: Secondary | ICD-10-CM | POA: Diagnosis not present

## 2021-09-02 DIAGNOSIS — Z515 Encounter for palliative care: Secondary | ICD-10-CM

## 2021-09-02 DIAGNOSIS — Z7901 Long term (current) use of anticoagulants: Secondary | ICD-10-CM | POA: Diagnosis not present

## 2021-09-02 DIAGNOSIS — A419 Sepsis, unspecified organism: Secondary | ICD-10-CM | POA: Diagnosis not present

## 2021-09-02 DIAGNOSIS — E870 Hyperosmolality and hypernatremia: Secondary | ICD-10-CM | POA: Diagnosis not present

## 2021-09-02 DIAGNOSIS — R410 Disorientation, unspecified: Secondary | ICD-10-CM | POA: Diagnosis not present

## 2021-09-02 DIAGNOSIS — Z20822 Contact with and (suspected) exposure to covid-19: Secondary | ICD-10-CM | POA: Diagnosis present

## 2021-09-02 DIAGNOSIS — R0602 Shortness of breath: Secondary | ICD-10-CM | POA: Diagnosis not present

## 2021-09-02 DIAGNOSIS — D649 Anemia, unspecified: Secondary | ICD-10-CM | POA: Diagnosis present

## 2021-09-02 DIAGNOSIS — E872 Acidosis, unspecified: Secondary | ICD-10-CM | POA: Diagnosis present

## 2021-09-02 DIAGNOSIS — J690 Pneumonitis due to inhalation of food and vomit: Principal | ICD-10-CM | POA: Diagnosis present

## 2021-09-02 DIAGNOSIS — I4821 Permanent atrial fibrillation: Secondary | ICD-10-CM | POA: Diagnosis present

## 2021-09-02 DIAGNOSIS — J189 Pneumonia, unspecified organism: Secondary | ICD-10-CM | POA: Diagnosis not present

## 2021-09-02 DIAGNOSIS — R0689 Other abnormalities of breathing: Secondary | ICD-10-CM | POA: Diagnosis not present

## 2021-09-02 DIAGNOSIS — J90 Pleural effusion, not elsewhere classified: Secondary | ICD-10-CM | POA: Diagnosis not present

## 2021-09-02 DIAGNOSIS — J9811 Atelectasis: Secondary | ICD-10-CM | POA: Diagnosis not present

## 2021-09-02 DIAGNOSIS — R0989 Other specified symptoms and signs involving the circulatory and respiratory systems: Secondary | ICD-10-CM | POA: Diagnosis not present

## 2021-09-02 LAB — BASIC METABOLIC PANEL WITH GFR
Anion gap: 6 (ref 5–15)
BUN: 34 mg/dL — ABNORMAL HIGH (ref 8–23)
CO2: 26 mmol/L (ref 22–32)
Calcium: 8.7 mg/dL — ABNORMAL LOW (ref 8.9–10.3)
Chloride: 120 mmol/L — ABNORMAL HIGH (ref 98–111)
Creatinine, Ser: 0.9 mg/dL (ref 0.61–1.24)
GFR, Estimated: >60 mL/min
Glucose, Bld: 256 mg/dL — ABNORMAL HIGH (ref 70–99)
Potassium: 3.9 mmol/L (ref 3.5–5.1)
Sodium: 152 mmol/L — ABNORMAL HIGH (ref 135–145)

## 2021-09-02 LAB — PROTIME-INR
INR: 3.2 — ABNORMAL HIGH (ref 0.8–1.2)
INR: 3.6 — ABNORMAL HIGH (ref 0.8–1.2)
Prothrombin Time: 32.7 seconds — ABNORMAL HIGH (ref 11.4–15.2)
Prothrombin Time: 35.5 seconds — ABNORMAL HIGH (ref 11.4–15.2)

## 2021-09-02 LAB — BASIC METABOLIC PANEL
Anion gap: 3 — ABNORMAL LOW (ref 5–15)
Anion gap: 10 (ref 5–15)
BUN: 36 mg/dL — ABNORMAL HIGH (ref 8–23)
BUN: 33 mg/dL — ABNORMAL HIGH (ref 8–23)
CO2: 28 mmol/L (ref 22–32)
CO2: 22 mmol/L (ref 22–32)
Calcium: 8.8 mg/dL — ABNORMAL LOW (ref 8.9–10.3)
Calcium: 8.4 mg/dL — ABNORMAL LOW (ref 8.9–10.3)
Chloride: 120 mmol/L — ABNORMAL HIGH (ref 98–111)
Chloride: 119 mmol/L — ABNORMAL HIGH (ref 98–111)
Creatinine, Ser: 0.9 mg/dL (ref 0.61–1.24)
Creatinine, Ser: 0.89 mg/dL (ref 0.61–1.24)
GFR, Estimated: >60 mL/min (ref >60)
GFR, Estimated: >60 mL/min (ref >60)
Glucose, Bld: 297 mg/dL — ABNORMAL HIGH (ref 70–99)
Glucose, Bld: 266 mg/dL — ABNORMAL HIGH (ref 70–99)
Potassium: 4 mmol/L (ref 3.5–5.1)
Potassium: 3.9 mmol/L (ref 3.5–5.1)
Sodium: 151 mmol/L — ABNORMAL HIGH (ref 135–145)
Sodium: 151 mmol/L — ABNORMAL HIGH (ref 135–145)

## 2021-09-02 LAB — CBC
HCT: 27 % — ABNORMAL LOW (ref 39.0–52.0)
Hemoglobin: 8.1 g/dL — ABNORMAL LOW (ref 13.0–17.0)
MCH: 30.5 pg (ref 26.0–34.0)
MCHC: 30 g/dL (ref 30.0–36.0)
MCV: 101.5 fL — ABNORMAL HIGH (ref 80.0–100.0)
Platelets: 170 10*3/uL (ref 150–400)
RBC: 2.66 MIL/uL — ABNORMAL LOW (ref 4.22–5.81)
RDW: 16.2 % — ABNORMAL HIGH (ref 11.5–15.5)
WBC: 14.8 10*3/uL — ABNORMAL HIGH (ref 4.0–10.5)
nRBC: 0 % (ref 0.0–0.2)

## 2021-09-02 LAB — TROPONIN I (HIGH SENSITIVITY)
Troponin I (High Sensitivity): 125 ng/L (ref <18)
Troponin I (High Sensitivity): 124 ng/L (ref <18)
Troponin I (High Sensitivity): 129 ng/L

## 2021-09-02 LAB — BLOOD GAS, VENOUS
Acid-Base Excess: 1.2 mmol/L (ref 0.0–2.0)
Bicarbonate: 26.6 mmol/L (ref 20.0–28.0)
O2 Saturation: 47 %
Patient temperature: 37
pCO2, Ven: 44 mmHg (ref 44–60)
pH, Ven: 7.39 (ref 7.25–7.43)
pO2, Ven: <31 mmHg — CL (ref 32–45)

## 2021-09-02 LAB — LACTIC ACID, PLASMA
Lactic Acid, Venous: 2.6 mmol/L (ref 0.5–1.9)
Lactic Acid, Venous: 2 mmol/L (ref 0.5–1.9)

## 2021-09-02 LAB — BRAIN NATRIURETIC PEPTIDE: B Natriuretic Peptide: 413.1 pg/mL — ABNORMAL HIGH (ref 0.0–100.0)

## 2021-09-02 LAB — GLUCOSE, CAPILLARY: Glucose-Capillary: 246 mg/dL — ABNORMAL HIGH (ref 70–99)

## 2021-09-02 LAB — PROCALCITONIN: Procalcitonin: <0.1 ng/mL

## 2021-09-02 LAB — SARS CORONAVIRUS 2 BY RT PCR: SARS Coronavirus 2 by RT PCR: NEGATIVE

## 2021-09-02 MED ORDER — ACETAMINOPHEN 325 MG PO TABS: 650.0000 mg | ORAL_TABLET | Freq: Four times a day (QID) | ORAL | Status: DC | PRN

## 2021-09-02 MED ORDER — LACTATED RINGERS IV BOLUS
500.0000 mL | Freq: Once | INTRAVENOUS | Status: AC
  Administered 2021-09-02: 500 mL via INTRAVENOUS

## 2021-09-02 MED ORDER — IOHEXOL 350 MG/ML SOLN
100.0000 mL | Freq: Once | INTRAVENOUS | Status: AC | PRN
  Administered 2021-09-02: 100 mL via INTRAVENOUS

## 2021-09-02 MED ORDER — HYDRALAZINE HCL 20 MG/ML IJ SOLN: 10.0000 mg | INTRAMUSCULAR | Status: DC | PRN

## 2021-09-02 MED ORDER — LACTATED RINGERS IV SOLN: INTRAVENOUS | Status: DC

## 2021-09-02 MED ORDER — CLINDAMYCIN PHOSPHATE 600 MG/50ML IV SOLN
600.0000 mg | Freq: Three times a day (TID) | INTRAVENOUS | Status: DC
Start: 2021-09-03 — End: 2021-09-03
  Administered 2021-09-03: 600 mg via INTRAVENOUS
  Filled 2021-09-02 (×2): qty 50

## 2021-09-02 MED ORDER — VANCOMYCIN HCL 1750 MG/350ML IV SOLN
1750.0000 mg | Freq: Once | INTRAVENOUS | Status: AC
  Administered 2021-09-02: 1750 mg via INTRAVENOUS
  Filled 2021-09-02: qty 350

## 2021-09-02 MED ORDER — INSULIN ASPART 100 UNIT/ML IJ SOLN
0.0000 [IU] | INTRAMUSCULAR | Status: DC
  Administered 2021-09-02: 3 [IU] via SUBCUTANEOUS
  Administered 2021-09-03: 1 [IU] via SUBCUTANEOUS
  Filled 2021-09-02 (×2): qty 1

## 2021-09-02 MED ORDER — CLINDAMYCIN PHOSPHATE 600 MG/50ML IV SOLN
600.0000 mg | Freq: Three times a day (TID) | INTRAVENOUS | Status: DC
Start: 2021-09-02 — End: 2021-09-02
  Administered 2021-09-02: 600 mg via INTRAVENOUS
  Filled 2021-09-02 (×4): qty 50

## 2021-09-02 MED ORDER — ACETAMINOPHEN 650 MG RE SUPP: 650.0000 mg | Freq: Four times a day (QID) | RECTAL | Status: DC | PRN

## 2021-09-02 MED ORDER — SODIUM CHLORIDE 0.9 % IV SOLN
2.0000 g | Freq: Once | INTRAVENOUS | Status: AC
  Administered 2021-09-02: 2 g via INTRAVENOUS
  Filled 2021-09-02: qty 12.5

## 2021-09-02 MED ORDER — VANCOMYCIN HCL IN DEXTROSE 1-5 GM/200ML-% IV SOLN: 1000.0000 mg | Freq: Once | INTRAVENOUS | Status: DC

## 2021-09-02 NOTE — ED Triage Notes (Signed)
Pt here via [facility] from [facility] with SOB. Pt does not normally wear oxygen but staff placed him on 3L BNC due to him being SOB and anxious. Pt had a chest x ray this morning showing CHF, pulmonary edema and possible pleural effusion and or pneumonia.

## 2021-09-02 NOTE — Sepsis Progress Note (Signed)
Code Sepsis protocol being monitored by eLink. 

## 2021-09-02 NOTE — ED Notes (Signed)
Lab will add troponin on.

## 2021-09-02 NOTE — H&P (Addendum)
History and Physical    Andrew [name] CBU:[id number] DOB: [birth year] DOA: 09/02/2021  PCP: Baxter Hire, MD  Patient coming from: [place].  History obtained from patient's daughter.  Patient has encephalopathy/dementia.  Chief Complaint: Shortness of breath.  HPI: Andrew [name] is a 86 y.o. male with history of chronic systolic and diastolic CHF last EF measured in July 2023 was EF of 25 to 30% with grade 2 diastolic dysfunction, atrial fibrillation, pacemaker placement, diabetes mellitus type 2, chronic anemia who was recently admitted for sepsis in the setting of pneumonia also had respiratory failure with CHF eventually discharged to rehab yesterday when patient was found to be increasingly tachypneic and was brought back to the ER.  ED Course: In the ER patient had CT angiogram of the chest and CT abdomen pelvis.  CT angio of the chest shows features concerning for aspiration and also possible lung abscess CT abdomen pelvis shows features concerning for enteritis.  Labs show elevated lactic acid which improved with fluids.  Also shows elevated sodium levels.  Patient started on empiric antibiotics admitted for further work-up.  Review of Systems: As per HPI, rest all negative.   Past Medical History:  Diagnosis Date   CHF (congestive heart failure) (HCC)    Diabetes mellitus without complication (HCC)    Essential hypertension    Mixed hyperlipidemia    Permanent atrial fibrillation ([place])    a. s/p catheter ablation ~ 12 yrs ago @ [facility] per family->unsuccessful; b. CHA2DS2VASc = 5-->chronic coumadin.    Past Surgical History:  Procedure Laterality Date   APPENDECTOMY     INSERT / REPLACE / REMOVE PACEMAKER     PACEMAKER IMPLANT N/A 03/25/2018   Procedure: PACEMAKER IMPLANT;  Surgeon: Thompson Grayer, MD;  Location: [facility];  Service: Cardiovascular;  Laterality: N/A;     reports that he has quit smoking. His smoking use included cigarettes.  He has a 52.50 pack-year smoking history. He has never used smokeless tobacco. He reports that he does not currently use alcohol. He reports that he does not use drugs.  Allergies  Allergen Reactions   Methyldopa Nausea Only and Palpitations    Other reaction(s): Unknown Other reaction(s): NAUSEA 'palpitation" Other reaction(s): NAUSEA Other reaction(s): NAUSEA     Family History  Problem Relation Age of Onset   CVA Mother    Bladder Cancer Father     Prior to Admission medications   Medication Sig Start Date End Date Taking? Authorizing Provider  amLODipine (NORVASC) 2.5 MG tablet Take 2.5 mg by mouth daily. 05/02/21  Yes [provider]  donepezil (ARICEPT) 10 MG tablet Take 10 mg by mouth at bedtime. 03/14/21  Yes [provider]  doxazosin (CARDURA) 4 MG tablet Take 4 mg by mouth Nightly. 12/31/16  Yes [provider]  fluconazole (DIFLUCAN) 100 MG tablet Take 1 tablet (100 mg total) by mouth daily for 6 doses. 09/01/21 09/07/21 Yes Fritzi Mandes, MD  furosemide (LASIX) 40 MG tablet Take 1 tablet (40 mg total) by mouth daily. 09/01/21  Yes Fritzi Mandes, MD  glimepiride (AMARYL) 2 MG tablet Take 4 mg by mouth daily. 08/14/16  Yes [provider]  guaiFENesin-dextromethorphan (ROBITUSSIN DM) 100-10 MG/5ML syrup Take 5 mLs by mouth every 4 (four) hours as needed for cough. 05/21/21  Yes Annita Brod, MD  lisinopril (ZESTRIL) 20 MG tablet Take 20 mg by mouth daily. 05/12/21  Yes [provider]  memantine (NAMENDA) 10 MG tablet  Take 10 mg by mouth daily. 05/18/21  Yes [provider]  metFORMIN (GLUCOPHAGE) 1000 MG tablet Take 1,000 mg by mouth 2 (two) times daily.   Yes [provider]  Multiple Vitamin (MULTIVITAMIN WITH MINERALS) TABS tablet Take 1 tablet by mouth daily. 09/01/21  Yes Fritzi Mandes, MD  polyethylene glycol (MIRALAX / GLYCOLAX) 17 g packet Take 17 g by mouth daily. 09/02/21  Yes Fritzi Mandes, MD  Potassium 99 MG  TABS Take 1.5 tablets by mouth daily.   Yes [provider]  QUEtiapine (SEROQUEL) 25 MG tablet Take 1 tablet (25 mg total) by mouth at bedtime. 09/01/21  Yes Fritzi Mandes, MD  simvastatin (ZOCOR) 20 MG tablet Take 20 mg by mouth Nightly. 12/31/16  Yes [provider]  vitamin B-12 (CYANOCOBALAMIN) 1000 MCG tablet Take 1,000 mcg by mouth daily.   Yes [provider]  warfarin (COUMADIN) 2.5 MG tablet Take 0.5-1 tablets (1.25-2.5 mg total) by mouth as directed. Take 1/2 tablet (1.25 mg) daily until 8/5. ON 8/6 take 1/2 tablet (1.25 mg) on Tues,Thur,Sunday. Take 1 tablet (2.5 mg) on Monday, Wednesday, Friday, Saturday. 09/01/21  Yes Fritzi Mandes, MD  Ensure Max Protein (ENSURE MAX PROTEIN) LIQD Take 330 mLs (11 oz total) by mouth 3 (three) times daily. 09/01/21   Fritzi Mandes, MD    Physical Exam: Constitutional: Moderately built and nourished. Vitals:   09/02/21 1415 09/02/21 1430 09/02/21 1500 09/02/21 1941  BP: (!) 130/52 (!) 127/53 (!) 140/56 (!) 125/102  Pulse: 73 60 60 65  Resp: (!) 26 (!) 29 (!) 28 (!) 22  Temp:    97.7 F (36.5 C)  TempSrc:    Oral  SpO2: 99% 99% 97% 97%  Weight:      Height:       Eyes: Anicteric no pallor. ENMT: No discharge from the ears eyes nose and mouth. Neck: No mass felt.  No neck rigidity. Respiratory: No rhonchi or crepitations. Cardiovascular: S1-S2 heard. Abdomen: Soft nontender bowel sound present. Musculoskeletal: No edema. Skin: No rash. Neurologic: Alert awake oriented to name moving all extremities. Psychiatric: Oriented to his name.   Labs on Admission: I have personally reviewed following labs and imaging studies  CBC: Recent Labs  Lab 08/28/21 0521 08/29/21 0513 09/02/21 1233  WBC 7.7 8.0 14.8*  HGB 8.2* 9.1* 8.1*  HCT 25.5* 27.0* 27.0*  MCV 93.4 91.2 101.5*  PLT 189 171 465   Basic Metabolic Panel: Recent Labs  Lab 08/27/21 0235 08/28/21 0521 08/29/21 0513 08/30/21 0326 09/02/21 1036  NA 137 139  143  --  151*  K 3.6 3.8 3.6  --  4.0  CL 106 106 110  --  120*  CO2 _0 --  28  GLUCOSE 93 212* 157*  --  297*  BUN 68* 70* 65*  --  36*  CREATININE 1.11 1.00 0.93  --  0.90  CALCIUM 7.9* 8.4* 9.0  --  8.8*  MG  --  2.4 2.4  --   --   PHOS  --   --  2.7 2.3*  --    GFR: Estimated Creatinine Clearance: 63.1 mL/min (by C-G formula based on SCr of 0.9 mg/dL). Liver Function Tests: No results for input(s): "AST", "ALT", "ALKPHOS", "BILITOT", "PROT", "ALBUMIN" in the last 168 hours. No results for input(s): "LIPASE", "AMYLASE" in the last 168 hours. No results for input(s): "AMMONIA" in the last 168 hours. Coagulation Profile: Recent Labs  Lab 08/29/21 0513 08/30/21 0326 08/31/21  0321 09/01/21 0509 09/02/21 1036  INR 2.4* 1.9* 2.6* 3.1* 3.2*   Cardiac Enzymes: No results for input(s): "CKTOTAL", "CKMB", "CKMBINDEX", "TROPONINI" in the last 168 hours. BNP (last 3 results) No results for input(s): "PROBNP" in the last 8760 hours. HbA1C: No results for input(s): "HGBA1C" in the last 72 hours. CBG: Recent Labs  Lab 08/31/21 1114 08/31/21 1540 08/31/21 2033 09/01/21 0731 09/01/21 1231  GLUCAP 236* 284* 130* 147* 288*   Lipid Profile: No results for input(s): "CHOL", "HDL", "LDLCALC", "TRIG", "CHOLHDL", "LDLDIRECT" in the last 72 hours. Thyroid Function Tests: No results for input(s): "TSH", "T4TOTAL", "FREET4", "T3FREE", "THYROIDAB" in the last 72 hours. Anemia Panel: No results for input(s): "VITAMINB12", "FOLATE", "FERRITIN", "TIBC", "IRON", "RETICCTPCT" in the last 72 hours. Urine analysis:    Component Value Date/Time   COLORURINE YELLOW (A) 08/22/2021 1929   APPEARANCEUR HAZY (A) 08/22/2021 1929   LABSPEC 1.016 08/22/2021 1929   PHURINE 5.0 08/22/2021 1929   GLUCOSEU 50 (A) 08/22/2021 1929   HGBUR SMALL (A) 08/22/2021 [place] NEGATIVE 08/22/2021 [place] NEGATIVE 08/22/2021 1929   PROTEINUR NEGATIVE 08/22/2021 1929   NITRITE NEGATIVE  08/22/2021 1929   LEUKOCYTESUR NEGATIVE 08/22/2021 1929   Sepsis Labs: _0 (procalcitonin:4,lacticidven:4) ) Recent Results (from the past 240 hour(s))  Culture, BAL-quantitative w Gram Stain     Status: Abnormal   Collection Time: 08/27/21 12:36 PM   Specimen: Bronchoalveolar Lavage; Respiratory  Result Value Ref Range Status   Specimen Description   Final    BRONCHIAL ALVEOLAR LAVAGE Performed at [facility], [address], [place], [place] [address]    Special Requests   Final    Immunocompromised Performed at [facility], [address] [place], [place] [address]    Gram Stain   Final    ABUNDANT WBC PRESENT, PREDOMINANTLY PMN FEW YEAST WITH PSEUDOHYPHAE Performed at [facility] [address] [place], [place] [address]    Culture (A)  Final    80,000 COLONIES/mL CANDIDA TROPICALIS 20,000 COLONIES/mL CANDIDA ALBICANS    Report Status 08/30/2021 FINAL  Final  SARS Coronavirus 2 by RT PCR (hospital order, performed in [facility] hospital lab) *cepheid single result test* Anterior Nasal Swab     Status: None   Collection Time: 09/02/21  2:41 PM   Specimen: Anterior Nasal Swab  Result Value Ref Range Status   SARS Coronavirus 2 by RT PCR NEGATIVE NEGATIVE Final    Comment: (NOTE) SARS-CoV-2 target nucleic acids are NOT DETECTED.  The SARS-CoV-2 RNA is generally detectable in upper and lower respiratory specimens during the acute phase of infection. The lowest concentration of SARS-CoV-2 viral copies this assay can detect is 250 copies / mL. A negative result does not preclude SARS-CoV-2 infection and should not be used as the sole basis for treatment or other patient management decisions.  A negative result may occur with improper specimen collection / handling, submission of specimen other than nasopharyngeal swab, presence of viral mutation(s) within the areas targeted by this assay, and inadequate number of viral copies (<250  copies / mL). A negative result must be combined with clinical observations, patient history, and epidemiological information.  Fact Sheet for Patients:   https://www.patel.info/  Fact Sheet for Healthcare Providers: https://hall.com/  This test is not yet approved or  cleared by the [place] FDA and has been authorized for detection and/or diagnosis of SARS-CoV-2 by FDA under an Emergency Use Authorization (EUA).  This EUA will remain in  effect (meaning this test can be used) for the duration of the COVID-19 declaration under Section 564(b)(1) of the Act, 21 U.S.C. section 360bbb-3(b)(1), unless the authorization is terminated or revoked sooner.  Performed at [facility], [address], [place], [place] [address]      Radiological Exams on Admission: CT Abdomen Pelvis W Contrast  Result Date: 09/02/2021 CLINICAL DATA:  Abdominal pain, recent discharge for sepsis/respiratory failure. EXAM: CT ABDOMEN AND PELVIS WITH CONTRAST TECHNIQUE: Multidetector CT imaging of the abdomen and pelvis was performed using the standard protocol following bolus administration of intravenous contrast. RADIATION DOSE REDUCTION: This exam was performed according to the departmental dose-optimization program which includes automated exposure control, adjustment of the mA and/or kV according to patient size and/or use of iterative reconstruction technique. CONTRAST:  159m OMNIPAQUE IOHEXOL 350 MG/ML SOLN COMPARISON:  CT August 22, 2021. FINDINGS: Despite efforts by the technologist and patient, motion artifact is present on today's exam and could not be eliminated. This reduces exam sensitivity and specificity. Lower chest: Moderate right and small left pleural effusions with adjacent atelectasis/consolidations. Nodular masslike consolidation in the right lower lobe measures 3.7 cm and is new from CT August 22, 2021 most likely reflecting an area of round  atelectasis. Cardiomegaly. Hepatobiliary: No suspicious hepatic lesion. Cholelithiasis with some wall thickening and mural calcifications in a distended gallbladder. No biliary ductal dilation. Pancreas: No pancreatic ductal dilation or evidence of acute inflammation. Spleen: No splenomegaly. Adrenals/Urinary Tract: Similar thickening of the left-greater-than-right adrenal glands without discrete nodularity favor hyperplasia. Bilateral fluid density renal lesions are consistent with cysts measuring up to 7.6 cm and requiring no independent follow-up. No hydronephrosis. Kidneys demonstrate symmetric enhancement and excretion of contrast material. Urinary bladder is unremarkable for degree of distension. Stomach/Bowel: No radiopaque enteric contrast material was administered. Stomach is nondistended limiting evaluation. No pathologic dilation of small or large bowel. Stratified wall thickening involving loops of small bowel in the left hemiabdomen for instance on image 50/2 with adjacent inflammatory stranding. Colonic diverticulosis without findings of acute diverticulitis. Vascular/Lymphatic: Aortic and branch vessel atherosclerosis. Calcifications of the celiac and SMA ostia with mild-to-moderate stenosis but well opacified distal to this area of stenosis. The portal, splenic and superior mesenteric veins are patent. No pathologically enlarged abdominal or pelvic lymph nodes. Reproductive: Heterogeneous enhancement an enlarged prostate with median lobe hypertrophy. Other: Similar small volume of abdominopelvic ascites with mesenteric stranding. No walled off fluid collections. No pneumoperitoneum. No portal venous gas. Musculoskeletal: Multilevel degenerative changes spine intussusception related to that mass. IMPRESSION: 1. Stratified mural thickening involving loops of small bowel wall in the left hemiabdomen with adjacent inflammatory stranding, most consistent with enteritis including infectious/inflammatory  and ischemic etiologies. No evidence of bowel obstruction or perforation. 2. Similar small volume abdominopelvic free fluid with mesenteric stranding, no pneumatosis, pneumoperitoneum, or walled off fluid collections. 3. Cholelithiasis in a distended gallbladder with some wall thickening equivocal CT findings for acute cholecystitis. Consider further evaluation with nuclear medicine HIDA scan if clinically indicated. 4. Cardiomegaly with moderate right and small left pleural effusions as well as adjacent atelectasis versus infiltrate. 5. Colonic diverticulosis without findings of acute diverticulitis. 6. Heterogeneous enhancement of an enlarged prostate gland. 7.  Aortic Atherosclerosis (ICD10-I70.0). Electronically Signed   By: JDahlia BailiffM.D.   On: 09/02/2021 16:56   CT Angio Chest PE W/Cm &/Or Wo Cm  Result Date: 09/02/2021 CLINICAL DATA:  Respiratory failure. EXAM: CT ANGIOGRAPHY CHEST WITH CONTRAST TECHNIQUE: Multidetector CT imaging of the chest was  performed using the standard protocol during bolus administration of intravenous contrast. Multiplanar CT image reconstructions and MIPs were obtained to evaluate the vascular anatomy. RADIATION DOSE REDUCTION: This exam was performed according to the departmental dose-optimization program which includes automated exposure control, adjustment of the mA and/or kV according to patient size and/or use of iterative reconstruction technique. CONTRAST:  153m OMNIPAQUE IOHEXOL 350 MG/ML SOLN COMPARISON:  None Available. FINDINGS: Cardiovascular: Satisfactory opacification of the pulmonary arteries to the segmental level. No evidence of pulmonary embolism. Normal heart size. No pericardial effusion. Coronary artery calcifications are noted. Atherosclerosis of thoracic aorta is noted without aneurysm formation. Mediastinum/Nodes: No enlarged mediastinal, hilar, or axillary lymph nodes. Thyroid gland and esophagus demonstrate no significant findings. Possible  aspirated material is noted in the trachea. Lungs/Pleura: Lung apices are not included in field-of-view. No definite pneumothorax is noted. Moderate bilateral pleural effusions are noted with adjacent atelectasis of both lower lobes. 3.9 x 2.3 cm cavitary abnormality is noted in left lower lobe concerning for pulmonary abscess or cavitating pneumonia. Upper Abdomen: Cholelithiasis. Musculoskeletal: No chest wall abnormality. No acute or significant osseous findings. Review of the MIP images confirms the above findings. IMPRESSION: No definite evidence of pulmonary embolus. Moderate bilateral pleural effusions are noted with adjacent atelectasis of both lower lobes. Possible aspirated food debris or other material in the trachea. 3.9 x 2.3 cm cavitary abnormality is noted in left lower lobe concerning for pulmonary abscess or cavitating pneumonia. Coronary artery calcifications are noted. Cholelithiasis. Aortic Atherosclerosis (ICD10-I70.0). Electronically Signed   By: JMarijo ConceptionM.D.   On: 09/02/2021 16:38   DG Chest 2 View  Result Date: 09/02/2021 CLINICAL DATA:  Shortness of breath. EXAM: CHEST - 2 VIEW COMPARISON:  08/27/2021 FINDINGS: There is a left chest wall pacer device with lead in the right ventricle. Stable cardiomediastinal contours. Increased volume of bilateral pleural effusions, right greater than left. Mild interstitial edema. Atelectasis noted within both lung bases. IMPRESSION: 1. Interval increase in CHF pattern with progressive bilateral pleural effusions and interstitial edema. Electronically Signed   By: TKerby MoorsM.D.   On: 09/02/2021 11:18    EKG: Independently reviewed.  Paced rhythm.  Assessment/Plan Principal Problem:   Aspiration pneumonia (HCC) Active Problems:   Paroxysmal atrial fibrillation (HCC)   Chronic diastolic (congestive) heart failure (HCC)   DM (diabetes mellitus) ([place]   Lung abscess (HCC)   Hypernatremia    Aspiration pneumonia with possible  developing lung abscess -patient was placed on clindamycin which we will continue.  We will keep patient n.p.o. for now until we get swallow evaluation.  Patient did have elevated lactic acidosis which improved with fluids.  Follow cultures. Chronic systolic and diastolic CHF last EF measured was in July 2023 showed EF of 25 to 30% with grade 2 diastolic dysfunction.  Appears compensated at this time.  Closely monitor respiratory status. Hypernatremia -could be from dehydration.  Patient did receive fluids in the ER.  Follow metabolic panel closely. Hypertension we will keep patient n.p.o. and IV hydralazine until patient can pass swallow evaluation. Diabetes mellitus type 2 we will keep patient sliding scale coverage.  Last hemoglobin A1c was around 7.2. Atrial fibrillation presently n.p.o. so we will keep patient on Lovenox coverage until patient can take Coumadin orally. History of pacemaker placement. Chronic anemia. Possible early dementia. Elevated troponins.  Will trend cardiac markers.  Patient is not complaining of any chest pain. Enteritis seen in the CAT scan.  If patient does have any diarrhea  we will have to get stool studies.  During recent admission patient had a bowel obstruction.  Since patient has aspiration pneumonia with possible lung abscess will need close monitoring and further management and inpatient status.   DVT prophylaxis: Lovenox. Code Status: DNR. Family Communication: Patient's daughter. Disposition Plan: To be determined. Consults called: Speech therapy. Admission status: Inpatient.   Rise Patience MD [facility] Pager [phone].  If 7PM-7AM, please contact night-coverage [email or web address] Password [facility]  09/02/2021, 7:51 PM

## 2021-09-02 NOTE — ED Notes (Signed)
Troponin 125. Critical reported to Dr. Larinda Buttery.

## 2021-09-02 NOTE — ED Notes (Signed)
Pt cleaned of urine and feces. Clean brief placed and primafit applied.

## 2021-09-02 NOTE — Consult Note (Signed)
ANTICOAGULATION CONSULT NOTE  Pharmacy Consult for Enoxaparin Indication: atrial fibrillation  Allergies  Allergen Reactions   Methyldopa Nausea Only and Palpitations    Other reaction(s): Unknown Other reaction(s): NAUSEA 'palpitation" Other reaction(s): NAUSEA Other reaction(s): NAUSEA     Patient Measurements: Height: 5\' 10"  (177.8 cm) Weight: 80.9 kg (178 lb 5.6 oz) IBW/kg (Calculated) : 73 Heparin Dosing Weight: 80.9 kg  Vital Signs: Temp: 98.1 F (36.7 C) (08/01 2043) Temp Source: Oral (08/01 2043) BP: 139/59 (08/01 2043) Pulse Rate: 91 (08/01 2043)  Labs: Recent Labs    08/31/21 0321 09/01/21 0509 09/02/21 1036 09/02/21 1233 09/02/21 2105  HGB  --   --   --  8.1*  --   HCT  --   --   --  27.0*  --   PLT  --   --   --  170  --   LABPROT 27.2* 32.0* 32.7*  --   --   INR 2.6* 3.1* 3.2*  --   --   CREATININE  --   --  0.90  --  0.89  TROPONINIHS  --   --  125*  --  124*    Estimated Creatinine Clearance: 63.8 mL/min (by C-G formula based on SCr of 0.89 mg/dL).   Medical History: Past Medical History:  Diagnosis Date   CHF (congestive heart failure) (HCC)    Diabetes mellitus without complication (HCC)    Essential hypertension    Mixed hyperlipidemia    Permanent atrial fibrillation (HCC)    a. s/p catheter ablation ~ 12 yrs ago @ [facility] per family->unsuccessful; b. CHA2DS2VASc = 5-->chronic coumadin.    Medications:  Warfarin: 1.25mg  TuThSu       2.5mg   MWFSa   Assessment: Pharmacy has been consulted to monitor and dose therapeutic enoxaparin in 86yo male with history of atrial fibrillation. Patient takes Warfarin at home(dosing listed above) with his last dose being taken on 7/31(2.5mg  dose).   Per admitting physician, hold off on enoxaparin therapy until INR<2.  Goal of Therapy:  Anti-Xa level 0.6-1 units/ml 4hrs after LMWH dose given(once at steady state) Monitor platelets by anticoagulation protocol: Yes   Plan:  INR still elevated,  therefore will continue to hold enoxaparin therapy. Will recheck INR with AM labs. CBC daily.  Davius Goudeau A Jamisha Hoeschen 09/02/2021,10:19 PM

## 2021-09-02 NOTE — ED Provider Notes (Signed)
[facility] Provider Note    Event Date/Time   First MD Initiated Contact with Patient 09/02/21 1505     (approximate)   History   Chief Complaint Shortness of Breath   HPI  Andrew [name] is a 86 y.o. male with past medical history of hypertension, hyperlipidemia, diabetes, CHF, atrial fibrillation on Coumadin, and dementia who presents to the ED for shortness of breath.  Patient was discharged from the hospital yesterday to rehab facility following 10-day admission for sepsis secondary to pneumonia, respiratory failure, CHF exacerbation, and SBO.  Wife and daughter at bedside state they were concerned patient's breathing did not improve at the time of discharge.  They feel that he has become increasingly short of breath since arriving to rehab facility, only able to speak in short sentences with increasing respiratory rate.  They state he seems like he has gurgling respirations at times and is unable to clear his throat.  He has not had any fevers and patient currently denies any chest pain, abdominal pain, nausea, or vomiting.  Daughter is a Engineer, civil (consulting) and reports concerned that patient has been aspirating.  They have not noticed any pain or swelling in his legs.     Physical Exam   Triage Vital Signs: ED Triage Vitals [09/02/21 1032]  Enc Vitals Group     BP (!) 132/50     Pulse Rate 64     Resp 20     Temp 98.4 F (36.9 C)     Temp Source Oral     SpO2 95 %     Weight 178 lb 5.6 oz (80.9 kg)     Height 5\' 10"  (1.778 m)     Head Circumference      Peak Flow      Pain Score 0     Pain Loc      Pain Edu?      Excl. in GC?     Most recent vital signs: Vitals:   09/02/21 1430 09/02/21 1500  BP: (!) 127/53 (!) 140/56  Pulse: 60 60  Resp: (!) 29 (!) 28  Temp:    SpO2: 99% 97%    Constitutional: Awake and alert, answering questions appropriately. Eyes: Conjunctivae are normal. Head: Atraumatic. Nose: No congestion/rhinnorhea. Mouth/Throat:  Mucous membranes are moist.  Cardiovascular: Normal rate, regular rhythm. Grossly normal heart sounds.  2+ radial pulses bilaterally. Respiratory: Mildly tachypneic with increased respiratory effort, no retractions noted.  Lungs with crackles to bilateral bases. Gastrointestinal: Soft and nontender. No distention. Musculoskeletal: No lower extremity tenderness nor edema.  Neurologic:  Normal speech and language. No gross focal neurologic deficits are appreciated.    ED Results / Procedures / Treatments   Labs (all labs ordered are listed, but only abnormal results are displayed) Labs Reviewed  BASIC METABOLIC PANEL - Abnormal; Notable for the following components:      Result Value   Sodium 151 (*)    Chloride 120 (*)    Glucose, Bld 297 (*)    BUN 36 (*)    Calcium 8.8 (*)    Anion gap 3 (*)    All other components within normal limits  PROTIME-INR - Abnormal; Notable for the following components:   Prothrombin Time 32.7 (*)    INR 3.2 (*)    All other components within normal limits  BRAIN NATRIURETIC PEPTIDE - Abnormal; Notable for the following components:   B Natriuretic Peptide 413.1 (*)    All other components within  normal limits  LACTIC ACID, PLASMA - Abnormal; Notable for the following components:   Lactic Acid, Venous 2.6 (*)    All other components within normal limits  CBC - Abnormal; Notable for the following components:   WBC 14.8 (*)    RBC 2.66 (*)    Hemoglobin 8.1 (*)    HCT 27.0 (*)    MCV 101.5 (*)    RDW 16.2 (*)    All other components within normal limits  BLOOD GAS, VENOUS - Abnormal; Notable for the following components:   pO2, Ven <31 (*)    All other components within normal limits  TROPONIN I (HIGH SENSITIVITY) - Abnormal; Notable for the following components:   Troponin I (High Sensitivity) 125 (*)    All other components within normal limits  SARS CORONAVIRUS 2 BY RT PCR  CULTURE, BLOOD (ROUTINE X 2)  CULTURE, BLOOD (ROUTINE X 2)   PROCALCITONIN  LACTIC ACID, PLASMA     EKG  ED ECG REPORT I, Chesley Noon, the attending physician, personally viewed and interpreted this ECG.   Date: 09/02/2021  EKG Time: 10:42  Rate: 63  Rhythm: normal EKG, normal sinus rhythm, unchanged from previous tracings, Ventricular paced rhythm  Axis: LAD  Intervals:nonspecific intraventricular conduction delay  ST&T Change: None  RADIOLOGY Chest x-ray reviewed and interpreted by me with bilateral infiltrates concerning for pulmonary edema versus multifocal pneumonia.  PROCEDURES:  Critical Care performed: Yes, see critical care procedure note(s)  .Critical Care  Performed by: Chesley Noon, MD Authorized by: Chesley Noon, MD   Critical care provider statement:    Critical care time (minutes):  30   Critical care time was exclusive of:  Separately billable procedures and treating other patients and teaching time   Critical care was necessary to treat or prevent imminent or life-threatening deterioration of the following conditions:  Sepsis   Critical care was time spent personally by me on the following activities:  Development of treatment plan with patient or surrogate, discussions with consultants, evaluation of patient's response to treatment, examination of patient, ordering and review of laboratory studies, ordering and review of radiographic studies, ordering and performing treatments and interventions, pulse oximetry, re-evaluation of patient's condition and review of old charts   I assumed direction of critical care for this patient from another provider in my specialty: no     Care discussed with: admitting provider      MEDICATIONS ORDERED IN ED: Medications  vancomycin (VANCOREADY) IVPB 1750 mg/350 mL (1,750 mg Intravenous New Bag/Given 09/02/21 1626)  lactated ringers bolus 500 mL (has no administration in time range)  ceFEPIme (MAXIPIME) 2 g in sodium chloride 0.9 % 100 mL IVPB (0 g Intravenous Stopped  09/02/21 1647)  iohexol (OMNIPAQUE) 350 MG/ML injection 100 mL (100 mLs Intravenous Contrast Given 09/02/21 1605)  lactated ringers bolus 500 mL (0 mLs Intravenous Stopped 09/02/21 1705)     IMPRESSION / MDM / ASSESSMENT AND PLAN / ED COURSE  I reviewed the triage vital signs and the nursing notes.                              86 y.o. male with past medical history of hypertension, hyperlipidemia, diabetes, CHF, atrial fibrillation on Coumadin, and dementia who presents to the ED for increasing difficulty breathing and concern for aspiration following recent 10-day admission for sepsis secondary to pneumonia.  Patient's presentation is most consistent with acute presentation with potential threat  to life or bodily function.  Differential diagnosis includes, but is not limited to, sepsis, aspiration pneumonia, hospital-acquired pneumonia, dehydration, electrolyte abnormality, CHF exacerbation, COPD, PE, recurrent bowel obstruction.  Patient chronically ill-appearing but in no acute distress, mildly tachypneic but not in any respiratory distress, crackles noted on lung exam bilaterally.  He was initially placed on 2 L nasal cannula, however this has now been turned off with O2 sats remaining around 93% on room air.  EKG shows ventricular paced rhythm, chest x-ray concerning for multifocal infiltrates versus pulmonary edema.  He does not appear clinically fluid overloaded, BNP only mildly elevated.  I am concerned that he has continued to aspirate with resulting worsening pneumonia, will start on broad-spectrum antibiotics.  Lower suspicion for CHF at this time but we will further assess with CTA of his chest.  Labs concerning for leukocytosis, acute on chronic anemia, lactic acidosis, and hyponatremia.  Findings overall concerning for sepsis, no acute bleeding noted at this time.  We will hold off on IV fluid bolus given concern for component of CHF contributing to his respiratory symptoms.  Anticipate  admission following CT results.  CTA of chest is negative for PE, does show signs of aspiration with food material in the trachea, evidence of pneumonia with bilateral effusions, as well as fluid collection concerning for abscess.  CT of abdomen/pelvis shows enteritis but no focal fluid collection or signs of obstruction.  Case discussed with Dr. Welton Flakes of pulmonary, who recommends against bronchoscopy for food retrieval, recommends medical treatment for abscess.  We will add clindamycin for anaerobic coverage, case discussed with hospitalist for admission.  Patient to be hydrated with IV fluids given no signs of CHF at this time.      FINAL CLINICAL IMPRESSION(S) / ED DIAGNOSES   Final diagnoses:  Aspiration pneumonia of both lungs, unspecified aspiration pneumonia type, unspecified part of lung (HCC)  Abscess of left lung with pneumonia, unspecified part of lung (HCC)  Sepsis without acute organ dysfunction, due to unspecified organism [facility])     Rx / DC Orders   ED Discharge Orders     None        Note:  This document was prepared using Dragon voice recognition software and may include unintentional dictation errors.   Chesley Noon, MD 09/02/21 [phone]

## 2021-09-02 NOTE — Code Documentation (Signed)
PHARMACY -  BRIEF ANTIBIOTIC NOTE   Pharmacy has received consult(s) for Cefepime + Vancomycin from an ED provider.  The patient's profile has been reviewed for ht/wt/allergies/indication/available labs.    One time order(s) placed for Vancomycin 1.75g IV x1. Cefepime 2g IV x1  Further antibiotics/pharmacy consults should be ordered by admitting physician if indicated.                       Thank you, Selinda Eon 09/02/2021  3:32 PM

## 2021-09-02 NOTE — ED Triage Notes (Signed)
Brought by ems from [facility].  Dishcarged from  here yesterday after sepssi/resp failure.  Last night on RA--they put him on 3 liters.  Restless and was desatting.  Cxr done this am and has pleural effusion.   Awake and oriented for emws. 130/49 sat 98 on 3 liters.  Etco 23.  Resp 28.  Paced at 60.

## 2021-09-02 NOTE — Sepsis Progress Note (Signed)
Notified bedside nurse of need to draw repeat lactic acid. 

## 2021-09-02 NOTE — ED Notes (Signed)
Pt brief changed. Large BM.

## 2021-09-03 ENCOUNTER — Inpatient Hospital Stay: Payer: Medicare HMO

## 2021-09-03 DIAGNOSIS — E119 Type 2 diabetes mellitus without complications: Secondary | ICD-10-CM | POA: Diagnosis not present

## 2021-09-03 DIAGNOSIS — E870 Hyperosmolality and hypernatremia: Secondary | ICD-10-CM

## 2021-09-03 DIAGNOSIS — I5032 Chronic diastolic (congestive) heart failure: Secondary | ICD-10-CM

## 2021-09-03 DIAGNOSIS — Z7189 Other specified counseling: Secondary | ICD-10-CM

## 2021-09-03 DIAGNOSIS — Z515 Encounter for palliative care: Secondary | ICD-10-CM | POA: Diagnosis not present

## 2021-09-03 DIAGNOSIS — J851 Abscess of lung with pneumonia: Secondary | ICD-10-CM | POA: Diagnosis not present

## 2021-09-03 DIAGNOSIS — J690 Pneumonitis due to inhalation of food and vomit: Secondary | ICD-10-CM | POA: Diagnosis not present

## 2021-09-03 LAB — BASIC METABOLIC PANEL
Anion gap: 4 — ABNORMAL LOW (ref 5–15)
BUN: 30 mg/dL — ABNORMAL HIGH (ref 8–23)
CO2: 28 mmol/L (ref 22–32)
Calcium: 8.7 mg/dL — ABNORMAL LOW (ref 8.9–10.3)
Chloride: 122 mmol/L — ABNORMAL HIGH (ref 98–111)
Creatinine, Ser: 0.79 mg/dL (ref 0.61–1.24)
GFR, Estimated: >60 mL/min (ref >60)
Glucose, Bld: 128 mg/dL — ABNORMAL HIGH (ref 70–99)
Potassium: 3.7 mmol/L (ref 3.5–5.1)
Sodium: 154 mmol/L — ABNORMAL HIGH (ref 135–145)

## 2021-09-03 LAB — CBC
HCT: 26.7 % — ABNORMAL LOW (ref 39.0–52.0)
Hemoglobin: 8.2 g/dL — ABNORMAL LOW (ref 13.0–17.0)
MCH: 30.8 pg (ref 26.0–34.0)
MCHC: 30.7 g/dL (ref 30.0–36.0)
MCV: 100.4 fL — ABNORMAL HIGH (ref 80.0–100.0)
Platelets: 148 10*3/uL — ABNORMAL LOW (ref 150–400)
RBC: 2.66 MIL/uL — ABNORMAL LOW (ref 4.22–5.81)
RDW: 15.9 % — ABNORMAL HIGH (ref 11.5–15.5)
WBC: 8.1 10*3/uL (ref 4.0–10.5)
nRBC: 0 % (ref 0.0–0.2)

## 2021-09-03 LAB — GLUCOSE, CAPILLARY
Glucose-Capillary: 88 mg/dL (ref 70–99)
Glucose-Capillary: 146 mg/dL — ABNORMAL HIGH (ref 70–99)
Glucose-Capillary: 79 mg/dL (ref 70–99)
Glucose-Capillary: 82 mg/dL (ref 70–99)
Glucose-Capillary: 114 mg/dL — ABNORMAL HIGH (ref 70–99)
Glucose-Capillary: 178 mg/dL — ABNORMAL HIGH (ref 70–99)
Glucose-Capillary: 220 mg/dL — ABNORMAL HIGH (ref 70–99)

## 2021-09-03 LAB — BLOOD GAS, ARTERIAL
Acid-base deficit: 4.2 mmol/L — ABNORMAL HIGH (ref 0.0–2.0)
Bicarbonate: 23.7 mmol/L (ref 20.0–28.0)
Delivery systems: POSITIVE
Expiratory PAP: 6 cmH2O
FIO2: 40 %
Inspiratory PAP: 14 cmH2O
O2 Saturation: 98.2 %
Patient temperature: 37
RATE: 10 resp/min
pCO2 arterial: 54 mmHg — ABNORMAL HIGH (ref 32–48)
pH, Arterial: 7.25 — ABNORMAL LOW (ref 7.35–7.45)
pO2, Arterial: 92 mmHg (ref 83–108)

## 2021-09-03 LAB — PROTIME-INR
INR: 4 — ABNORMAL HIGH (ref 0.8–1.2)
Prothrombin Time: 38.5 seconds — ABNORMAL HIGH (ref 11.4–15.2)

## 2021-09-03 MED ORDER — GLYCOPYRROLATE 1 MG PO TABS: 1.0000 mg | ORAL_TABLET | ORAL | Status: DC | PRN

## 2021-09-03 MED ORDER — ACETAMINOPHEN 325 MG PO TABS: 650.0000 mg | ORAL_TABLET | Freq: Four times a day (QID) | ORAL | Status: DC | PRN

## 2021-09-03 MED ORDER — POLYVINYL ALCOHOL 1.4 % OP SOLN: 1.0000 [drp] | Freq: Four times a day (QID) | OPHTHALMIC | Status: DC | PRN

## 2021-09-03 MED ORDER — ACETAMINOPHEN 650 MG RE SUPP: 650.0000 mg | Freq: Four times a day (QID) | RECTAL | Status: DC | PRN

## 2021-09-03 MED ORDER — GLYCOPYRROLATE 0.2 MG/ML IJ SOLN: 0.2000 mg | INTRAMUSCULAR | Status: DC | PRN

## 2021-09-03 MED ORDER — FUROSEMIDE 40 MG PO TABS
40.0000 mg | ORAL_TABLET | Freq: Every day | ORAL | Status: DC
Start: 2021-09-03 — End: 2021-09-03

## 2021-09-03 MED ORDER — GLYCOPYRROLATE 0.2 MG/ML IJ SOLN
0.2000 mg | INTRAMUSCULAR | Status: DC | PRN
  Administered 2021-09-03: 0.2 mg via INTRAVENOUS
  Filled 2021-09-03 (×2): qty 1

## 2021-09-03 MED ORDER — LORAZEPAM 2 MG/ML IJ SOLN
1.0000 mg | INTRAMUSCULAR | Status: DC | PRN
Start: 2021-09-03 — End: 2021-09-04

## 2021-09-03 MED ORDER — DEXTROSE 50 % IV SOLN: 12.5000 g | Freq: Once | INTRAVENOUS | Status: DC | PRN

## 2021-09-03 MED ORDER — MORPHINE SULFATE (PF) 2 MG/ML IV SOLN
1.0000 mg | INTRAVENOUS | Status: DC | PRN
  Administered 2021-09-03: 1 mg via INTRAVENOUS
  Filled 2021-09-03: qty 1

## 2021-09-03 MED ORDER — LISINOPRIL 10 MG PO TABS: 10.0000 mg | ORAL_TABLET | Freq: Every day | ORAL | Status: DC

## 2021-09-03 MED ORDER — LORAZEPAM 0.5 MG PO TABS: 0.5000 mg | ORAL_TABLET | Freq: Three times a day (TID) | ORAL | 0 refills | Status: AC | PRN

## 2021-09-03 MED ORDER — DEXTROSE 10 % IV SOLN: INTRAVENOUS | Status: DC

## 2021-09-03 MED ORDER — MORPHINE SULFATE (CONCENTRATE) 10 MG /0.5 ML PO SOLN: 5.0000 mg | ORAL | 0 refills | Status: AC | PRN

## 2021-09-03 MED ORDER — SODIUM CHLORIDE 0.9 % IV SOLN
3.0000 g | Freq: Four times a day (QID) | INTRAVENOUS | Status: DC
  Filled 2021-09-03 (×2): qty 8

## 2021-09-03 NOTE — IPAL (Signed)
  Interdisciplinary Goals of Care Family Meeting   Date carried out: 09/03/2021  Location of the meeting: Phone conference  Member's involved: Physician and Family Member or next of kin  Durable Power of Attorney or acting medical decision maker: Wife and daughter  Discussion: We discussed goals of care for Andrew [name]  Code status: Full DNR  Disposition: Home with Hospice  Time spent for the meeting: 35 minutes    Delfino Lovett, MD  09/03/2021, 11:33 AM

## 2021-09-03 NOTE — Evaluation (Signed)
Objective Swallowing Evaluation: Type of Study: MBS-Modified Barium Swallow Study (dysphagia tx post MBS w/ family, pt)   Patient Details  Name: Andrew [name] MRN: [id number] Date of Birth: 01/13/1936  Today's Date: 09/03/2021 Time: SLP Start Time (ACUTE ONLY): 1000 -SLP Stop Time (ACUTE ONLY): 1200  SLP Time Calculation (min) (ACUTE ONLY): 120 min   Past Medical History:  Past Medical History:  Diagnosis Date   CHF (congestive heart failure) (HCC)    Diabetes mellitus without complication (HCC)    Essential hypertension    Mixed hyperlipidemia    Permanent atrial fibrillation (HCC)    a. s/p catheter ablation ~ 12 yrs ago @ [facility] per family->unsuccessful; b. CHA2DS2VASc = 5-->chronic coumadin.   Past Surgical History:  Past Surgical History:  Procedure Laterality Date   APPENDECTOMY     INSERT / REPLACE / REMOVE PACEMAKER     PACEMAKER IMPLANT N/A 03/25/2018   Procedure: PACEMAKER IMPLANT;  Surgeon: Hillis Range, MD;  Location: [facility] INVASIVE CV LAB;  Service: Cardiovascular;  Laterality: N/A;   HPI: Pt is a 86 y.o. male with history of chronic systolic and diastolic CHF last EF measured in July 2023 was EF of 25 to 30% with grade 2 diastolic dysfunction, atrial fibrillation, pacemaker placement, diabetes mellitus type 2, chronic anemia who was recently admitted for sepsis in the setting of pneumonia also had respiratory failure with CHF eventually discharged to rehab yesterday when patient was found to be increasingly tachypneic and was brought back to the ER.     ED Course: In the ER patient had CT angiogram of the chest and CT abdomen pelvis.  CT angio of the chest shows features concerning for aspiration and also possible lung abscess CT abdomen pelvis shows features concerning for enteritis.  Labs show elevated lactic acid which improved with fluids.  Also shows elevated sodium levels.  Patient started on empiric antibiotics admitted for further work-up.  During previous (recent)  hospitalization,  chest x-ray revealed on 08/22/2021 Bilateral multifocal airspace opacities concerning for multifocal pneumonia. Chest x-ray on 08/27/2021 revealed similar enlarged cardiac silhouette and central vascular prominence, with bilateral interstitial and alveolar opacities. Pt intubated on 08/23/2021 thru 08/28/2021.  When pt discharged to SNF on 09/01/2021, he was being followed by ST services w/ dx of dysphagia and on a pureed diet with thin liquids and safe swallowing strategies/aspiration precautions.   Subjective: pt awake, verbal w/ 1 word responses d/t reduced pulmonary effort; fatigue and weak appearing    Recommendations for follow up therapy are one component of a multi-disciplinary discharge planning process, led by the attending physician.  Recommendations may be updated based on patient status, additional functional criteria and insurance authorization.  Assessment / Plan / Recommendation     09/03/2021    4:00 PM  Clinical Impressions  Clinical Impression Pt appears to present w/ Severe-Profound oropharyngeal phase Dysphagia w/ resulting Aspiration during the swallow of thin and Nectar liquids; larygneal Penetration of bolus residue, remaining in the pharynx post swallowing. Pt is at HIGH risk for Aspiration Pneumonia as well as challenges meeting his nutrition/hydration needs safely and adequately.  This was discussed at length w/ pt, Family; discussion of MBSS results and viewing of study afterwards. Education was completed on recomemndations for diet consistency to reduce discomfort during oral intake, aspiration precautions, strategies, and oral care. Handouts, supplies provided.  Discussion w/ MD/NSG/TOC as well as w/ Hospice Liasion after the study.  SLP Visit Diagnosis Dysphagia, oropharyngeal phase (R13.12)  Impact on safety  and function Severe aspiration risk;Risk for inadequate nutrition/hydration         09/03/2021    4:00 PM  Treatment Recommendations  Treatment  Recommendations Defer treatment plan to f/u with SLP        09/03/2021    4:00 PM  Prognosis  Prognosis for Safe Diet Advancement Guarded  Barriers to Reach Goals Time post onset;Severity of deficits       09/03/2021    4:00 PM  Diet Recommendations  SLP Diet Recommendations Dysphagia 1 (Puree) solids;Nectar thick liquid  Liquid Administration via Spoon  Compensations Minimize environmental distractions;Slow rate;Small sips/bites;Multiple dry swallows after each bite/sip;Lingual sweep for clearance of pocketing;Follow solids with liquid;Clear throat after each swallow;Hard cough after swallow;Effortful swallow  Postural Changes Remain semi-upright after after feeds/meals (Comment);Seated upright at 90 degrees         09/03/2021    4:00 PM  Other Recommendations  Recommended Consults --  Oral Care Recommendations Oral care QID;Oral care before and after PO;Staff/trained caregiver to provide oral care  Other Recommendations Order thickener from pharmacy;Prohibited food (jello, ice cream, thin soups);Remove water pitcher;Have oral suction available  Follow Up Recommendations --  Assistance recommended at discharge Frequent or constant Supervision/Assistance  Functional Status Assessment Patient has had a recent decline in their functional status and/or demonstrates limited ability to make significant improvements in function in a reasonable and predictable amount of time       09/03/2021  Frequency and Duration   Speech Therapy Frequency (ACUTE ONLY) TBD  Treatment Duration TBD         09/03/2021    4:00 PM  Oral Phase  Oral Phase Impaired -  Thin liquids: 2 tsps; 2 cup/straw sips Nectar liquids: 5 tsps; 4 cup/straw sips Puree: 1 tsp  Oral Phase - Comment slow A-P transfer, reduced coordination and control of boluses, premature spillage of liquids into the pharynx, oral residue, increased lingual movements in attempts to clear residue and use f/u, Dry swallows       09/03/2021     4:00 PM  Pharyngeal Phase  Pharyngeal Phase Impaired -  Thin liquids: 2 tsps; 2 cup/straw sips Nectar liquids: 5 tsps; 4 cup/straw sips Puree: 1 tsp  Pharyngeal Comment delayed pharyngeal swallow initiation resulting in (initially Silent) aspiration of thin and Nectar liquids, laryngeal penetration of pharyngeal residue b/t swallows, incomplete closure of airway, reduced laryngeal excursion and pharyngeal pressure during the swallow, reduced effort during f/u, Dry swallows        09/03/2021    4:00 PM  Cervical Esophageal Phase   Cervical Esophageal Phase Impaired -  Thin liquids: 2 tsps; 2 cup/straw sips Nectar liquids: 5 tsps; 4 cup/straw sips Puree: 1 tsp  Cervical Esophageal Comment appearance of increased air swallowed, bolus stasis and intermittent retrograde activity w/in the cervical esophagus           Jerilynn Som, MS, CCC-SLP Speech Language Pathologist Rehab Services; [facility] [phone] (ascom) Andrew [name] 09/03/2021, 4:48 PM

## 2021-09-03 NOTE — TOC Transition Note (Signed)
Transition of Care [facility]) - CM/SW Discharge Note   Patient Details  Name: Andrew [name] MRN: [id number] Date of Birth: [birth year]  [facility] CM/SW Contact:  Margarito Liner, LCSW Phone Number: 09/03/2021, 2:03 PM   Clinical Narrative:   Patient has orders to discharge to the [facility] today. [facility] representative has set up EMS transport for 6:00 pm. MD will come to unit to sign DNR. No further concerns. CSW signing off.  Final next level of care: Hospice Medical Facility Barriers to Discharge: Barriers Resolved   Patient Goals and CMS Choice     Choice offered to / list presented to : Spouse  Discharge Placement              Patient chooses bed at: Other - please specify in the comment section below: [facility]) Patient to be transferred to facility by: EMS   Patient and family notified of of transfer: 09/03/21  Discharge Plan and Services     Post Acute Care Choice: Hospice                               Social Determinants of Health (SDOH) Interventions     Readmission Risk Interventions     No data to display

## 2021-09-03 NOTE — Progress Notes (Signed)
AVS provided to EMS. IV remained in place.  Report called and given to [facility]. Pt to be transported by EMS to [facility].

## 2021-09-03 NOTE — Progress Notes (Signed)
  Chaplain On-Call responded to Spiritual Care Consult Order from Max Sane, MD.  The request was for support of the patient at the end of life.  Chaplain met the patient at bedside. Patient attempted to speak but was unable to do so. Chaplain provided spiritual and emotional support through silent presence, and also spoken words of comfort and reassurance.  Patient is scheduled for discharge at 1800 hours to Hospice facility.  Chaplain Pollyann Samples M.Div., [facility]

## 2021-09-03 NOTE — Discharge Summary (Signed)
Physician Discharge Summary   Patient: Andrew [name] MRN: [id number] DOB: [birth year]  Admit date:     09/02/2021  Discharge date: 09/03/21  Discharge Physician: Max Sane   PCP: Baxter Hire, MD   Recommendations at discharge:   Hospice home  Discharge Diagnoses: Principal Problem:   Aspiration pneumonia [facility]) Active Problems:   Paroxysmal atrial fibrillation (HCC)   Chronic diastolic (congestive) heart failure (HCC)   DM (diabetes mellitus) ([place])   Lung abscess ([place])   Hypernatremia   Goals of care, counseling/discussion   Hospice care patient  Hospital Course: Assessment and Plan: Andrew [name] is a 86 y.o. male with history of chronic systolic and diastolic CHF last EF measured in July 2023 was EF of 25 to 30% with grade 2 diastolic dysfunction, atrial fibrillation, pacemaker placement, diabetes mellitus type 2, chronic anemia who was recently admitted for sepsis in the setting of pneumonia also had respiratory failure with CHF eventually discharged to rehab yesterday when patient was found to be increasingly tachypneic and was brought back to the ER.  Hospice care: Patient being discharged to hospice home Aspiration pneumonia with possible developing lung abscess  Chronic systolic and diastolic CHF last EF measured was in July 2023 showed EF of 25 to 30% with grade 2 diastolic dysfunction Hypernatremia Hypertension  Diabetes mellitus type 2  Atrial fibrillation History of pacemaker placement. Chronic anemia. early dementia. Elevated troponins.  Due to demand ischemia, no MI Enteritis seen in the CAT scan        Disposition: Hospice care Diet recommendation:  Discharge Diet Orders (From admission, onward)     Start     Ordered   09/03/21 0000  Diet - low sodium heart healthy        09/03/21 1340           Dysphagia type 1 with Nectar thick Liquid DISCHARGE MEDICATION: Allergies as of 09/03/2021       Reactions   Methyldopa Nausea Only,  Palpitations   Other reaction(s): Unknown Other reaction(s): NAUSEA 'palpitation" Other reaction(s): NAUSEA Other reaction(s): NAUSEA        Medication List     STOP taking these medications    amLODipine 2.5 MG tablet Commonly known as: NORVASC   cyanocobalamin 1000 MCG tablet Commonly known as: VITAMIN B12   donepezil 10 MG tablet Commonly known as: ARICEPT   doxazosin 4 MG tablet Commonly known as: CARDURA   Ensure Max Protein Liqd   fluconazole 100 MG tablet Commonly known as: DIFLUCAN   furosemide 40 MG tablet Commonly known as: LASIX   glimepiride 2 MG tablet Commonly known as: AMARYL   guaiFENesin-dextromethorphan 100-10 MG/5ML syrup Commonly known as: ROBITUSSIN DM   lisinopril 20 MG tablet Commonly known as: ZESTRIL   memantine 10 MG tablet Commonly known as: NAMENDA   metFORMIN 1000 MG tablet Commonly known as: GLUCOPHAGE   multivitamin with minerals Tabs tablet   polyethylene glycol 17 g packet Commonly known as: MIRALAX / GLYCOLAX   Potassium 99 MG Tabs   QUEtiapine 25 MG tablet Commonly known as: SEROQUEL   simvastatin 20 MG tablet Commonly known as: ZOCOR   warfarin 2.5 MG tablet Commonly known as: COUMADIN       TAKE these medications    LORazepam 0.5 MG tablet Commonly known as: Ativan Take 1 tablet (0.5 mg total) by mouth every 8 (eight) hours as needed for anxiety.   morphine CONCENTRATE 10 mg / 0.5 ml concentrated solution Take 0.25 mLs (5  mg total) by mouth every 2 (two) hours as needed for severe pain.        Discharge Exam: Filed Weights   09/02/21 1032  Weight: 70.56 kg   86 year old male looking critically sick Lungs rhonchi throughout both lungs and rales at the bases, gurgling sound Cardiovascular regular rate and rhythm Abdomen soft, benign Neuro awake and alert, nonfocal  Condition at discharge: critical  The results of significant diagnostics from this hospitalization (including imaging,  microbiology, ancillary and laboratory) are listed below for reference.   Imaging Studies: CT Abdomen Pelvis W Contrast  Result Date: 09/02/2021 CLINICAL DATA:  Abdominal pain, recent discharge for sepsis/respiratory failure. EXAM: CT ABDOMEN AND PELVIS WITH CONTRAST TECHNIQUE: Multidetector CT imaging of the abdomen and pelvis was performed using the standard protocol following bolus administration of intravenous contrast. RADIATION DOSE REDUCTION: This exam was performed according to the departmental dose-optimization program which includes automated exposure control, adjustment of the mA and/or kV according to patient size and/or use of iterative reconstruction technique. CONTRAST:  128mL OMNIPAQUE IOHEXOL 350 MG/ML SOLN COMPARISON:  CT August 22, 2021. FINDINGS: Despite efforts by the technologist and patient, motion artifact is present on today's exam and could not be eliminated. This reduces exam sensitivity and specificity. Lower chest: Moderate right and small left pleural effusions with adjacent atelectasis/consolidations. Nodular masslike consolidation in the right lower lobe measures 3.7 cm and is new from CT August 22, 2021 most likely reflecting an area of round atelectasis. Cardiomegaly. Hepatobiliary: No suspicious hepatic lesion. Cholelithiasis with some wall thickening and mural calcifications in a distended gallbladder. No biliary ductal dilation. Pancreas: No pancreatic ductal dilation or evidence of acute inflammation. Spleen: No splenomegaly. Adrenals/Urinary Tract: Similar thickening of the left-greater-than-right adrenal glands without discrete nodularity favor hyperplasia. Bilateral fluid density renal lesions are consistent with cysts measuring up to 7.6 cm and requiring no independent follow-up. No hydronephrosis. Kidneys demonstrate symmetric enhancement and excretion of contrast material. Urinary bladder is unremarkable for degree of distension. Stomach/Bowel: No radiopaque enteric  contrast material was administered. Stomach is nondistended limiting evaluation. No pathologic dilation of small or large bowel. Stratified wall thickening involving loops of small bowel in the left hemiabdomen for instance on image 50/2 with adjacent inflammatory stranding. Colonic diverticulosis without findings of acute diverticulitis. Vascular/Lymphatic: Aortic and branch vessel atherosclerosis. Calcifications of the celiac and SMA ostia with mild-to-moderate stenosis but well opacified distal to this area of stenosis. The portal, splenic and superior mesenteric veins are patent. No pathologically enlarged abdominal or pelvic lymph nodes. Reproductive: Heterogeneous enhancement an enlarged prostate with median lobe hypertrophy. Other: Similar small volume of abdominopelvic ascites with mesenteric stranding. No walled off fluid collections. No pneumoperitoneum. No portal venous gas. Musculoskeletal: Multilevel degenerative changes spine intussusception related to that mass. IMPRESSION: 1. Stratified mural thickening involving loops of small bowel wall in the left hemiabdomen with adjacent inflammatory stranding, most consistent with enteritis including infectious/inflammatory and ischemic etiologies. No evidence of bowel obstruction or perforation. 2. Similar small volume abdominopelvic free fluid with mesenteric stranding, no pneumatosis, pneumoperitoneum, or walled off fluid collections. 3. Cholelithiasis in a distended gallbladder with some wall thickening equivocal CT findings for acute cholecystitis. Consider further evaluation with nuclear medicine HIDA scan if clinically indicated. 4. Cardiomegaly with moderate right and small left pleural effusions as well as adjacent atelectasis versus infiltrate. 5. Colonic diverticulosis without findings of acute diverticulitis. 6. Heterogeneous enhancement of an enlarged prostate gland. 7.  Aortic Atherosclerosis (ICD10-I70.0). Electronically Signed   By: Dellis Filbert  Nance Pew M.D.   On: 09/02/2021 16:56   CT Angio Chest PE W/Cm &/Or Wo Cm  Result Date: 09/02/2021 CLINICAL DATA:  Respiratory failure. EXAM: CT ANGIOGRAPHY CHEST WITH CONTRAST TECHNIQUE: Multidetector CT imaging of the chest was performed using the standard protocol during bolus administration of intravenous contrast. Multiplanar CT image reconstructions and MIPs were obtained to evaluate the vascular anatomy. RADIATION DOSE REDUCTION: This exam was performed according to the departmental dose-optimization program which includes automated exposure control, adjustment of the mA and/or kV according to patient size and/or use of iterative reconstruction technique. CONTRAST:  128mL OMNIPAQUE IOHEXOL 350 MG/ML SOLN COMPARISON:  None Available. FINDINGS: Cardiovascular: Satisfactory opacification of the pulmonary arteries to the segmental level. No evidence of pulmonary embolism. Normal heart size. No pericardial effusion. Coronary artery calcifications are noted. Atherosclerosis of thoracic aorta is noted without aneurysm formation. Mediastinum/Nodes: No enlarged mediastinal, hilar, or axillary lymph nodes. Thyroid gland and esophagus demonstrate no significant findings. Possible aspirated material is noted in the trachea. Lungs/Pleura: Lung apices are not included in field-of-view. No definite pneumothorax is noted. Moderate bilateral pleural effusions are noted with adjacent atelectasis of both lower lobes. 3.9 x 2.3 cm cavitary abnormality is noted in left lower lobe concerning for pulmonary abscess or cavitating pneumonia. Upper Abdomen: Cholelithiasis. Musculoskeletal: No chest wall abnormality. No acute or significant osseous findings. Review of the MIP images confirms the above findings. IMPRESSION: No definite evidence of pulmonary embolus. Moderate bilateral pleural effusions are noted with adjacent atelectasis of both lower lobes. Possible aspirated food debris or other material in the trachea. 3.9 x 2.3 cm  cavitary abnormality is noted in left lower lobe concerning for pulmonary abscess or cavitating pneumonia. Coronary artery calcifications are noted. Cholelithiasis. Aortic Atherosclerosis (ICD10-I70.0). Electronically Signed   By: Marijo Conception M.D.   On: 09/02/2021 16:38   DG Chest 2 View  Result Date: 09/02/2021 CLINICAL DATA:  Shortness of breath. EXAM: CHEST - 2 VIEW COMPARISON:  08/27/2021 FINDINGS: There is a left chest wall pacer device with lead in the right ventricle. Stable cardiomediastinal contours. Increased volume of bilateral pleural effusions, right greater than left. Mild interstitial edema. Atelectasis noted within both lung bases. IMPRESSION: 1. Interval increase in CHF pattern with progressive bilateral pleural effusions and interstitial edema. Electronically Signed   By: Kerby Moors M.D.   On: 09/02/2021 11:18   DG Abd 1 View  Result Date: 08/27/2021 CLINICAL DATA:  Ileus EXAM: ABDOMEN - 1 VIEW COMPARISON:  Abdominal radiograph August 25, 2021 FINDINGS: Nasogastric tube tip projects over the stomach. Cardiac pacemaker lead tip projects over the right ventricle. The bowel gas pattern is normal. Thoracic spondylosis. Pelvic phleboliths. Degenerative changes bilateral hips. IMPRESSION: Stable nasogastric tube with nonobstructive bowel gas pattern. Electronically Signed   By: Dahlia Bailiff M.D.   On: 08/27/2021 07:58   DG Chest Port 1 View  Result Date: 08/27/2021 CLINICAL DATA:  Respiratory failure due to hypoxia. EXAM: PORTABLE CHEST 1 VIEW COMPARISON:  Chest Radiograph August 25, 2021 FINDINGS: Endotracheal tube with tip projecting over the midthoracic trachea. Enteric tube coursing below the diaphragm with tip projecting over the stomach. Left IJ CVC with tip projecting over the SVC. Single lead left chest cardiac pacemaker with tip projecting over the right ventricle. Cardiac enlargement and central vascular prominence is similar prior. Similar bilateral interstitial thickening and  patchy airspace opacities. No visible pleural effusion or pneumothorax. No acute osseous abnormality. IMPRESSION: Similar enlarged cardiac silhouette and central vascular prominence, with bilateral interstitial and  alveolar opacities. Electronically Signed   By: Dahlia Bailiff M.D.   On: 08/27/2021 07:57   DG Abd 1 View  Result Date: 08/25/2021 CLINICAL DATA:  NG tube verification. EXAM: ABDOMEN - 1 VIEW COMPARISON:  Radiograph August 24, 2021 FINDINGS: Nasogastric tube tip and side port projecting over the stomach. Left chest pacemaker with unchanged position of the cardiac leads. Bilateral interstitial opacities better seen on same day chest radiograph. The bowel gas pattern is normal. IMPRESSION: Nasogastric tube with tip and side port projecting over the stomach. Electronically Signed   By: Dahlia Bailiff M.D.   On: 08/25/2021 10:19   DG Chest Port 1 View  Result Date: 08/25/2021 CLINICAL DATA:  Respiratory distress EXAM: PORTABLE CHEST 1 VIEW COMPARISON:  08/23/2021 FINDINGS: Nasogastric tube with the tip projecting over the stomach. Single lead cardiac pacemaker. Mild bilateral interstitial thickening and patchy alveolar airspace opacities. No pleural effusion or pneumothorax. Stable cardiomegaly. No acute osseous abnormality. IMPRESSION: 1. Bilateral interstitial and alveolar airspace opacities which may reflect mild pulmonary edema versus multifocal pneumonia. No significant interval change compared with the prior exam. Electronically Signed   By: Kathreen Devoid M.D.   On: 08/25/2021 08:15   ECHOCARDIOGRAM COMPLETE  Result Date: 08/24/2021    ECHOCARDIOGRAM REPORT   Patient Name:   Otila Back Date of Exam: 08/24/2021 Medical Rec #:  [id number]        Height:       70.0 in Accession #:    [id number]       Weight:       187.4 lb Date of Birth:  [birth year]         BSA:          2.030 m Patient Age:    86 years         BP:           98/60 mmHg Patient Gender: M                HR:           60 bpm.  Exam Location:  [facility] Procedure: 2D Echo Indications:     Cardiomyopathy I42.9  History:         Patient has prior history of Echocardiogram examinations, most                  recent 03/25/2018.  Sonographer:     Kathlen Brunswick RDCS Referring Phys:  [id number] [place] L [place] Diagnosing Phys: Skeet Latch MD  Sonographer Comments: Echo performed with patient supine and on artificial respirator. IMPRESSIONS  1. Left ventricular ejection fraction, by estimation, is 25 to 30%. The left ventricle has severely decreased function. The left ventricle demonstrates global hypokinesis. There is moderate concentric left ventricular hypertrophy. Left ventricular diastolic parameters are consistent with Grade II diastolic dysfunction (pseudonormalization). Elevated left atrial pressure.  2. Right ventricular systolic function is normal. The right ventricular size is normal.  3. Left atrial size was severely dilated.  4. Right atrial size was mildly dilated.  5. The mitral valve is normal in structure. Trivial mitral valve regurgitation. No evidence of mitral stenosis.  6. Severely restricted motion of the left and right coronary cusps. Aortic valve area by planimitry is 0.3. Dimensionless index 0.3. Suspect low-flow, low-gradient aortic stenosis. Likely moderate to severe aortic stenosis. The aortic valve is calcified. There is severe calcifcation of the aortic valve. There is severe thickening of the aortic valve. Aortic valve regurgitation is not visualized. No aortic  stenosis is present.  7. Aortic dilatation noted. There is mild dilatation of the aortic root, measuring 38 mm.  8. The inferior vena cava is normal in size with greater than 50% respiratory variability, suggesting right atrial pressure of 3 mmHg. FINDINGS  Left Ventricle: Left ventricular ejection fraction, by estimation, is 25 to 30%. The left ventricle has severely decreased function. The left ventricle demonstrates global hypokinesis. The left  ventricular internal cavity size was normal in size. There is moderate concentric left ventricular hypertrophy. Left ventricular diastolic parameters are consistent with Grade II diastolic dysfunction (pseudonormalization). Elevated left atrial pressure. Right Ventricle: The right ventricular size is normal. No increase in right ventricular wall thickness. Right ventricular systolic function is normal. Left Atrium: Left atrial size was severely dilated. Right Atrium: Right atrial size was mildly dilated. Pericardium: There is no evidence of pericardial effusion. Mitral Valve: The mitral valve is normal in structure. Mild mitral annular calcification. Trivial mitral valve regurgitation. No evidence of mitral valve stenosis. Tricuspid Valve: The tricuspid valve is normal in structure. Tricuspid valve regurgitation is mild . No evidence of tricuspid stenosis. Aortic Valve: Severely restricted motion of the left and right coronary cusps. Aortic valve area by planimitry is 0.3. Dimensionless index 0.3. Suspect low-flow, low-gradient aortic stenosis. Likely moderate to severe aortic stenosis. The aortic valve is  calcified. There is severe calcifcation of the aortic valve. There is severe thickening of the aortic valve. Aortic valve regurgitation is not visualized. No aortic stenosis is present. Aortic valve mean gradient measures 9.0 mmHg. Aortic valve peak gradient measures 15.5 mmHg. Aortic valve area, by VTI measures 1.15 cm. Pulmonic Valve: The pulmonic valve was normal in structure. Pulmonic valve regurgitation is not visualized. No evidence of pulmonic stenosis. Aorta: Aortic dilatation noted. There is mild dilatation of the aortic root, measuring 38 mm. Venous: The inferior vena cava is normal in size with greater than 50% respiratory variability, suggesting right atrial pressure of 3 mmHg. IAS/Shunts: No atrial level shunt detected by color flow Doppler. Additional Comments: A device lead is visualized.  LEFT  VENTRICLE PLAX 2D LVIDd:         4.97 cm      Diastology LVIDs:         4.21 cm      LV e' medial:    3.70 cm/s LV PW:         1.53 cm      LV E/e' medial:  20.5 LV IVS:        1.30 cm      LV e' lateral:   5.00 cm/s LVOT diam:     2.20 cm      LV E/e' lateral: 15.2 LV SV:         46 LV SV Index:   23 LVOT Area:     3.80 cm  LV Volumes (MOD) LV vol d, MOD A2C: 120.0 ml LV vol d, MOD A4C: 160.0 ml LV vol s, MOD A2C: 73.9 ml LV vol s, MOD A4C: 105.0 ml LV SV MOD A2C:     46.1 ml LV SV MOD A4C:     160.0 ml LV SV MOD BP:      53.6 ml RIGHT VENTRICLE RV Basal diam:  3.45 cm RV S prime:     8.49 cm/s TAPSE (M-mode): 1.2 cm LEFT ATRIUM              Index        RIGHT ATRIUM  Index LA diam:        6.30 cm  3.10 cm/m   RA Area:     21.70 cm LA Vol (A2C):   111.0 ml 54.67 ml/m  RA Volume:   64.60 ml  31.82 ml/m LA Vol (A4C):   98.0 ml  48.27 ml/m LA Biplane Vol: 105.0 ml 51.72 ml/m  AORTIC VALVE                    PULMONIC VALVE AV Area (Vmax):    1.07 cm     PV Vmax:       0.81 m/s AV Area (VTI):     1.15 cm     PV Peak grad:  2.7 mmHg AV Vmax:           197.10 cm/s AV VTI:            0.401 m AV Peak Grad:      15.5 mmHg AV Mean Grad:      9.0 mmHg LVOT Vmax:         55.50 cm/s LVOT Vmean:        33.700 cm/s LVOT VTI:          0.121 m LVOT/AV VTI ratio: 0.30  AORTA Ao Root diam: 3.80 cm Ao Asc diam:  3.30 cm MITRAL VALVE               TRICUSPID VALVE MV Area (PHT): 2.62 cm    TV Peak grad:   22.0 mmHg MV Decel Time: 289 msec    TV Vmax:        2.35 m/s MV E velocity: 75.80 cm/s  TR Peak grad:   21.2 mmHg                            TR Vmax:        230.00 cm/s                             SHUNTS                            Systemic VTI:  0.12 m                            Systemic Diam: 2.20 cm Skeet Latch MD Electronically signed by Skeet Latch MD Signature Date/Time: 08/24/2021/1:38:28 PM    Final    DG Abd 2 Views  Result Date: 08/24/2021 CLINICAL DATA:  Follow-up small bowel obstruction. EXAM:  ABDOMEN - 2 VIEW COMPARISON:  08/23/2021 FINDINGS: Enteric tube tip and side port are in the gastric fundus. Gallstones identified in the right upper quadrant of the abdomen. No dilated loops of small or large bowel. Gas identified within the colon up to the rectum. IMPRESSION: 1. Nonobstructive bowel gas pattern. 2. Cholelithiasis. Electronically Signed   By: Kerby Moors M.D.   On: 08/24/2021 07:15   DG Chest Port 1 View  Result Date: 08/23/2021 CLINICAL DATA:  Status post endotracheal tube, central line and orogastric tube placement. EXAM: PORTABLE CHEST 1 VIEW COMPARISON:  August 22, 2021 FINDINGS: Since the prior study, there is been interval endotracheal tube placement with its distal tip approximately 5.5 cm from the carina. Interval orogastric placement is also seen with its distal tip overlying the expected region of the  gastric fundus. A left internal jugular venous catheter is present with its distal tip overlying the mid superior vena cava. This is approximately 3.4 cm proximal to the junction of the superior vena cava and right atrium. Stable single lead ventricular AICD positioning is noted. Mild to moderate severity bilateral multifocal infiltrates are seen. This is increased in severity when compared to the prior exam. No pleural effusion or pneumothorax is identified. The heart size and mediastinal contours are within normal limits. There is moderate severity calcification of the thoracic aorta. The visualized skeletal structures are unremarkable. IMPRESSION: 1. Mild to moderate severity bilateral multifocal infiltrates, increased in severity when compared to the prior exam. 2. Interval endotracheal tube, orogastric tube and left internal jugular venous catheter placement and positioning, as described above. Electronically Signed   By: Aram Candela M.D.   On: 08/23/2021 03:01   DG Abd 1 View  Result Date: 08/23/2021 CLINICAL DATA:  Orogastric tube placement EXAM: ABDOMEN - 1 VIEW  COMPARISON:  None Available. FINDINGS: Tip of the orogastric tube projects over the left hemidiaphragm. Proximal course is not visualized. Could be in the left lower lobe bronchus. IMPRESSION: Orogastric tube tip projecting over the left hemidiaphragm, possibly in the left lower lobe bronchus. Replacement and/or chest radiograph recommended. These results will be called to the ordering clinician or representative by the Radiologist Assistant, and communication documented in the PACS or [employer]. Electronically Signed   By: Deatra Robinson M.D.   On: 08/23/2021 01:07   CT HEAD WO CONTRAST ( )  Result Date: 08/22/2021 CLINICAL DATA:  Sepsis EXAM: CT HEAD WITHOUT CONTRAST TECHNIQUE: Contiguous axial images were obtained from the base of the skull through the vertex without intravenous contrast. RADIATION DOSE REDUCTION: This exam was performed according to the departmental dose-optimization program which includes automated exposure control, adjustment of the mA and/or kV according to patient size and/or use of iterative reconstruction technique. COMPARISON:  None Available. FINDINGS: Brain: Motion degradation on multiple images. No acute territorial infarction, hemorrhage or intracranial mass. Atrophy and chronic small vessel ischemic changes of the white matter. Small chronic infarcts in the right cerebellum. Vascular: No hyperdense vessels.  Carotid vascular calcification. Skull: Normal. Negative for fracture or focal lesion. Sinuses/Orbits: Patchy mucosal thickening in the sinuses Other: None IMPRESSION: 1. Motion degraded study 2. No CT evidence for acute intracranial abnormality. 3. Atrophy and chronic small vessel ischemic changes of the white matter Electronically Signed   By: Jasmine Pang M.D.   On: 08/22/2021 23:53   CT ABDOMEN PELVIS WO CONTRAST  Result Date: 08/22/2021 CLINICAL DATA:  Abdomen pain sepsis EXAM: CT ABDOMEN AND PELVIS WITHOUT CONTRAST TECHNIQUE: Multidetector CT imaging of the  abdomen and pelvis was performed following the standard protocol without IV contrast. RADIATION DOSE REDUCTION: This exam was performed according to the departmental dose-optimization program which includes automated exposure control, adjustment of the mA and/or kV according to patient size and/or use of iterative reconstruction technique. COMPARISON:  None Available. FINDINGS: Lower chest: Lung bases demonstrate heterogeneous consolidation and ground-glass density in the bilateral lower lobes, right middle lobe and lingula, suspicious for pneumonia. Small bilateral pleural effusions. Cardiomegaly. Partially visualized pacing leads. Coronary vascular calcification Hepatobiliary: Gallstones. No focal hepatic abnormality. No biliary dilatation Pancreas: Unremarkable. No pancreatic ductal dilatation or surrounding inflammatory changes. Spleen: Normal in size without focal abnormality. Adrenals/Urinary Tract: Adrenal glands are within normal limits. Kidneys show no hydronephrosis. Multiple low-density lesions, likely cysts. No specific imaging follow-up is recommended. Foley catheter in the bladder  which is decompressed Stomach/Bowel: Marked fluid distension of the stomach. Multiple dilated fluid-filled loops of proximal and mid small bowel, consistent with obstruction. Transition point visualized in the distal small bowel in the left lower quadrant, series 2, image 69 and sagittal series 6 image 87 where there is beaked appearance of the small bowel. Small bowel distal to this within the anterior pelvis is completely decompressed. No acute bowel wall thickening. Frothy stools in the colon. Slight swirling appearance of the mesentery in the region of transition point, series 2, image 64 through 67. Vascular/Lymphatic: Moderate aortic atherosclerosis. No aneurysm. No suspicious lymph nodes. Edema and mesenteric vascular congestion associated with obstructed bowel loops, series 2, image 65. Reproductive: Enlarged  prostate. Other: No free air. Small volume free fluid within the abdomen and pelvis. Generalized subcutaneous edema consistent with anasarca. Musculoskeletal: No acute osseous abnormality IMPRESSION: 1. Marked fluid distension of the stomach with multiple dilated fluid-filled loops of proximal and mid small bowel consistent with obstruction. Point of transition within the left lower quadrant distal small bowel slightly inferior to the umbilicus where there is slightly decreased appearance of the small bowel on sagittal images and slight swirling appearance of the mesentery. Findings could be secondary to internal hernia, incomplete volvulus, or adhesive disease. There is mesenteric vascular congestion associated with obstructed small bowel loops without gross bowel wall thickening or frank intramural air at this time. No free air. Small volume ascites within the abdomen and pelvis. 2. Heterogeneous airspace disease and consolidations at the lung bases, right middle lobe and lingula suspicious for multifocal pneumonia. Potential component of aspiration at the lung bases given findings of bowel obstruction in the abdomen. Suggest NG tube decompression of the stomach 3. Cardiomegaly with small pleural effusions. Generalized subcutaneous edema consistent with anasarca. 4. Enlarged prostate 5. Gallstones Electronically Signed   By: Donavan Foil M.D.   On: 08/22/2021 23:50   DG Chest Portable 1 View  Result Date: 08/22/2021 CLINICAL DATA:  Dyspnea and respiratory distress. EXAM: PORTABLE CHEST 1 VIEW COMPARISON:  05/19/2021. FINDINGS: There is a left chest wall pacer device with lead in the right ventricle. Stable cardiomediastinal contours. Aortic atherosclerosis. No pleural effusion or edema. Bilateral multifocal airspace opacities are identified concerning for multifocal pneumonia. IMPRESSION: Bilateral multifocal airspace opacities concerning for multifocal pneumonia. Electronically Signed   By: Kerby Moors  M.D.   On: 08/22/2021 18:49    Microbiology: Results for orders placed or performed during the hospital encounter of 09/02/21  Blood culture (routine x 2)     Status: None (Preliminary result)   Collection Time: 09/02/21  2:20 PM   Specimen: BLOOD RIGHT ARM  Result Value Ref Range Status   Specimen Description BLOOD RIGHT ARM  Final   Special Requests   Final    BOTTLES DRAWN AEROBIC AND ANAEROBIC Blood Culture adequate volume   Culture   Final    NO GROWTH < 24 HOURS Performed at [facility], [address], [place], [place] [address]    Report Status PENDING  Incomplete  Blood culture (routine x 2)     Status: None (Preliminary result)   Collection Time: 09/02/21  2:35 PM   Specimen: BLOOD LEFT HAND  Result Value Ref Range Status   Specimen Description BLOOD LEFT HAND  Final   Special Requests   Final    BOTTLES DRAWN AEROBIC AND ANAEROBIC Blood Culture adequate volume   Culture   Final    NO GROWTH < 24 HOURS Performed at [facility], [address], [place], [place] [address]    Report Status PENDING  Incomplete  SARS Coronavirus 2 by RT PCR (hospital order, performed in [facility] hospital lab) *cepheid single result test* Anterior Nasal Swab     Status: None   Collection Time: 09/02/21  2:41 PM   Specimen: Anterior Nasal Swab  Result Value Ref Range Status   SARS Coronavirus 2 by RT PCR NEGATIVE NEGATIVE Final    Comment: (NOTE) SARS-CoV-2 target nucleic acids are NOT DETECTED.  The SARS-CoV-2 RNA is generally detectable in upper and lower respiratory specimens during the acute phase of infection. The lowest concentration of SARS-CoV-2 viral copies this assay can detect is 250 copies / mL. A negative result does not preclude SARS-CoV-2 infection and should not be used as the sole basis for treatment or other patient management decisions.  A negative result may occur with improper specimen collection / handling, submission of specimen other than  nasopharyngeal swab, presence of viral mutation(s) within the areas targeted by this assay, and inadequate number of viral copies (<250 copies / mL). A negative result must be combined with clinical observations, patient history, and epidemiological information.  Fact Sheet for Patients:   RoadLapTop.co.za  Fact Sheet for Healthcare Providers: http://kim-miller.com/  This test is not yet approved or  cleared by the [place] FDA and has been authorized for detection and/or diagnosis of SARS-CoV-2 by FDA under an Emergency Use Authorization (EUA).  This EUA will remain in effect (meaning this test can be used) for the duration of the COVID-19 declaration under Section 564(b)(1) of the Act, 21 U.S.C. section 360bbb-3(b)(1), unless the authorization is terminated or revoked sooner.  Performed at [facility], [address] [place], [place] [address]     Labs: CBC: Recent Labs  Lab 08/28/21 [phone] 08/29/21 0513 09/02/21 1233 09/03/21 0825  WBC 7.7 8.0 14.8* 8.1  HGB 8.2* 9.1* 8.1* 8.2*  HCT 25.5* 27.0* 27.0* 26.7*  MCV 93.4 91.2 101.5* 100.4*  PLT 189 171 170 148*   Basic Metabolic Panel: Recent Labs  Lab 08/28/21 0521 08/29/21 0513 08/30/21 0326 09/02/21 1036 09/02/21 2105 09/02/21 2226 09/03/21 0825  NA 139 143  --  151* 151* 152* 154*  K 3.8 3.6  --  4.0 3.9 3.9 3.7  CL 106 110  --  120* 119* 120* 122*  CO2 27 26  --  28 22 26 28   GLUCOSE 212* 157*  --  297* 266* 256* 128*  BUN 70* 65*  --  36* 33* 34* 30*  CREATININE 1.00 0.93  --  0.90 0.89 0.90 0.79  CALCIUM 8.4* 9.0  --  8.8* 8.4* 8.7* 8.7*  MG 2.4 2.4  --   --   --   --   --   PHOS  --  2.7 2.3*  --   --   --   --    Liver Function Tests: No results for input(s): "AST", "ALT", "ALKPHOS", "BILITOT", "PROT", "ALBUMIN" in the last 168 hours. CBG: Recent Labs  Lab 09/03/21 0424 09/03/21 0621 09/03/21 0645 09/03/21 0838 09/03/21 1155  GLUCAP  88 82 79 114* 178*    Discharge time spent: greater than 30 minutes.  Signed: 11/03/21, MD [facility] 09/03/2021

## 2021-09-03 NOTE — TOC Initial Note (Signed)
Transition of Care [facility]) - Initial/Assessment Note    Patient Details  Name: Andrew [name] MRN: [id number] Date of Birth: 01/11/1936  Transition of Care [facility]) CM/SW Contact:    Candie Chroman, LCSW Phone Number: 09/03/2021, 12:48 PM  Clinical Narrative:     Per MD, family is interested in home hospice services. Went by room and met with patient and wife. Wife said after discussion with providers, she feels like the hospice home might be the best option for him. Made referral to Daphene Calamity with [place].             Expected Discharge Plan: Hospice Medical Facility Barriers to Discharge: Other (must enter comment) (Hospice evaluation)   Patient Goals and CMS Choice     Choice offered to / list presented to : Spouse  Expected Discharge Plan and Services Expected Discharge Plan: [place]     Post Acute Care Choice: Hospice Living arrangements for the past 2 months: Single Family Home                                      Prior Living Arrangements/Services Living arrangements for the past 2 months: Single Family Home Lives with:: Spouse Patient language and need for interpreter reviewed:: Yes Do you feel safe going back to the place where you live?: Yes      Need for Family Participation in Patient Care: Yes (Comment) Care giver support system in place?: Yes (comment)   Criminal Activity/Legal Involvement Pertinent to Current Situation/Hospitalization: No - Comment as needed  Activities of Daily Living      Permission Sought/Granted Permission sought to share information with : Facility Sport and exercise psychologist, Family Supports    Share Information with NAME: Urban Naval  Permission granted to share info w AGENCY: [facility]  Permission granted to share info w Relationship: Spouse  Permission granted to share info w Contact Information: [phone]  Emotional Assessment Appearance:: Appears stated age Attitude/Demeanor/Rapport: Unable  to Assess Affect (typically observed): Unable to Assess Orientation: : Oriented to Self Alcohol / Substance Use: Not Applicable Psych Involvement: No (comment)  Admission diagnosis:  Lung abscess ([place]) [J85.2] Aspiration pneumonia ([place]) [J69.0] Aspiration pneumonia of both lungs, unspecified aspiration pneumonia type, unspecified part of lung ([place]) [J69.0] Abscess of left lung with pneumonia, unspecified part of lung ([place]) [J85.1] Sepsis without acute organ dysfunction, due to unspecified organism [facility]) [A41.9] Patient Active Problem List   Diagnosis Date Noted   Goals of care, counseling/discussion    Lung abscess ([place]) 09/02/2021   Aspiration pneumonia ([place]) 09/02/2021   Hypernatremia 09/02/2021   Malnutrition of moderate degree 08/29/2021   Shock ([place])    Acute respiratory failure ([place]) 08/22/2021   AKI (acute kidney injury) ([place]) 05/20/2021   Chronic anticoagulation 05/20/2021   Anemia 05/20/2021   Acute respiratory failure with hypoxia ([place]) 05/20/2021   CAP (community acquired pneumonia) 05/20/2021   SOB (shortness of breath) 05/19/2021   Severe sepsis ([place]) 10/06/2018   Chronic diastolic (congestive) heart failure ([place]) 04/01/2018   HTN (hypertension) 04/01/2018   DM (diabetes mellitus) ([place]) 04/01/2018   Paroxysmal atrial fibrillation ([place]) 04/01/2018   Symptomatic bradycardia 03/25/2018   Acute CHF (congestive heart failure) ([place]) 03/23/2018   PCP:  Baxter Hire, MD Pharmacy:   [facility] [address] [place] [address] [place] [address] Phone: [phone] Fax: [phone]     Social  Determinants of Health (SDOH) Interventions    Readmission Risk Interventions     No data to display

## 2021-09-03 NOTE — Progress Notes (Signed)
       CROSS COVER NOTE  NAME: Andrew [name] MRN: [id number] DOB : 06-13-1935    Date of Service   09/03/2021  HPI/Events of Note   Notified by nursing that 0645 CBG is 79 down from 246 last night. [name] has a PMH of CHF EF 25-30% and BNP on admission was 413. He is currently NPO pending a swallow evaluation  Interventions   Plan: PRN D50 D10 at 50 mL/hr for 6 hours while NPO  Monitor for worsening fluid status/change in respiratory status    This document was prepared using Dragon voice recognition software and may include unintentional dictation errors.  Bishop Limbo DNP, MHA, FNP-BC Nurse Practitioner [facility] Pager [phone]

## 2021-09-03 NOTE — Progress Notes (Signed)
Civil engineer, contracting [facility]) Hospital Liaison Note  Received request from Transitions of Care Manager  Maralyn Sago  for family interest in Hospice Home. Visited patient at bedside and spoke with wife/Betty  to confirm interest and explain services.  Approval for Hospice Home is determined by [facility] MD. Once [facility] MD has determined Hospice Home eligibility, [facility] will update hospital staff and family. Patient has been approved.  Consent forms to be completed with family.  EMS notified of patient D/C and transport arranged for 6 p.m.. TOC/Sarah RN/Keisha and Attending Physician/Dr. Sherryll Burger also notified of transport arrangement.    Please send signed DNR form with patient and RN call report to [phone].    Odette Fraction, MSW [place] [facility] Liaison [phone]

## 2021-09-07 LAB — CULTURE, BLOOD (ROUTINE X 2)
Culture: NO GROWTH
Culture: NO GROWTH
Special Requests: ADEQUATE
Special Requests: ADEQUATE

## 2021-09-10 ENCOUNTER — Other Ambulatory Visit: Payer: Self-pay

## 2021-09-10 NOTE — Patient Outreach (Signed)
[facility]) Care Management  09/10/2021  Andrew [name] 05-21-1935 [id number]   Referral request: *Remote review for [facility] readmission report  [facility] [[facility]]  Accountable Care Organization [ACO] Patient: Andrew [name] Medicare  Reviewed for readmission 09/02/21 and transitioned to Hospice Home per inpatient [facility] notes.  Information sent.  Charlesetta Shanks, RN BSN CCM [facility]  [phone] business mobile phone Toll free office [phone]  Fax number: [phone] [email or web address]

## 2021-09-11 ENCOUNTER — Telehealth: Payer: Self-pay | Admitting: Internal Medicine

## 2021-09-11 NOTE — Telephone Encounter (Signed)
Spouse would like to make office aware that pt passed away on 2022-06-07 10/05/2021. She would like to know what she needs to do with monitor for Pacemaker. Please advise

## 2021-09-11 NOTE — Telephone Encounter (Signed)
Spoke to patients wife and offered my condolences. [employer] tech support # provided and will send a return kit. Appreciative of call. 

## 2021-09-18 ENCOUNTER — Ambulatory Visit: Payer: Medicare HMO | Admitting: Family

## 2021-10-03 DEATH — deceased

## 2023-01-15 IMAGING — CR DG CHEST 2V
2 series · 2 of 2 positions shown · non-contrast
Comparison: Chest radiograph dated 10/07/2018.

CLINICAL DATA: Shortness of breath.

EXAM:
CHEST - 2 VIEW

[chest ap]
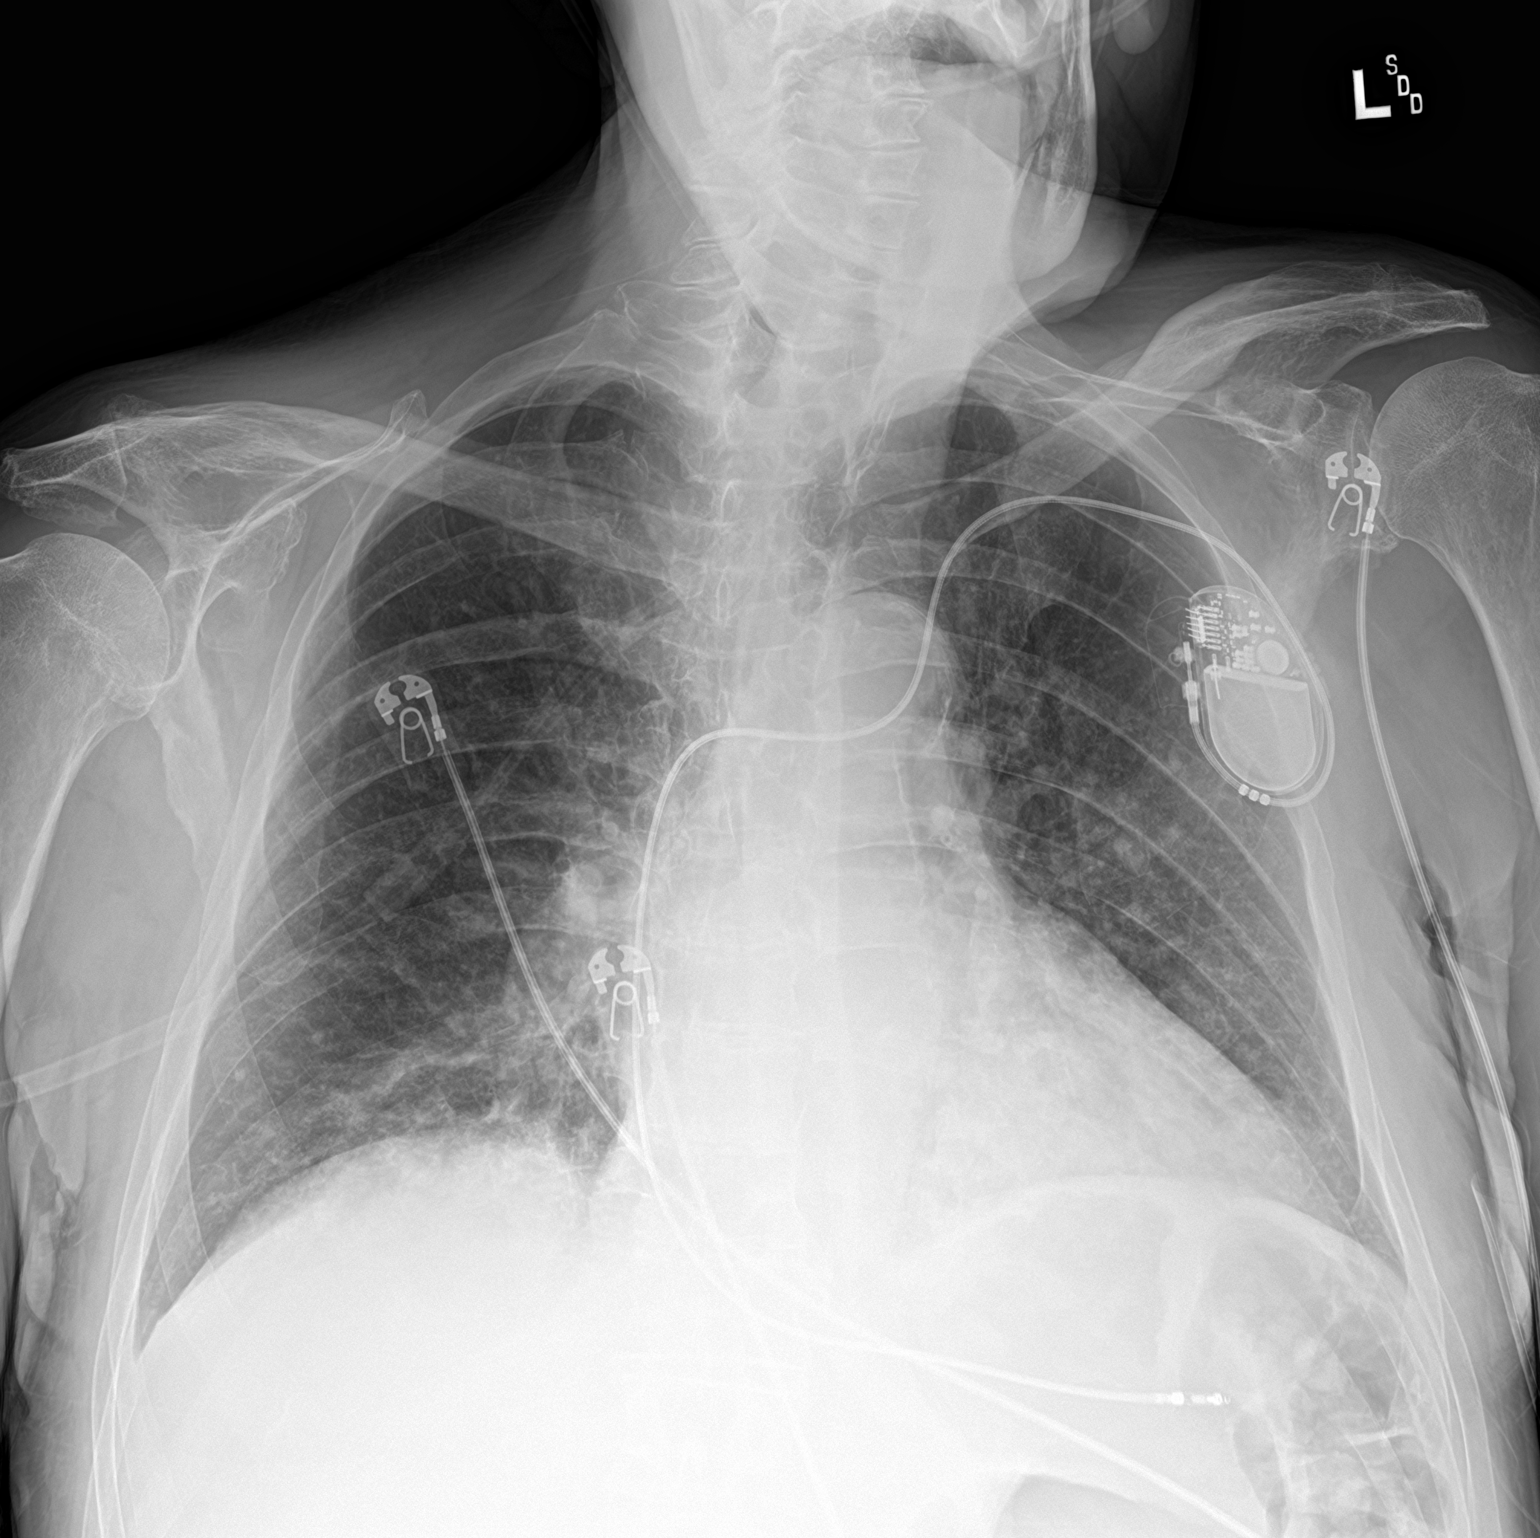

[chest lat]
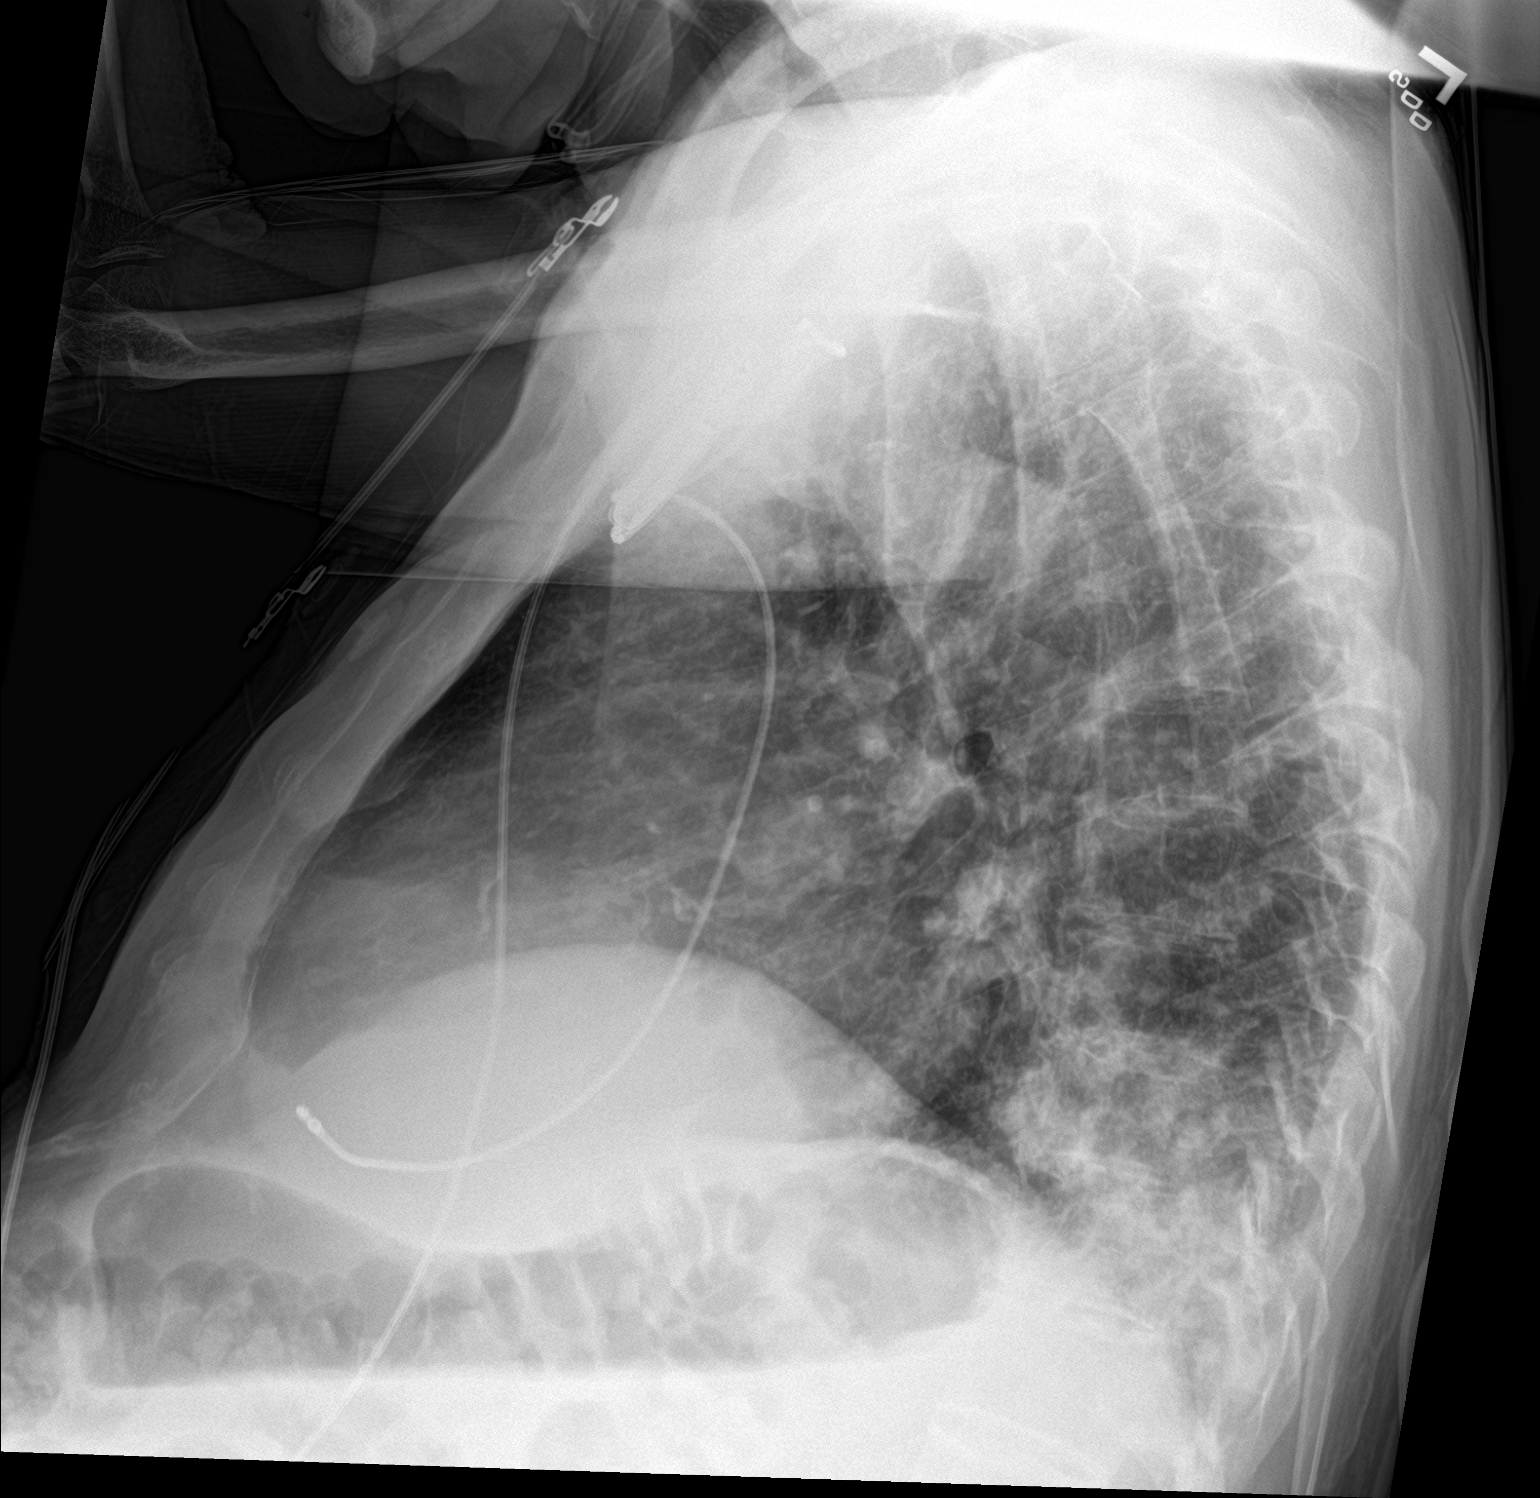

[2 of 2 positions shown; findings below may reference images not displayed]

FINDINGS: Left lung base/retrocardiac opacity most consistent with developing
infiltrate. Clinical correlation and follow-up to resolution
recommended. Minimal right lung base atelectasis or infiltrate. No
pleural effusion or pneumothorax. Mild cardiomegaly. Atherosclerotic
calcification of the aorta. Left pectoral pacemaker device. No acute
osseous pathology.
IMPRESSION: 1. Developing left lower lobe infiltrate.
2. Minimal right lung base atelectasis or infiltrate.
